# Patient Record
Sex: Male | Born: 1960 | Race: White | Hispanic: No | Marital: Single | Smoking: Former smoker
Health system: Southern US, Community
[De-identification: ages and names within clinical notes are randomized; demographics above are authoritative.]

## PROBLEM LIST (undated history)

## (undated) DIAGNOSIS — M1990 Unspecified osteoarthritis, unspecified site: Secondary | ICD-10-CM

## (undated) DIAGNOSIS — I219 Acute myocardial infarction, unspecified: Secondary | ICD-10-CM

## (undated) DIAGNOSIS — I10 Essential (primary) hypertension: Secondary | ICD-10-CM

## (undated) DIAGNOSIS — I2510 Atherosclerotic heart disease of native coronary artery without angina pectoris: Secondary | ICD-10-CM

## (undated) DIAGNOSIS — F209 Schizophrenia, unspecified: Secondary | ICD-10-CM

## (undated) DIAGNOSIS — R0600 Dyspnea, unspecified: Secondary | ICD-10-CM

## (undated) DIAGNOSIS — E119 Type 2 diabetes mellitus without complications: Secondary | ICD-10-CM

## (undated) DIAGNOSIS — K219 Gastro-esophageal reflux disease without esophagitis: Secondary | ICD-10-CM

## (undated) HISTORY — DX: Unspecified osteoarthritis, unspecified site: M19.90

## (undated) HISTORY — DX: Atherosclerotic heart disease of native coronary artery without angina pectoris: I25.10

## (undated) HISTORY — PX: CORONARY ANGIOPLASTY: SHX604

## (undated) HISTORY — PX: FINGER AMPUTATION: SHX636

## (undated) HISTORY — PX: CARDIAC CATHETERIZATION: SHX172

## (undated) HISTORY — DX: Schizophrenia, unspecified: F20.9

## (undated) HISTORY — DX: Type 2 diabetes mellitus without complications: E11.9

## (undated) HISTORY — DX: Essential (primary) hypertension: I10

---

## 2008-10-24 ENCOUNTER — Emergency Department: Payer: Self-pay | Admitting: Emergency Medicine

## 2008-11-09 ENCOUNTER — Encounter: Payer: Self-pay | Admitting: Internal Medicine

## 2008-11-11 ENCOUNTER — Encounter: Payer: Self-pay | Admitting: Internal Medicine

## 2008-11-21 ENCOUNTER — Inpatient Hospital Stay: Payer: Self-pay | Admitting: Psychiatry

## 2013-09-01 ENCOUNTER — Encounter: Payer: Self-pay | Admitting: Podiatry

## 2013-09-09 ENCOUNTER — Ambulatory Visit: Payer: Self-pay | Admitting: Podiatry

## 2013-09-23 ENCOUNTER — Encounter: Payer: Self-pay | Admitting: Podiatry

## 2013-09-23 ENCOUNTER — Ambulatory Visit (INDEPENDENT_AMBULATORY_CARE_PROVIDER_SITE_OTHER): Payer: Medicare Other | Admitting: Podiatry

## 2013-09-23 ENCOUNTER — Ambulatory Visit (INDEPENDENT_AMBULATORY_CARE_PROVIDER_SITE_OTHER): Payer: Medicare Other

## 2013-09-23 VITALS — BP 158/86 | HR 78 | Resp 16 | Ht 68.0 in | Wt 196.0 lb

## 2013-09-23 DIAGNOSIS — M79609 Pain in unspecified limb: Secondary | ICD-10-CM

## 2013-09-23 DIAGNOSIS — B351 Tinea unguium: Secondary | ICD-10-CM

## 2013-09-23 DIAGNOSIS — M79671 Pain in right foot: Secondary | ICD-10-CM

## 2013-09-23 DIAGNOSIS — M79672 Pain in left foot: Secondary | ICD-10-CM

## 2013-09-23 NOTE — Progress Notes (Signed)
   Subjective:    Patient ID: Michael Wall, male    DOB: November 11, 1960, 53 y.o.   MRN: [id number]  HPI Comments: Need to have my toenails trimmed. Thick and hard and painful. Discoloring of the nails .     Review of Systems  Endocrine:       Diabetic  All other systems reviewed and are negative.       Objective:   Physical Exam: I have reviewed his past mental history medications allergies surgeries social history. Vital signs are stable he is alert. Pulses are strongly palpable bilateral. Neurologic sensorium is intact bilateral. Deep tendon reflexes are intact bilateral. Muscle strength intact bilateral. Orthopedic evaluation demonstrates flexible pes planus bilateral. Cutaneous evaluation demonstrates supple well hydrated cutis no erythema edema cellulitis drainage or odor nails are thick yellow dystrophic clinically mycotic. They're also painful palpation as well as debridement.        Assessment & Plan:  Assessment: Pain in limb secondary to onychomycosis 1 through 5 bilateral. Pes planus bilateral non-complicated.  Plan: Debridement of nails 1 through 5 bilateral is a covered service secondary to pain.

## 2013-09-23 NOTE — Patient Instructions (Signed)
Diabetes and Foot Care Diabetes may cause you to have problems because of poor blood supply (circulation) to your feet and legs. This may cause the skin on your feet to become thinner, break easier, and heal more slowly. Your skin may become dry, and the skin may peel and crack. You may also have nerve damage in your legs and feet causing decreased feeling in them. You may not notice minor injuries to your feet that could lead to infections or more serious problems. Taking care of your feet is one of the most important things you can do for yourself.  HOME CARE INSTRUCTIONS  Wear shoes at all times, even in the house. Do not go barefoot. Bare feet are easily injured.  Check your feet daily for blisters, cuts, and redness. If you cannot see the bottom of your feet, use a mirror or ask someone for help.  Wash your feet with warm water (do not use hot water) and mild soap. Then pat your feet and the areas between your toes until they are completely dry. Do not soak your feet as this can dry your skin.  Apply a moisturizing lotion or petroleum jelly (that does not contain alcohol and is unscented) to the skin on your feet and to dry, brittle toenails. Do not apply lotion between your toes.  Trim your toenails straight across. Do not dig under them or around the cuticle. File the edges of your nails with an emery board or nail file.  Do not cut corns or calluses or try to remove them with medicine.  Wear clean socks or stockings every day. Make sure they are not too tight. Do not wear knee-high stockings since they may decrease blood flow to your legs.  Wear shoes that fit properly and have enough cushioning. To break in new shoes, wear them for just a few hours a day. This prevents you from injuring your feet. Always look in your shoes before you put them on to be sure there are no objects inside.  Do not cross your legs. This may decrease the blood flow to your feet.  If you find a minor scrape,  cut, or break in the skin on your feet, keep it and the skin around it clean and dry. These areas may be cleansed with mild soap and water. Do not cleanse the area with peroxide, alcohol, or iodine.  When you remove an adhesive bandage, be sure not to damage the skin around it.  If you have a wound, look at it several times a day to make sure it is healing.  Do not use heating pads or hot water bottles. They may burn your skin. If you have lost feeling in your feet or legs, you may not know it is happening until it is too late.  Make sure your health care provider performs a complete foot exam at least annually or more often if you have foot problems. Report any cuts, sores, or bruises to your health care provider immediately. SEEK MEDICAL CARE IF:   You have an injury that is not healing.  You have cuts or breaks in the skin.  You have an ingrown nail.  You notice redness on your legs or feet.  You feel burning or tingling in your legs or feet.  You have pain or cramps in your legs and feet.  Your legs or feet are numb.  Your feet always feel cold. SEEK IMMEDIATE MEDICAL CARE IF:   There is increasing redness,   swelling, or pain in or around a wound.  There is a red line that goes up your leg.  Pus is coming from a wound.  You develop a fever or as directed by your health care provider.  You notice a bad smell coming from an ulcer or wound. Document Released: 07/27/2000 Document Revised: 04/01/2013 Document Reviewed: 01/06/2013 [employer] Patient Information 2014 [employer].  

## 2013-12-21 ENCOUNTER — Ambulatory Visit (INDEPENDENT_AMBULATORY_CARE_PROVIDER_SITE_OTHER): Payer: Medicare Other | Admitting: Podiatry

## 2013-12-21 VITALS — BP 115/70 | HR 96 | Resp 16

## 2013-12-21 DIAGNOSIS — M79672 Pain in left foot: Secondary | ICD-10-CM

## 2013-12-21 DIAGNOSIS — B351 Tinea unguium: Secondary | ICD-10-CM

## 2013-12-21 DIAGNOSIS — M79609 Pain in unspecified limb: Secondary | ICD-10-CM

## 2013-12-21 DIAGNOSIS — M79671 Pain in right foot: Secondary | ICD-10-CM

## 2013-12-21 NOTE — Progress Notes (Signed)
He presents today with a chief complaint of painful elongated toenails.  Objective: Nails are thick yellow dystrophic with mycotic painful palpation and debridement.  Assessment: Pain in limb secondary to onychomycosis 1 through 5 bilateral.  Plan: Debridement of nails thickness and length as a covered service secondary to pain.

## 2014-03-22 ENCOUNTER — Encounter: Payer: Self-pay | Admitting: Podiatry

## 2014-03-22 ENCOUNTER — Ambulatory Visit (INDEPENDENT_AMBULATORY_CARE_PROVIDER_SITE_OTHER): Payer: Medicare Other | Admitting: Podiatry

## 2014-03-22 DIAGNOSIS — B351 Tinea unguium: Secondary | ICD-10-CM

## 2014-03-22 DIAGNOSIS — M79609 Pain in unspecified limb: Secondary | ICD-10-CM

## 2014-03-22 DIAGNOSIS — M79676 Pain in unspecified toe(s): Secondary | ICD-10-CM

## 2014-03-22 NOTE — Progress Notes (Signed)
He presents today with his aid with a chief complaint of pain to his toenails.  Objective: Vital signs are stable he is alert and oriented x3. His nails are thick yellow dystrophic with mycotic and painful palpation.  Assessment: Pain in limb second onychomycosis 1 through 5 bilateral.  Plan: Debridement of nails 1 through 5 bilateral.

## 2014-06-21 ENCOUNTER — Ambulatory Visit: Payer: Medicare Other | Admitting: Podiatry

## 2014-07-21 ENCOUNTER — Ambulatory Visit (INDEPENDENT_AMBULATORY_CARE_PROVIDER_SITE_OTHER): Payer: Medicare Other | Admitting: Podiatry

## 2014-07-21 DIAGNOSIS — B351 Tinea unguium: Secondary | ICD-10-CM

## 2014-07-21 DIAGNOSIS — M79676 Pain in unspecified toe(s): Secondary | ICD-10-CM

## 2014-07-21 NOTE — Progress Notes (Signed)
He presents today with his aid with a chief complaint of pain to his toenails.  Objective: Vital signs are stable he is alert and oriented x3. His nails are thick yellow dystrophic with mycotic and painful palpation.  Assessment: Pain in limb second onychomycosis 1 through 5 bilateral.  Plan: Debridement of nails 1 through 5 bilateral. 

## 2014-10-25 ENCOUNTER — Other Ambulatory Visit: Payer: Medicare Other

## 2014-11-03 ENCOUNTER — Ambulatory Visit: Payer: Medicare Other

## 2014-11-11 ENCOUNTER — Ambulatory Visit (INDEPENDENT_AMBULATORY_CARE_PROVIDER_SITE_OTHER): Payer: Medicare Other | Admitting: Podiatry

## 2014-11-11 ENCOUNTER — Encounter: Payer: Self-pay | Admitting: Podiatry

## 2014-11-11 DIAGNOSIS — M79676 Pain in unspecified toe(s): Secondary | ICD-10-CM | POA: Diagnosis not present

## 2014-11-11 DIAGNOSIS — B351 Tinea unguium: Secondary | ICD-10-CM | POA: Diagnosis not present

## 2014-11-11 NOTE — Patient Instructions (Signed)
Diabetes and Foot Care Diabetes may cause you to have problems because of poor blood supply (circulation) to your feet and legs. This may cause the skin on your feet to become thinner, break easier, and heal more slowly. Your skin may become dry, and the skin may peel and crack. You may also have nerve damage in your legs and feet causing decreased feeling in them. You may not notice minor injuries to your feet that could lead to infections or more serious problems. Taking care of your feet is one of the most important things you can do for yourself.  HOME CARE INSTRUCTIONS  Wear shoes at all times, even in the house. Do not go barefoot. Bare feet are easily injured.  Check your feet daily for blisters, cuts, and redness. If you cannot see the bottom of your feet, use a mirror or ask someone for help.  Wash your feet with warm water (do not use hot water) and mild soap. Then pat your feet and the areas between your toes until they are completely dry. Do not soak your feet as this can dry your skin.  Apply a moisturizing lotion or petroleum jelly (that does not contain alcohol and is unscented) to the skin on your feet and to dry, brittle toenails. Do not apply lotion between your toes.  Trim your toenails straight across. Do not dig under them or around the cuticle. File the edges of your nails with an emery board or nail file.  Do not cut corns or calluses or try to remove them with medicine.  Wear clean socks or stockings every day. Make sure they are not too tight. Do not wear knee-high stockings since they may decrease blood flow to your legs.  Wear shoes that fit properly and have enough cushioning. To break in new shoes, wear them for just a few hours a day. This prevents you from injuring your feet. Always look in your shoes before you put them on to be sure there are no objects inside.  Do not cross your legs. This may decrease the blood flow to your feet.  If you find a minor scrape,  cut, or break in the skin on your feet, keep it and the skin around it clean and dry. These areas may be cleansed with mild soap and water. Do not cleanse the area with peroxide, alcohol, or iodine.  When you remove an adhesive bandage, be sure not to damage the skin around it.  If you have a wound, look at it several times a day to make sure it is healing.  Do not use heating pads or hot water bottles. They may burn your skin. If you have lost feeling in your feet or legs, you may not know it is happening until it is too late.  Make sure your health care provider performs a complete foot exam at least annually or more often if you have foot problems. Report any cuts, sores, or bruises to your health care provider immediately. SEEK MEDICAL CARE IF:   You have an injury that is not healing.  You have cuts or breaks in the skin.  You have an ingrown nail.  You notice redness on your legs or feet.  You feel burning or tingling in your legs or feet.  You have pain or cramps in your legs and feet.  Your legs or feet are numb.  Your feet always feel cold. SEEK IMMEDIATE MEDICAL CARE IF:   There is increasing redness,   swelling, or pain in or around a wound.  There is a red line that goes up your leg.  Pus is coming from a wound.  You develop a fever or as directed by your health care provider.  You notice a bad smell coming from an ulcer or wound. Document Released: 07/27/2000 Document Revised: 04/01/2013 Document Reviewed: 01/06/2013 [employer] Patient Information 2015 [employer]. This information is not intended to replace advice given to you by your health care provider. Make sure you discuss any questions you have with your health care provider.  

## 2014-11-11 NOTE — Progress Notes (Signed)
Patient ID: Michael Wall, male   DOB: Apr 24, 1961, 54 y.o.   MRN: [id number]  Subjective: 54 y.o.-year-old male returns the office today for painful, elongated, thickened toenails which he is unable to do himself. Denies any redness or drainage around the nails. Denies any acute changes since last appointment and no new complaints today. Denies any systemic complaints such as fevers, chills, nausea, vomiting.   Objective: AAO 3, NAD DP/PT pulses palpable, CRT less than 3 seconds Protective sensation intact with Simms Weinstein monofilament, Achilles tendon reflex intact.  Nails hypertrophic, dystrophic, elongated, brittle, discolored 10. There is tenderness overlying the nails 1-5 bilaterally. There is no surrounding erythema or drainage along the nail sites. No open lesions or pre-ulcerative lesions are identified.  Hammertoe deformities present No other areas of tenderness bilateral lower extremities. No overlying edema, erythema, increased warmth. No pain with calf compression, swelling, warmth, erythema.  Assessment: Patient presents with symptomatic onychomycosis  Plan: -Treatment options including alternatives, risks, complications were discussed -Nails sharply debrided 10 without complication/bleeding. -Discussed daily foot inspection. If there are any changes, to call the office immediately.  -Follow-up in 3 months or sooner if any problems are to arise. In the meantime, encouraged to call the office with any questions, concerns, changes symptoms.

## 2015-02-03 ENCOUNTER — Ambulatory Visit (INDEPENDENT_AMBULATORY_CARE_PROVIDER_SITE_OTHER): Payer: Medicare Other | Admitting: Podiatry

## 2015-02-03 DIAGNOSIS — M79676 Pain in unspecified toe(s): Secondary | ICD-10-CM

## 2015-02-03 DIAGNOSIS — B351 Tinea unguium: Secondary | ICD-10-CM | POA: Diagnosis not present

## 2015-02-03 NOTE — Progress Notes (Signed)
Subjective: 54 y.o.-year-old male returns the office today for painful, elongated, thickened toenails which he is unable to trim himself. Denies any redness or drainage around the nails. Denies any acute changes since last appointment and no new complaints today. Denies any systemic complaints such as fevers, chills, nausea, vomiting.   Objective: AAO 3, NAD DP/PT pulses palpable, CRT less than 3 seconds.  Nails hypertrophic, dystrophic, elongated, brittle, discolored 10. There is tenderness overlying the nails 1-5 bilaterally. There is no surrounding erythema or drainage along the nail sites. On the dorsal aspect of  The left hallux there is a superficial abrasion without any drainage, surrounding erythema, or other signs of infection.  No open lesions or pre-ulcerative lesions are identified. No other areas of tenderness bilateral lower extremities. No overlying edema, erythema, increased warmth. No pain with calf compression, swelling, warmth, erythema.  Assessment: Patient presents with symptomatic onychomycosis; abrasion left hallux  Plan: -Treatment options including alternatives, risks, complications were discussed -Nails sharply debrided 10 without complication/bleeding. -Recommend antibiotic ointment and a band-aid daily to the abrasion. If there are any signs/symptoms of infection to call the office immediately. Follow-up in 2 weeks if not healed.  -Discussed daily foot inspection. If there are any changes, to call the office immediately.  -Follow-up in 3 months or sooner if any problems are to arise. In the meantime, encouraged to call the office with any questions, concerns, changes symptoms.

## 2015-05-10 ENCOUNTER — Ambulatory Visit (INDEPENDENT_AMBULATORY_CARE_PROVIDER_SITE_OTHER): Payer: Medicare Other | Admitting: Podiatry

## 2015-05-10 DIAGNOSIS — M79676 Pain in unspecified toe(s): Secondary | ICD-10-CM

## 2015-05-10 DIAGNOSIS — B351 Tinea unguium: Secondary | ICD-10-CM | POA: Diagnosis not present

## 2015-05-10 NOTE — Progress Notes (Signed)
Subjective: 54 y.o.-year-old male returns the office today for painful, elongated, thickened toenails which he is unable to trim himself. Denies any redness or drainage around the nails. Denies any acute changes since last appointment and no new complaints today. Denies any systemic complaints such as fevers, chills, nausea, vomiting.   Objective: AAO 3, NAD DP/PT pulses palpable, CRT less than 3 seconds.  Nails hypertrophic, dystrophic, elongated, brittle, discolored 10. There is tenderness overlying the nails 1-5 bilaterally. There is no surrounding erythema or drainage along the nail sites.  No open lesions or pre-ulcerative lesions are identified. No other areas of tenderness bilateral lower extremities. No overlying edema, erythema, increased warmth. No pain with calf compression, swelling, warmth, erythema.  Assessment: Patient presents with symptomatic onychomycosis; abrasion left hallux  Plan: -Treatment options including alternatives, risks, complications were discussed -Nails sharply debrided 10 without complication/bleeding. -Discussed daily foot inspection. If there are any changes, to call the office immediately.  -Follow-up in 3 months or sooner if any problems are to arise. In the meantime, encouraged to call the office with any questions, concerns, changes symptoms.   Ovid Curd, DPM

## 2015-08-11 ENCOUNTER — Ambulatory Visit: Payer: Medicare Other

## 2015-08-12 ENCOUNTER — Encounter: Payer: Self-pay | Admitting: Sports Medicine

## 2015-08-12 ENCOUNTER — Ambulatory Visit (INDEPENDENT_AMBULATORY_CARE_PROVIDER_SITE_OTHER): Payer: Medicare Other | Admitting: Sports Medicine

## 2015-08-12 DIAGNOSIS — M79676 Pain in unspecified toe(s): Secondary | ICD-10-CM

## 2015-08-12 DIAGNOSIS — B351 Tinea unguium: Secondary | ICD-10-CM

## 2015-08-12 DIAGNOSIS — E119 Type 2 diabetes mellitus without complications: Secondary | ICD-10-CM

## 2015-08-12 NOTE — Progress Notes (Signed)
Patient ID: Michael Wall, male   DOB: 1961/04/14, 54 y.o.   MRN: [id number] Subjective: Michael Wall is a 54 y.o. male patient with history of type 2 diabetes who presents to office today complaining of long, painful nails  while ambulating in shoes; unable to trim. Patient states that the glucose reading this morning was 118 mg/dl. Patient denies any new changes in medication or new problems. Patient denies any new cramping, numbness, burning or tingling in the legs.  There are no active problems to display for this patient.  Current Outpatient Prescriptions on File Prior to Visit  Medication Sig Dispense Refill  . acetaminophen (TYLENOL) 500 MG tablet Take 500 mg by mouth every 6 (six) hours as needed.    Marland Kitchen aspirin 81 MG EC tablet Take 81 mg by mouth daily. Swallow whole.    . benztropine (COGENTIN) 2 MG tablet Take 2 mg by mouth 2 (two) times daily.    . clopidogrel (PLAVIX) 75 MG tablet Take 75 mg by mouth daily with breakfast.    . diltiazem (CARDIZEM) 60 MG tablet Take 60 mg by mouth 4 (four) times daily.    Marland Kitchen glipiZIDE (GLUCOTROL) 10 MG tablet Take 10 mg by mouth daily before breakfast.    . glucose blood test strip 1 each by Other route as needed for other. Use as instructed    . haloperidol (HALDOL) 0.5 MG tablet Take 0.5 mg by mouth 2 (two) times daily.    . haloperidol (HALDOL) 5 MG tablet Take 5 mg by mouth 2 (two) times daily.    Marland Kitchen lisinopril (PRINIVIL,ZESTRIL) 20 MG tablet Take 20 mg by mouth daily.    . metFORMIN (GLUCOPHAGE-XR) 500 MG 24 hr tablet     . metoprolol (LOPRESSOR) 100 MG tablet     . metoprolol succinate (TOPROL-XL) 100 MG 24 hr tablet Take 100 mg by mouth daily. Take with or immediately following a meal.    . mirtazapine (REMERON) 30 MG tablet Take 30 mg by mouth at bedtime.    . Multiple Vitamin (MULTIVITAMIN) tablet Take 1 tablet by mouth daily.    Marland Kitchen omeprazole (PRILOSEC) 40 MG capsule     . QUEtiapine (SEROQUEL) 25 MG tablet Take 25 mg by mouth at  bedtime.    . saxagliptin HCl (ONGLYZA) 5 MG TABS tablet Take by mouth daily.    . simvastatin (ZOCOR) 20 MG tablet Take 20 mg by mouth daily.    Rolene Arbour BOWEL PREP SOLN      No current facility-administered medications on file prior to visit.   No Known Allergies  Labs: HEMOGLOBIN A1C- No recent labs on file  Objective: General: Patient is awake, alert, and oriented x 3 and in no acute distress.  Integument: Skin is warm, dry and supple bilateral. Nails are tender, long, thickened and  dystrophic with subungual debris, consistent with onychomycosis, 1-5 bilateral. No signs of infection. No open lesions or preulcerative lesions present bilateral. Remaining integument unremarkable.  Vasculature:  Dorsalis Pedis pulse 2/4 bilateral. Posterior Tibial pulse  1/4 bilateral.  Capillary fill time <3 sec 1-5 bilateral. Positive hair growth to the level of the digits. Temperature gradient within normal limits. No varicosities present bilateral. No edema present bilateral.   Neurology: The patient has intact sensation measured with a 5.07/10g Semmes Weinstein Monofilament at all pedal sites bilateral . Vibratory sensation intact bilateral with tuning fork. No Babinski sign present bilateral.   Musculoskeletal: Asymptomatic mild hammertoe gross pedal deformities noted bilateral. Muscular strength  5/5 in all lower extremity muscular groups bilateral without pain or limitation on range of motion . No tenderness with calf compression bilateral.  Assessment and Plan: Problem List Items Addressed This Visit    None    Visit Diagnoses    Dermatophytosis of nail    -  Primary    Pain of toe, unspecified laterality        Diabetes mellitus without complication (HCC)          -Examined patient. -Discussed and educated patient on diabetic foot care, especially with  regards to the vascular, neurological and musculoskeletal systems.  -Stressed the importance of good glycemic control and the  detriment of not  controlling glucose levels in relation to the foot. -Mechanically debrided all nails 1-5 bilateral using sterile nail nipper and filed with dremel without incident  -Answered all patient questions -Patient to return as needed or in 3 months for at risk foot care -Patient advised to call the office if any problems or questions arise in the meantime.  Asencion Islam, DPM

## 2015-11-11 ENCOUNTER — Ambulatory Visit (INDEPENDENT_AMBULATORY_CARE_PROVIDER_SITE_OTHER): Payer: Medicare Other | Admitting: Sports Medicine

## 2015-11-11 ENCOUNTER — Encounter: Payer: Self-pay | Admitting: Sports Medicine

## 2015-11-11 DIAGNOSIS — B351 Tinea unguium: Secondary | ICD-10-CM | POA: Diagnosis not present

## 2015-11-11 DIAGNOSIS — M79676 Pain in unspecified toe(s): Secondary | ICD-10-CM | POA: Diagnosis not present

## 2015-11-11 DIAGNOSIS — E119 Type 2 diabetes mellitus without complications: Secondary | ICD-10-CM | POA: Diagnosis not present

## 2015-11-11 NOTE — Progress Notes (Signed)
Patient ID: Michael Wall, male   DOB: October 26, 1960, 55 y.o.   MRN: [id number]  Subjective: Michael Wall is a 55 y.o. male patient with history of type 2 diabetes who presents to office today complaining of long, painful nails  while ambulating in shoes; unable to trim. Patient states that the glucose reading this morning was recorded at his living facility but does not recall be number. Patient denies any new changes in medication or new problems. Patient denies any new cramping, numbness, burning or tingling in the legs.  Patient is assisted by caregiver from his living facility.   There are no active problems to display for this patient.  Current Outpatient Prescriptions on File Prior to Visit  Medication Sig Dispense Refill  . acetaminophen (TYLENOL) 500 MG tablet Take 500 mg by mouth every 6 (six) hours as needed.    Marland Kitchen aspirin 81 MG EC tablet Take 81 mg by mouth daily. Swallow whole.    . benztropine (COGENTIN) 2 MG tablet Take 2 mg by mouth 2 (two) times daily.    . clopidogrel (PLAVIX) 75 MG tablet Take 75 mg by mouth daily with breakfast.    . diltiazem (CARDIZEM) 60 MG tablet Take 60 mg by mouth 4 (four) times daily.    Marland Kitchen glipiZIDE (GLUCOTROL) 10 MG tablet Take 10 mg by mouth daily before breakfast.    . glucose blood test strip 1 each by Other route as needed for other. Use as instructed    . haloperidol (HALDOL) 0.5 MG tablet Take 0.5 mg by mouth 2 (two) times daily.    . haloperidol (HALDOL) 5 MG tablet Take 5 mg by mouth 2 (two) times daily.    Marland Kitchen lisinopril (PRINIVIL,ZESTRIL) 20 MG tablet Take 20 mg by mouth daily.    . metFORMIN (GLUCOPHAGE-XR) 500 MG 24 hr tablet     . metoprolol (LOPRESSOR) 100 MG tablet     . metoprolol succinate (TOPROL-XL) 100 MG 24 hr tablet Take 100 mg by mouth daily. Take with or immediately following a meal.    . mirtazapine (REMERON) 30 MG tablet Take 30 mg by mouth at bedtime.    . Multiple Vitamin (MULTIVITAMIN) tablet Take 1 tablet by mouth  daily.    Marland Kitchen omeprazole (PRILOSEC) 40 MG capsule     . QUEtiapine (SEROQUEL) 25 MG tablet Take 25 mg by mouth at bedtime.    . saxagliptin HCl (ONGLYZA) 5 MG TABS tablet Take by mouth daily.    . simvastatin (ZOCOR) 20 MG tablet Take 20 mg by mouth daily.    Rolene Arbour BOWEL PREP SOLN      No current facility-administered medications on file prior to visit.   No Known Allergies  Labs: HEMOGLOBIN A1C- No recent labs on file  Objective: General: Patient is awake, alert, and oriented x 3 and in no acute distress.  Integument: Skin is warm, dry and supple bilateral. Nails are tender, long, thickened and  dystrophic with subungual debris, consistent with onychomycosis, 1-5 bilateral. No signs of infection. No open lesions or preulcerative lesions present bilateral. Remaining integument unremarkable.  Vasculature:  Dorsalis Pedis pulse 2/4 bilateral. Posterior Tibial pulse  1/4 bilateral.  Capillary fill time <3 sec 1-5 bilateral. Positive hair growth to the level of the digits. Temperature gradient within normal limits. No varicosities present bilateral. No edema present bilateral.   Neurology: The patient has intact sensation measured with a 5.07/10g Semmes Weinstein Monofilament at all pedal sites bilateral . Vibratory sensation intact bilateral  with tuning fork. No Babinski sign present bilateral.   Musculoskeletal: Asymptomatic mild hammertoe gross pedal deformities noted bilateral. Muscular strength 5/5 in all lower extremity muscular groups bilateral without pain or limitation on range of motion . No tenderness with calf compression bilateral.  Assessment and Plan: Problem List Items Addressed This Visit    None    Visit Diagnoses    Dermatophytosis of nail    -  Primary    Pain of toe, unspecified laterality        Diabetes mellitus without complication (HCC)          -Examined patient. -Discussed and educated patient on diabetic foot care, especially with  regards to the  vascular, neurological and musculoskeletal systems.  -Stressed the importance of good glycemic control and the detriment of not  controlling glucose levels in relation to the foot. -Mechanically debrided all nails 1-5 bilateral using sterile nail nipper and filed with dremel without incident -Recommend good supportive shoes daily for foot type  -Answered all patient questions -Patient to return in 3 months for at risk foot care -Patient advised to call the office if any problems or questions arise in the meantime.  Asencion Islam, DPM

## 2016-02-10 ENCOUNTER — Ambulatory Visit (INDEPENDENT_AMBULATORY_CARE_PROVIDER_SITE_OTHER): Payer: Medicare Other | Admitting: Sports Medicine

## 2016-02-10 ENCOUNTER — Encounter: Payer: Self-pay | Admitting: Sports Medicine

## 2016-02-10 DIAGNOSIS — M79676 Pain in unspecified toe(s): Secondary | ICD-10-CM | POA: Diagnosis not present

## 2016-02-10 DIAGNOSIS — B351 Tinea unguium: Secondary | ICD-10-CM

## 2016-02-10 DIAGNOSIS — E119 Type 2 diabetes mellitus without complications: Secondary | ICD-10-CM | POA: Diagnosis not present

## 2016-02-10 NOTE — Progress Notes (Signed)
Patient ID: Michael Wall, male   DOB: 07-24-1961, 55 y.o.   MRN: [id number]   Subjective: Michael Wall is a 55 y.o. male patient with history of type 2 diabetes who presents to office today complaining of long, painful nails  while ambulating in shoes; unable to trim. Patient states that the glucose reading this morning was recorded at his living facility but does not recall be number. Patient denies any new changes in medication or new problems. Patient denies any new cramping, numbness, burning or tingling in the legs.  Patient is assisted by caregiver from his living facility who is waiting for him in the lobby.   There are no active problems to display for this patient.  Current Outpatient Prescriptions on File Prior to Visit  Medication Sig Dispense Refill  . acetaminophen (TYLENOL) 500 MG tablet Take 500 mg by mouth every 6 (six) hours as needed.    Marland Kitchen aspirin 81 MG EC tablet Take 81 mg by mouth daily. Swallow whole.    . benztropine (COGENTIN) 2 MG tablet Take 2 mg by mouth 2 (two) times daily.    . clopidogrel (PLAVIX) 75 MG tablet Take 75 mg by mouth daily with breakfast.    . diltiazem (CARDIZEM) 60 MG tablet Take 60 mg by mouth 4 (four) times daily.    Marland Kitchen glipiZIDE (GLUCOTROL) 10 MG tablet Take 10 mg by mouth daily before breakfast.    . glucose blood test strip 1 each by Other route as needed for other. Use as instructed    . haloperidol (HALDOL) 0.5 MG tablet Take 0.5 mg by mouth 2 (two) times daily.    . haloperidol (HALDOL) 5 MG tablet Take 5 mg by mouth 2 (two) times daily.    Marland Kitchen lisinopril (PRINIVIL,ZESTRIL) 20 MG tablet Take 20 mg by mouth daily.    . metFORMIN (GLUCOPHAGE-XR) 500 MG 24 hr tablet     . metoprolol (LOPRESSOR) 100 MG tablet     . metoprolol succinate (TOPROL-XL) 100 MG 24 hr tablet Take 100 mg by mouth daily. Take with or immediately following a meal.    . mirtazapine (REMERON) 30 MG tablet Take 30 mg by mouth at bedtime.    . Multiple Vitamin  (MULTIVITAMIN) tablet Take 1 tablet by mouth daily.    Marland Kitchen omeprazole (PRILOSEC) 40 MG capsule     . QUEtiapine (SEROQUEL) 25 MG tablet Take 25 mg by mouth at bedtime.    . saxagliptin HCl (ONGLYZA) 5 MG TABS tablet Take by mouth daily.    . simvastatin (ZOCOR) 20 MG tablet Take 20 mg by mouth daily.    Rolene Arbour BOWEL PREP SOLN      No current facility-administered medications on file prior to visit.   No Known Allergies  Labs: HEMOGLOBIN A1C- No recent labs on file  Objective: General: Patient is awake, alert, and oriented x 3 and in no acute distress.  Integument: Skin is warm, dry and supple bilateral. Nails are tender, long, thickened and  dystrophic with subungual debris, consistent with onychomycosis, 1-5 bilateral. No signs of infection. No open lesions or preulcerative lesions present bilateral. Remaining integument unremarkable.  Vasculature:  Dorsalis Pedis pulse 2/4 bilateral. Posterior Tibial pulse  1/4 bilateral.  Capillary fill time <3 sec 1-5 bilateral. Positive hair growth to the level of the digits. Temperature gradient within normal limits. No varicosities present bilateral. No edema present bilateral.   Neurology: The patient has intact sensation measured with a 5.07/10g Semmes Weinstein Monofilament at  all pedal sites bilateral . Vibratory sensation intact bilateral with tuning fork. No Babinski sign present bilateral.   Musculoskeletal: Asymptomatic mild hammertoe gross pedal deformities noted bilateral. Muscular strength 5/5 in all lower extremity muscular groups bilateral without pain or limitation on range of motion . No tenderness with calf compression bilateral.  Assessment and Plan: Problem List Items Addressed This Visit    None    Visit Diagnoses    Dermatophytosis of nail    -  Primary    Pain of toe, unspecified laterality        Diabetes mellitus without complication (HCC)          -Examined patient. -Discussed and educated patient on diabetic foot  care, especially with  regards to the vascular, neurological and musculoskeletal systems.  -Stressed the importance of good glycemic control and the detriment of not  controlling glucose levels in relation to the foot. -Mechanically debrided all nails 1-5 bilateral using sterile nail nipper and filed with dremel without incident -Recommend good supportive shoes daily for foot type  -Answered all patient questions -Patient to return in 3 months for at risk foot care -Patient advised to call the office if any problems or questions arise in the meantime.  Asencion Islam, DPM

## 2016-06-29 ENCOUNTER — Ambulatory Visit (INDEPENDENT_AMBULATORY_CARE_PROVIDER_SITE_OTHER): Payer: Medicare Other | Admitting: Podiatry

## 2016-06-29 ENCOUNTER — Encounter: Payer: Self-pay | Admitting: Podiatry

## 2016-06-29 DIAGNOSIS — L608 Other nail disorders: Secondary | ICD-10-CM

## 2016-06-29 DIAGNOSIS — M79609 Pain in unspecified limb: Secondary | ICD-10-CM | POA: Diagnosis not present

## 2016-06-29 DIAGNOSIS — L603 Nail dystrophy: Secondary | ICD-10-CM

## 2016-06-29 DIAGNOSIS — B351 Tinea unguium: Secondary | ICD-10-CM | POA: Diagnosis not present

## 2016-06-29 DIAGNOSIS — E0843 Diabetes mellitus due to underlying condition with diabetic autonomic (poly)neuropathy: Secondary | ICD-10-CM

## 2016-07-15 NOTE — Progress Notes (Signed)
SUBJECTIVE Patient with a history of diabetes mellitus presents to office today complaining of elongated, thickened nails. Pain while ambulating in shoes. Patient is unable to trim their own nails.   No Known Allergies  OBJECTIVE General Patient is awake, alert, and oriented x 3 and in no acute distress. Derm Skin is dry and supple bilateral. Negative open lesions or macerations. Remaining integument unremarkable. Nails are tender, long, thickened and dystrophic with subungual debris, consistent with onychomycosis, 1-5 bilateral. No signs of infection noted. Vasc  DP and PT pedal pulses palpable bilaterally. Temperature gradient within normal limits.  Neuro Epicritic and protective threshold sensation diminished bilaterally.  Musculoskeletal Exam No symptomatic pedal deformities noted bilateral. Muscular strength within normal limits.  ASSESSMENT 1. Diabetes Mellitus w/ peripheral neuropathy 2. Onychomycosis of nail due to dermatophyte bilateral 3. Pain in foot bilateral  PLAN OF CARE 1. Patient evaluated today. 2. Instructed to maintain good pedal hygiene and foot care. Stressed importance of controlling blood sugar.  3. Mechanical debridement of nails 1-5 bilaterally performed using a nail nipper. Filed with dremel without incident.  4. Return to clinic in 3 mos.    Taisa Deloria M Aldine Grainger, DPM   

## 2016-10-02 ENCOUNTER — Encounter: Payer: Self-pay | Admitting: Podiatry

## 2016-10-02 ENCOUNTER — Ambulatory Visit (INDEPENDENT_AMBULATORY_CARE_PROVIDER_SITE_OTHER): Payer: Medicare Other | Admitting: Podiatry

## 2016-10-02 DIAGNOSIS — L608 Other nail disorders: Secondary | ICD-10-CM | POA: Diagnosis not present

## 2016-10-02 DIAGNOSIS — L603 Nail dystrophy: Secondary | ICD-10-CM | POA: Diagnosis not present

## 2016-10-02 DIAGNOSIS — E0843 Diabetes mellitus due to underlying condition with diabetic autonomic (poly)neuropathy: Secondary | ICD-10-CM

## 2016-10-02 DIAGNOSIS — M79609 Pain in unspecified limb: Secondary | ICD-10-CM | POA: Diagnosis not present

## 2016-10-02 DIAGNOSIS — B351 Tinea unguium: Secondary | ICD-10-CM | POA: Diagnosis not present

## 2016-10-02 NOTE — Progress Notes (Signed)
   SUBJECTIVE Patient with a history of diabetes mellitus presents to office today complaining of elongated, thickened nails. Pain while ambulating in shoes. Patient is unable to trim their own nails.   OBJECTIVE General Patient is awake, alert, and oriented x 3 and in no acute distress. Derm Skin is dry and supple bilateral. Negative open lesions or macerations. Remaining integument unremarkable. Nails are tender, long, thickened and dystrophic with subungual debris, consistent with onychomycosis, 1-5 bilateral. No signs of infection noted. Vasc  DP and PT pedal pulses palpable bilaterally. Temperature gradient within normal limits.  Neuro Epicritic and protective threshold sensation diminished bilaterally.  Musculoskeletal Exam No symptomatic pedal deformities noted bilateral. Muscular strength within normal limits.  ASSESSMENT 1. Diabetes Mellitus w/ peripheral neuropathy 2. Onychomycosis of nail due to dermatophyte bilateral 3. Pain in foot bilateral  PLAN OF CARE 1. Patient evaluated today. 2. Instructed to maintain good pedal hygiene and foot care. Stressed importance of controlling blood sugar.  3. Mechanical debridement of nails 1-5 bilaterally performed using a nail nipper. Filed with dremel without incident.  4. Return to clinic in 3 mos.     Keshonna Valvo M. Cameran Pettey, DPM [facility]  Dr. Helder Crisafulli M. Emelyn Roen, DPM    [address]                                        [place], [place] [address]                Office [phone]  Fax [phone]       

## 2017-01-10 ENCOUNTER — Ambulatory Visit (INDEPENDENT_AMBULATORY_CARE_PROVIDER_SITE_OTHER): Payer: Medicare Other | Admitting: Podiatry

## 2017-01-10 ENCOUNTER — Encounter: Payer: Self-pay | Admitting: Podiatry

## 2017-01-10 DIAGNOSIS — E119 Type 2 diabetes mellitus without complications: Secondary | ICD-10-CM

## 2017-01-10 DIAGNOSIS — B351 Tinea unguium: Secondary | ICD-10-CM | POA: Diagnosis not present

## 2017-01-10 DIAGNOSIS — M79609 Pain in unspecified limb: Secondary | ICD-10-CM

## 2017-01-10 NOTE — Progress Notes (Signed)
Complaint:  Visit Type: Patient returns to my office for continued preventative foot care services. Complaint: Patient states" my nails have grown long and thick and become painful to walk and wear shoes" Patient has been diagnosed with DM with no foot complications. The patient presents for preventative foot care services. No changes to ROS  Podiatric Exam: Vascular: dorsalis pedis and posterior tibial pulses are palpable bilateral. Capillary return is immediate. Temperature gradient is WNL. Skin turgor WNL  Sensorium: Diminished  Semmes Weinstein monofilament test. Normal tactile sensation bilaterally. Nail Exam: Pt has thick disfigured discolored nails with subungual debris noted bilateral entire nail hallux through fifth toenails Ulcer Exam: There is no evidence of ulcer or pre-ulcerative changes or infection. Orthopedic Exam: Muscle tone and strength are WNL. No limitations in general ROM. No crepitus or effusions noted. Foot type and digits show no abnormalities. Bony prominences are unremarkable. Skin: No Porokeratosis. No infection or ulcers  Diagnosis:  Onychomycosis, , Pain in right toe, pain in left toes  Treatment & Plan Procedures and Treatment: Consent by patient was obtained for treatment procedures. The patient understood the discussion of treatment and procedures well. All questions were answered thoroughly reviewed. Debridement of mycotic and hypertrophic toenails, 1 through 5 bilateral and clearing of subungual debris. No ulceration, no infection noted.  Return Visit-Office Procedure: Patient instructed to return to the office for a follow up visit 3 months for continued evaluation and treatment.    Hailley Byers DPM 

## 2017-02-25 DIAGNOSIS — R0600 Dyspnea, unspecified: Secondary | ICD-10-CM | POA: Insufficient documentation

## 2017-02-25 DIAGNOSIS — K219 Gastro-esophageal reflux disease without esophagitis: Secondary | ICD-10-CM | POA: Insufficient documentation

## 2017-02-25 DIAGNOSIS — Z8679 Personal history of other diseases of the circulatory system: Secondary | ICD-10-CM | POA: Insufficient documentation

## 2017-02-25 DIAGNOSIS — Z8639 Personal history of other endocrine, nutritional and metabolic disease: Secondary | ICD-10-CM | POA: Insufficient documentation

## 2017-02-25 DIAGNOSIS — I219 Acute myocardial infarction, unspecified: Secondary | ICD-10-CM | POA: Insufficient documentation

## 2017-04-18 ENCOUNTER — Encounter: Payer: Self-pay | Admitting: Podiatry

## 2017-04-18 ENCOUNTER — Ambulatory Visit (INDEPENDENT_AMBULATORY_CARE_PROVIDER_SITE_OTHER): Payer: Medicare Other | Admitting: Podiatry

## 2017-04-18 DIAGNOSIS — B351 Tinea unguium: Secondary | ICD-10-CM

## 2017-04-18 DIAGNOSIS — M79609 Pain in unspecified limb: Secondary | ICD-10-CM

## 2017-04-18 DIAGNOSIS — E119 Type 2 diabetes mellitus without complications: Secondary | ICD-10-CM

## 2017-04-18 NOTE — Progress Notes (Signed)
Complaint:  Visit Type: Patient returns to my office for continued preventative foot care services. Complaint: Patient states" my nails have grown long and thick and become painful to walk and wear shoes" Patient has been diagnosed with DM with no foot complications. The patient presents for preventative foot care services. No changes to ROS  Podiatric Exam: Vascular: dorsalis pedis and posterior tibial pulses are palpable bilateral. Capillary return is immediate. Temperature gradient is WNL. Skin turgor WNL  Sensorium: Diminished  Semmes Weinstein monofilament test. Normal tactile sensation bilaterally. Nail Exam: Pt has thick disfigured discolored nails with subungual debris noted bilateral entire nail hallux through fifth toenails Ulcer Exam: There is no evidence of ulcer or pre-ulcerative changes or infection. Orthopedic Exam: Muscle tone and strength are WNL. No limitations in general ROM. No crepitus or effusions noted. Foot type and digits show no abnormalities. Bony prominences are unremarkable. Skin: No Porokeratosis. No infection or ulcers  Diagnosis:  Onychomycosis, , Pain in right toe, pain in left toes  Treatment & Plan Procedures and Treatment: Consent by patient was obtained for treatment procedures. The patient understood the discussion of treatment and procedures well. All questions were answered thoroughly reviewed. Debridement of mycotic and hypertrophic toenails, 1 through 5 bilateral and clearing of subungual debris. No ulceration, no infection noted.  Return Visit-Office Procedure: Patient instructed to return to the office for a follow up visit 3 months for continued evaluation and treatment.    Mattison Golay DPM 

## 2017-05-24 ENCOUNTER — Encounter: Payer: Self-pay | Admitting: *Deleted

## 2017-05-27 ENCOUNTER — Encounter: Payer: Self-pay | Admitting: *Deleted

## 2017-05-27 ENCOUNTER — Ambulatory Visit: Payer: Medicare Other | Admitting: Anesthesiology

## 2017-05-27 ENCOUNTER — Encounter
Admission: RE | Disposition: A | Discharge status: Home / Self Care | Payer: Self-pay | Source: Ambulatory Visit | Attending: Unknown Physician Specialty

## 2017-05-27 ENCOUNTER — Ambulatory Visit
Admission: RE | Admit: 2017-05-27 | Discharge: 2017-05-27 | Disposition: A | Discharge status: Home / Self Care | Payer: Medicare Other | Source: Ambulatory Visit | Attending: Unknown Physician Specialty | Admitting: Unknown Physician Specialty

## 2017-05-27 DIAGNOSIS — Z7984 Long term (current) use of oral hypoglycemic drugs: Secondary | ICD-10-CM | POA: Insufficient documentation

## 2017-05-27 DIAGNOSIS — I252 Old myocardial infarction: Secondary | ICD-10-CM | POA: Insufficient documentation

## 2017-05-27 DIAGNOSIS — I10 Essential (primary) hypertension: Secondary | ICD-10-CM | POA: Insufficient documentation

## 2017-05-27 DIAGNOSIS — M1990 Unspecified osteoarthritis, unspecified site: Secondary | ICD-10-CM | POA: Insufficient documentation

## 2017-05-27 DIAGNOSIS — K219 Gastro-esophageal reflux disease without esophagitis: Secondary | ICD-10-CM | POA: Insufficient documentation

## 2017-05-27 DIAGNOSIS — E119 Type 2 diabetes mellitus without complications: Secondary | ICD-10-CM | POA: Insufficient documentation

## 2017-05-27 DIAGNOSIS — Z87891 Personal history of nicotine dependence: Secondary | ICD-10-CM | POA: Insufficient documentation

## 2017-05-27 DIAGNOSIS — I2510 Atherosclerotic heart disease of native coronary artery without angina pectoris: Secondary | ICD-10-CM | POA: Insufficient documentation

## 2017-05-27 DIAGNOSIS — Z79899 Other long term (current) drug therapy: Secondary | ICD-10-CM | POA: Insufficient documentation

## 2017-05-27 DIAGNOSIS — Z7982 Long term (current) use of aspirin: Secondary | ICD-10-CM | POA: Diagnosis not present

## 2017-05-27 DIAGNOSIS — R195 Other fecal abnormalities: Secondary | ICD-10-CM | POA: Diagnosis present

## 2017-05-27 DIAGNOSIS — F209 Schizophrenia, unspecified: Secondary | ICD-10-CM | POA: Diagnosis not present

## 2017-05-27 HISTORY — DX: Acute myocardial infarction, unspecified: I21.9

## 2017-05-27 HISTORY — DX: Gastro-esophageal reflux disease without esophagitis: K21.9

## 2017-05-27 HISTORY — PX: COLONOSCOPY WITH PROPOFOL: SHX5780

## 2017-05-27 HISTORY — DX: Dyspnea, unspecified: R06.00

## 2017-05-27 LAB — GLUCOSE, CAPILLARY: GLUCOSE-CAPILLARY: 173 mg/dL — AB (ref 65–99)

## 2017-05-27 SURGERY — COLONOSCOPY WITH PROPOFOL
Anesthesia: General

## 2017-05-27 MED ORDER — SODIUM CHLORIDE 0.9 % IV SOLN
INTRAVENOUS | Status: DC
  Administered 2017-05-27: 09:00:00 via INTRAVENOUS

## 2017-05-27 MED ORDER — PROPOFOL 10 MG/ML IV BOLUS
INTRAVENOUS | Status: DC | PRN
  Administered 2017-05-27: 70 mg via INTRAVENOUS

## 2017-05-27 MED ORDER — PROPOFOL 500 MG/50ML IV EMUL
INTRAVENOUS | Status: AC
  Filled 2017-05-27: qty 50

## 2017-05-27 MED ORDER — LIDOCAINE HCL (CARDIAC) 20 MG/ML IV SOLN
INTRAVENOUS | Status: DC | PRN
  Administered 2017-05-27: 40 mg via INTRAVENOUS

## 2017-05-27 MED ORDER — PROPOFOL 500 MG/50ML IV EMUL
INTRAVENOUS | Status: DC | PRN
  Administered 2017-05-27: 140 ug/kg/min via INTRAVENOUS

## 2017-05-27 MED ORDER — SODIUM CHLORIDE 0.9 % IV SOLN: INTRAVENOUS | Status: DC

## 2017-05-27 NOTE — Anesthesia Postprocedure Evaluation (Signed)
Anesthesia Post Note  Patient: Michael Wall  Procedure(s) Performed: COLONOSCOPY WITH PROPOFOL (N/A )  Patient location during evaluation: Endoscopy Anesthesia Type: General Level of consciousness: awake and alert and oriented Pain management: pain level controlled Vital Signs Assessment: post-procedure vital signs reviewed and stable Respiratory status: spontaneous breathing, nonlabored ventilation and respiratory function stable Cardiovascular status: blood pressure returned to baseline and stable Postop Assessment: no signs of nausea or vomiting Anesthetic complications: no     Last Vitals:  Vitals:   05/27/17 0934 05/27/17 0944  BP: 120/66 126/70  Pulse: 71 69  Resp: 18 17  Temp:    SpO2: 100% 98%    Last Pain:  Vitals:   05/27/17 0916  TempSrc: Tympanic                 [name]

## 2017-05-27 NOTE — Anesthesia Post-op Follow-up Note (Signed)
Anesthesia QCDR form completed.        

## 2017-05-27 NOTE — Anesthesia Preprocedure Evaluation (Signed)
Anesthesia Evaluation  Patient identified by MRN, date of birth, ID band Patient awake    Reviewed: Allergy & Precautions, NPO status , Patient's Chart, lab work & pertinent test results  History of Anesthesia Complications Negative for: history of anesthetic complications  Airway Mallampati: I  TM Distance: >3 FB Neck ROM: Full    Dental  (+) Edentulous Upper, Edentulous Lower   Pulmonary neg sleep apnea, neg COPD, former smoker,    breath sounds clear to auscultation- rhonchi (-) wheezing      Cardiovascular hypertension, Pt. on medications (-) angina+ CAD, + Past MI and + Cardiac Stents (had stent when he was in his 40s)   Rhythm:Regular Rate:Normal - Systolic murmurs and - Diastolic murmurs    Neuro/Psych PSYCHIATRIC DISORDERS Schizophrenia negative neurological ROS     GI/Hepatic Neg liver ROS, GERD  ,  Endo/Other  diabetes, Oral Hypoglycemic Agents  Renal/GU negative Renal ROS     Musculoskeletal  (+) Arthritis ,   Abdominal (+) - obese,   Peds  Hematology negative hematology ROS (+)   Anesthesia Other Findings Past Medical History: No date: Arthritis No date: Coronary artery disease No date: Diabetes (HCC) No date: Dyspnea No date: GERD (gastroesophageal reflux disease) No date: HBP (high blood pressure) No date: Myocardial infarction (HCC) No date: Schizophrenia (HCC)   Reproductive/Obstetrics                             Anesthesia Physical  Anesthesia Plan  ASA: III  Anesthesia Plan: General   Post-op Pain Management:    Induction: Intravenous  PONV Risk Score and Plan: 1 and Propofol infusion  Airway Management Planned: Natural Airway  Additional Equipment:   Intra-op Plan:   Post-operative Plan:   Informed Consent: I have reviewed the patients History and Physical, chart, labs and discussed the procedure including the risks, benefits and alternatives for  the proposed anesthesia with the patient or authorized representative who has indicated his/her understanding and acceptance.   Dental advisory given  Plan Discussed with: CRNA and Anesthesiologist  Anesthesia Plan Comments:         Anesthesia Quick Evaluation  

## 2017-05-27 NOTE — H&P (Signed)
Primary Care Physician:  Margaretann Loveless, MD Primary Gastroenterologist:  Dr. Mechele Collin  Pre-Procedure History & Physical: HPI:  Michael Wall is a 56 y.o. male is here for an colonoscopy.   Past Medical History:  Diagnosis Date  . Arthritis   . Coronary artery disease   . Diabetes (HCC)   . Dyspnea   . GERD (gastroesophageal reflux disease)   . HBP (high blood pressure)   . Myocardial infarction (HCC)   . Schizophrenia [facility])     Past Surgical History:  Procedure Laterality Date  . CARDIAC CATHETERIZATION    . CORONARY ANGIOPLASTY    . FINGER AMPUTATION Left     Prior to Admission medications   Medication Sig Start Date End Date Taking? Authorizing Provider  aspirin 81 MG EC tablet Take 81 mg by mouth daily. Swallow whole.   Yes [provider]  haloperidol (HALDOL) 0.5 MG tablet Take 0.5 mg by mouth 2 (two) times daily.   Yes [provider]  INVOKANA 300 MG TABS tablet  06/20/16  Yes [provider]  lisinopril (PRINIVIL,ZESTRIL) 20 MG tablet Take 20 mg by mouth daily.   Yes [provider]  metFORMIN (GLUCOPHAGE-XR) 500 MG 24 hr tablet  09/22/13  Yes [provider]  metoprolol (LOPRESSOR) 100 MG tablet  09/09/13  Yes [provider]  mirtazapine (REMERON) 30 MG tablet Take 30 mg by mouth at bedtime.   Yes [provider]  Multiple Vitamin (MULTIVITAMIN) tablet Take 1 tablet by mouth daily.   Yes [provider]  pantoprazole (PROTONIX) 40 MG tablet  06/22/16  Yes [provider]  QUEtiapine (SEROQUEL) 100 MG tablet  09/19/16  Yes [provider]  simvastatin (ZOCOR) 40 MG tablet  10/01/16  Yes [provider]  terbinafine (LAMISIL) 250 MG tablet  06/11/16  Yes [provider]  TRADJENTA 5 MG TABS tablet  06/04/16  Yes [provider]  acetaminophen (TYLENOL) 500 MG tablet Take 500 mg by mouth every 6 (six) hours as needed.    [provider]   benztropine (COGENTIN) 2 MG tablet Take 2 mg by mouth 2 (two) times daily.    [provider]  clopidogrel (PLAVIX) 75 MG tablet Take 75 mg by mouth daily with breakfast.    [provider]  diltiazem (CARDIZEM) 60 MG tablet Take 60 mg by mouth 4 (four) times daily.    [provider]  glipiZIDE (GLUCOTROL) 10 MG tablet Take 10 mg by mouth daily before breakfast.    [provider]  glucose blood test strip 1 each by Other route as needed for other. Use as instructed    [provider]  haloperidol (HALDOL) 5 MG tablet Take 5 mg by mouth 2 (two) times daily.    [provider]  JARDIANCE 25 MG TABS tablet  09/14/16   [provider]  metoprolol succinate (TOPROL-XL) 100 MG 24 hr tablet Take 100 mg by mouth daily. Take with or immediately following a meal.    [provider]  mirtazapine (REMERON) 7.5 MG tablet  06/28/16   [provider]  SUPREP BOWEL PREP SOLN  09/10/13   [provider]    Allergies as of 03/11/2017  . (No Known Allergies)    History reviewed. No pertinent family history.  Social History   Social History  . Marital status: Single    Spouse name: N/A  . Number of children: N/A  . Years of education: N/A  Occupational History  . Not on file.   Social History Main Topics  . Smoking status: Former Games developer  . Smokeless tobacco: Never Used     Comment: quit 1946  . Alcohol use No  . Drug use: No  . Sexual activity: Not on file   Other Topics Concern  . Not on file   Social History Narrative  . No narrative on file    Review of Systems: See HPI, otherwise negative ROS  Physical Exam: There were no vitals taken for this visit. General:   Alert,  pleasant and cooperative in NAD Head:  Normocephalic and atraumatic. Neck:  Supple; no masses or thyromegaly. Lungs:  Clear throughout to auscultation.    Heart:  Regular rate and rhythm. Abdomen:  Soft, nontender and  nondistended. Normal bowel sounds, without guarding, and without rebound.   Neurologic:  Alert and  oriented x4;  grossly normal neurologically.  Impression/Plan: Michael Wall is here for an colonoscopy to be performed for heme positive stool.  Risks, benefits, limitations, and alternatives regarding  colonoscopy have been reviewed with the patient.  Questions have been answered.  All parties agreeable.   Lynnae Prude, MD  05/27/2017, 8:55 AM

## 2017-05-27 NOTE — Transfer of Care (Signed)
Immediate Anesthesia Transfer of Care Note  Patient: Michael Wall  Procedure(s) Performed: COLONOSCOPY WITH PROPOFOL (N/A )  Patient Location: PACU  Anesthesia Type:General  Level of Consciousness: awake, alert  and oriented  Airway & Oxygen Therapy: Patient Spontanous Breathing and Patient connected to nasal cannula oxygen  Post-op Assessment: Report given to RN and Post -op Vital signs reviewed and stable  Post vital signs: Reviewed and stable  Last Vitals:  Vitals:   05/27/17 0914 05/27/17 0916  BP: (!) 95/43 (!) 95/43  Pulse: 70 70  Resp: 16 15  Temp: (!) 35.8 C (!) 35.8 C  SpO2: 100% 98%    Last Pain:  Vitals:   05/27/17 0916  TempSrc: Tympanic         Complications: No apparent anesthesia complications

## 2017-05-27 NOTE — Op Note (Signed)
[facility]astroenterology Patient Name: Michael Wall Procedure Date: 05/27/2017 8:57 AM MRN: [id number] Account #: [id number] Date of Birth: 18-Jul-1961 Admit Type: Outpatient Age: 56 Room: [facility] ENDO ROOM 3 Gender: Male Note Status: Finalized Procedure:            Colonoscopy Indications:          Heme positive stool Providers:            Scot Jun, MD Medicines:            Propofol per Anesthesia Complications:        No immediate complications. Procedure:            Pre-Anesthesia Assessment:                       - After reviewing the risks and benefits, the patient                        was deemed in satisfactory condition to undergo the                        procedure.                       After obtaining informed consent, the colonoscope was                        passed under direct vision. Throughout the procedure,                        the patient's blood pressure, pulse, and oxygen                        saturations were monitored continuously. The                        Colonoscope was introduced through the anus with the                        intention of advancing to the cecum. The scope was                        advanced to the rectum before the procedure was                        aborted. Medications were given. The quality of the                        bowel preparation was poor. Findings:      A moderate amount of stool was found in the rectum, precluding       visualization. Impression:           - Preparation of the colon was poor.                       - Stool in the rectum.                       - No specimens collected. Recommendation:       - The findings and recommendations were discussed with  the patient. Likely try again with prep while here or                        reschedule. Scot Jun, MD 05/27/2017 9:13:26 AM This report has been signed electronically. Number of Addenda:  0 Note Initiated On: 05/27/2017 8:57 AM Total Procedure Duration: 0 hours 1 minute 31 seconds       [facility]

## 2017-05-28 ENCOUNTER — Encounter: Payer: Self-pay | Admitting: Unknown Physician Specialty

## 2017-07-18 ENCOUNTER — Ambulatory Visit (INDEPENDENT_AMBULATORY_CARE_PROVIDER_SITE_OTHER): Payer: Medicare Other | Admitting: Podiatry

## 2017-07-18 ENCOUNTER — Encounter: Payer: Self-pay | Admitting: Podiatry

## 2017-07-18 DIAGNOSIS — M79609 Pain in unspecified limb: Secondary | ICD-10-CM

## 2017-07-18 DIAGNOSIS — E119 Type 2 diabetes mellitus without complications: Secondary | ICD-10-CM

## 2017-07-18 DIAGNOSIS — B351 Tinea unguium: Secondary | ICD-10-CM | POA: Diagnosis not present

## 2017-07-18 NOTE — Progress Notes (Signed)
Complaint:  Visit Type: Patient returns to my office for continued preventative foot care services. Complaint: Patient states" my nails have grown long and thick and become painful to walk and wear shoes" Patient has been diagnosed with DM with no foot complications. The patient presents for preventative foot care services. No changes to ROS  Podiatric Exam: Vascular: dorsalis pedis and posterior tibial pulses are palpable bilateral. Capillary return is immediate. Temperature gradient is WNL. Skin turgor WNL  Sensorium: Diminished  Semmes Weinstein monofilament test. Normal tactile sensation bilaterally. Nail Exam: Pt has thick disfigured discolored nails with subungual debris noted bilateral entire nail hallux through fifth toenails Ulcer Exam: There is no evidence of ulcer or pre-ulcerative changes or infection. Orthopedic Exam: Muscle tone and strength are WNL. No limitations in general ROM. No crepitus or effusions noted. Foot type and digits show no abnormalities. Bony prominences are unremarkable. Skin: No Porokeratosis. No infection or ulcers  Diagnosis:  Onychomycosis, , Pain in right toe, pain in left toes  Treatment & Plan Procedures and Treatment: Consent by patient was obtained for treatment procedures. The patient understood the discussion of treatment and procedures well. All questions were answered thoroughly reviewed. Debridement of mycotic and hypertrophic toenails, 1 through 5 bilateral and clearing of subungual debris. No ulceration, no infection noted.  Return Visit-Office Procedure: Patient instructed to return to the office for a follow up visit 3 months for continued evaluation and treatment.    Kainoa Swoboda DPM 

## 2017-10-18 ENCOUNTER — Encounter: Payer: Self-pay | Admitting: *Deleted

## 2017-10-21 ENCOUNTER — Encounter: Payer: Self-pay | Admitting: Certified Registered Nurse Anesthetist

## 2017-10-21 ENCOUNTER — Ambulatory Visit: Payer: Medicare Other | Admitting: Certified Registered Nurse Anesthetist

## 2017-10-21 ENCOUNTER — Ambulatory Visit
Admission: RE | Admit: 2017-10-21 | Discharge: 2017-10-21 | Disposition: A | Discharge status: Home / Self Care | Payer: Medicare Other | Source: Ambulatory Visit | Attending: Unknown Physician Specialty | Admitting: Unknown Physician Specialty

## 2017-10-21 ENCOUNTER — Encounter
Admission: RE | Disposition: A | Discharge status: Home / Self Care | Payer: Self-pay | Source: Ambulatory Visit | Attending: Unknown Physician Specialty

## 2017-10-21 ENCOUNTER — Other Ambulatory Visit: Payer: Self-pay

## 2017-10-21 DIAGNOSIS — F209 Schizophrenia, unspecified: Secondary | ICD-10-CM | POA: Diagnosis not present

## 2017-10-21 DIAGNOSIS — E119 Type 2 diabetes mellitus without complications: Secondary | ICD-10-CM | POA: Insufficient documentation

## 2017-10-21 DIAGNOSIS — K219 Gastro-esophageal reflux disease without esophagitis: Secondary | ICD-10-CM | POA: Insufficient documentation

## 2017-10-21 DIAGNOSIS — I2510 Atherosclerotic heart disease of native coronary artery without angina pectoris: Secondary | ICD-10-CM | POA: Insufficient documentation

## 2017-10-21 DIAGNOSIS — I252 Old myocardial infarction: Secondary | ICD-10-CM | POA: Diagnosis not present

## 2017-10-21 DIAGNOSIS — R195 Other fecal abnormalities: Secondary | ICD-10-CM | POA: Insufficient documentation

## 2017-10-21 DIAGNOSIS — K559 Vascular disorder of intestine, unspecified: Secondary | ICD-10-CM | POA: Insufficient documentation

## 2017-10-21 DIAGNOSIS — Z7982 Long term (current) use of aspirin: Secondary | ICD-10-CM | POA: Diagnosis not present

## 2017-10-21 DIAGNOSIS — K635 Polyp of colon: Secondary | ICD-10-CM | POA: Diagnosis not present

## 2017-10-21 DIAGNOSIS — M1990 Unspecified osteoarthritis, unspecified site: Secondary | ICD-10-CM | POA: Diagnosis not present

## 2017-10-21 DIAGNOSIS — Z7984 Long term (current) use of oral hypoglycemic drugs: Secondary | ICD-10-CM | POA: Insufficient documentation

## 2017-10-21 HISTORY — PX: COLONOSCOPY WITH PROPOFOL: SHX5780

## 2017-10-21 LAB — GLUCOSE, CAPILLARY: GLUCOSE-CAPILLARY: 186 mg/dL — AB (ref 65–99)

## 2017-10-21 SURGERY — COLONOSCOPY WITH PROPOFOL
Anesthesia: General

## 2017-10-21 MED ORDER — MIDAZOLAM HCL 2 MG/2ML IJ SOLN
INTRAMUSCULAR | Status: AC
  Filled 2017-10-21: qty 2

## 2017-10-21 MED ORDER — MIDAZOLAM HCL 2 MG/2ML IJ SOLN
INTRAMUSCULAR | Status: DC | PRN
  Administered 2017-10-21: 2 mg via INTRAVENOUS

## 2017-10-21 MED ORDER — LIDOCAINE HCL (CARDIAC) 20 MG/ML IV SOLN
INTRAVENOUS | Status: DC | PRN
  Administered 2017-10-21: 50 mg via INTRAVENOUS

## 2017-10-21 MED ORDER — FENTANYL CITRATE (PF) 100 MCG/2ML IJ SOLN
INTRAMUSCULAR | Status: AC
  Filled 2017-10-21: qty 2

## 2017-10-21 MED ORDER — PROPOFOL 10 MG/ML IV BOLUS
INTRAVENOUS | Status: DC | PRN
  Administered 2017-10-21: 50 mg via INTRAVENOUS

## 2017-10-21 MED ORDER — PHENYLEPHRINE HCL 10 MG/ML IJ SOLN
INTRAMUSCULAR | Status: DC | PRN
  Administered 2017-10-21 (×2): 100 ug via INTRAVENOUS
  Administered 2017-10-21 (×4): 200 ug via INTRAVENOUS

## 2017-10-21 MED ORDER — FENTANYL CITRATE (PF) 100 MCG/2ML IJ SOLN
INTRAMUSCULAR | Status: DC | PRN
  Administered 2017-10-21: 50 ug via INTRAVENOUS

## 2017-10-21 MED ORDER — SODIUM CHLORIDE 0.9 % IV SOLN: INTRAVENOUS | Status: DC

## 2017-10-21 MED ORDER — PROPOFOL 500 MG/50ML IV EMUL
INTRAVENOUS | Status: AC
  Filled 2017-10-21: qty 50

## 2017-10-21 MED ORDER — PROPOFOL 500 MG/50ML IV EMUL
INTRAVENOUS | Status: DC | PRN
  Administered 2017-10-21: 120 ug/kg/min via INTRAVENOUS

## 2017-10-21 MED ORDER — LIDOCAINE HCL (PF) 2 % IJ SOLN
INTRAMUSCULAR | Status: AC
  Filled 2017-10-21: qty 10

## 2017-10-21 MED ORDER — SODIUM CHLORIDE 0.9 % IV SOLN
INTRAVENOUS | Status: DC
  Administered 2017-10-21: 09:00:00 via INTRAVENOUS

## 2017-10-21 NOTE — H&P (Signed)
Primary Care Physician:  Margaretann Loveless, MD Primary Gastroenterologist:  Dr. Mechele Collin  Pre-Procedure History & Physical: HPI:  Michael Wall is a 57 y.o. male is here for an colonoscopy. This is for heme positive stool.   Past Medical History:  Diagnosis Date  . Arthritis   . Coronary artery disease   . Diabetes (HCC)   . Dyspnea   . GERD (gastroesophageal reflux disease)   . HBP (high blood pressure)   . Myocardial infarction (HCC)   . Schizophrenia [facility])     Past Surgical History:  Procedure Laterality Date  . CARDIAC CATHETERIZATION    . COLONOSCOPY WITH PROPOFOL N/A 05/27/2017   Procedure: COLONOSCOPY WITH PROPOFOL;  Surgeon: Scot Jun, MD;  Location: [facility] ENDOSCOPY;  Service: Endoscopy;  Laterality: N/A;  . CORONARY ANGIOPLASTY    . FINGER AMPUTATION Left     Prior to Admission medications   Medication Sig Start Date End Date Taking? Authorizing Provider  acetaminophen (TYLENOL) 500 MG tablet Take 500 mg by mouth every 6 (six) hours as needed.    [provider]  aspirin 81 MG EC tablet Take 81 mg by mouth daily. Swallow whole.    [provider]  benztropine (COGENTIN) 2 MG tablet Take 2 mg by mouth 2 (two) times daily.    [provider]  clopidogrel (PLAVIX) 75 MG tablet Take 75 mg by mouth daily with breakfast.    [provider]  diltiazem (CARDIZEM) 60 MG tablet Take 60 mg by mouth 4 (four) times daily.    [provider]  glipiZIDE (GLUCOTROL) 10 MG tablet Take 10 mg by mouth daily before breakfast.    [provider]  glucose blood test strip 1 each by Other route as needed for other. Use as instructed    [provider]  haloperidol (HALDOL) 0.5 MG tablet Take 0.5 mg by mouth 2 (two) times daily.    [provider]  haloperidol (HALDOL) 5 MG tablet Take 5 mg by mouth 2 (two) times daily.    [provider]  INVOKANA 300 MG TABS tablet  06/20/16   [provider]   JARDIANCE 25 MG TABS tablet  09/14/16   [provider]  lisinopril (PRINIVIL,ZESTRIL) 20 MG tablet Take 20 mg by mouth daily.    [provider]  metFORMIN (GLUCOPHAGE-XR) 500 MG 24 hr tablet  09/22/13   [provider]  metoprolol (LOPRESSOR) 100 MG tablet  09/09/13   [provider]  metoprolol succinate (TOPROL-XL) 100 MG 24 hr tablet Take 100 mg by mouth daily. Take with or immediately following a meal.    [provider]  mirtazapine (REMERON) 30 MG tablet Take 30 mg by mouth at bedtime.    [provider]  mirtazapine (REMERON) 7.5 MG tablet  06/28/16   [provider]  Multiple Vitamin (MULTIVITAMIN) tablet Take 1 tablet by mouth daily.    [provider]  pantoprazole (PROTONIX) 40 MG tablet  06/22/16   [provider]  QUEtiapine (SEROQUEL) 100 MG tablet  09/19/16   [provider]  simvastatin (ZOCOR) 40 MG tablet  10/01/16   [provider]  SUPREP BOWEL PREP SOLN  09/10/13   [provider]  terbinafine (LAMISIL) 250 MG tablet  06/11/16   [provider]  TRADJENTA 5 MG TABS tablet  06/04/16   [provider]    Allergies as of 09/16/2017  . (No Known Allergies)    Family  History  Problem Relation Age of Onset  . Heart disease Mother   . Stroke Father   . Heart disease Father     Social History   Socioeconomic History  . Marital status: Single    Spouse name: Not on file  . Number of children: Not on file  . Years of education: Not on file  . Highest education level: Not on file  Social Needs  . Financial resource strain: Not on file  . Food insecurity - worry: Not on file  . Food insecurity - inability: Not on file  . Transportation needs - medical: Not on file  . Transportation needs - non-medical: Not on file  Occupational History  . Not on file  Tobacco Use  . Smoking status: Former Games developer  . Smokeless tobacco: Never Used  . Tobacco  comment: quit 1946  Substance and Sexual Activity  . Alcohol use: No  . Drug use: No  . Sexual activity: Not on file  Other Topics Concern  . Not on file  Social History Narrative  . Not on file    Review of Systems: See HPI, otherwise negative ROS  Physical Exam: BP 126/83   Pulse 77   Temp 97.9 F (36.6 C) (Tympanic)   Resp 16   Ht 5\' 6"  (1.676 m)   Wt 88.5 kg (195 lb)   SpO2 99%   BMI 31.47 kg/m  General:   Alert,  pleasant and cooperative in NAD Head:  Normocephalic and atraumatic. Neck:  Supple; no masses or thyromegaly. Lungs:  Clear throughout to auscultation.    Heart:  Regular rate and rhythm. Abdomen:  Soft, nontender and nondistended. Normal bowel sounds, without guarding, and without rebound.   Neurologic:  Alert and  oriented x4;  grossly normal neurologically.  Impression/Plan: Michael Wall is here for an colonoscopy to be performed for heme positive stool.  Risks, benefits, limitations, and alternatives regarding  colonoscopy have been reviewed with the patient.  Questions have been answered.  All parties agreeable.   Lynnae Prude, MD  10/21/2017, 8:42 AM

## 2017-10-21 NOTE — Transfer of Care (Signed)
Immediate Anesthesia Transfer of Care Note  Patient: Michael Wall  Procedure(s) Performed: COLONOSCOPY WITH PROPOFOL (N/A )  Patient Location: PACU and Endoscopy Unit  Anesthesia Type:General  Level of Consciousness: drowsy  Airway & Oxygen Therapy: Patient Spontanous Breathing and Patient connected to nasal cannula oxygen  Post-op Assessment: Report given to RN and Post -op Vital signs reviewed and stable  Post vital signs: Reviewed and stable  Last Vitals:  Vitals:   10/21/17 0827  BP: 126/83  Pulse: 77  Resp: 16  Temp: 36.6 C  SpO2: 99%    Last Pain:  Vitals:   10/21/17 0827  TempSrc: Tympanic         Complications: No apparent anesthesia complications

## 2017-10-21 NOTE — Anesthesia Preprocedure Evaluation (Signed)
Anesthesia Evaluation  Patient identified by MRN, date of birth, ID band Patient awake    Reviewed: Allergy & Precautions, NPO status , Patient's Chart, lab work & pertinent test results  History of Anesthesia Complications Negative for: history of anesthetic complications  Airway Mallampati: I  TM Distance: >3 FB Neck ROM: Full    Dental  (+) Edentulous Upper, Edentulous Lower   Pulmonary neg sleep apnea, neg COPD, former smoker,    breath sounds clear to auscultation- rhonchi (-) wheezing      Cardiovascular hypertension, Pt. on medications (-) angina+ CAD, + Past MI and + Cardiac Stents (had stent when he was in his 49s)   Rhythm:Regular Rate:Normal - Systolic murmurs and - Diastolic murmurs    Neuro/Psych PSYCHIATRIC DISORDERS Schizophrenia negative neurological ROS     GI/Hepatic Neg liver ROS, GERD  ,  Endo/Other  diabetes, Oral Hypoglycemic Agents  Renal/GU negative Renal ROS     Musculoskeletal  (+) Arthritis ,   Abdominal (+) - obese,   Peds  Hematology negative hematology ROS (+)   Anesthesia Other Findings Past Medical History: No date: Arthritis No date: Coronary artery disease No date: Diabetes (HCC) No date: Dyspnea No date: GERD (gastroesophageal reflux disease) No date: HBP (high blood pressure) No date: Myocardial infarction (HCC) No date: Schizophrenia (HCC)   Reproductive/Obstetrics                             Anesthesia Physical  Anesthesia Plan  ASA: III  Anesthesia Plan: General   Post-op Pain Management:    Induction: Intravenous  PONV Risk Score and Plan: 1 and Propofol infusion  Airway Management Planned: Natural Airway  Additional Equipment:   Intra-op Plan:   Post-operative Plan:   Informed Consent: I have reviewed the patients History and Physical, chart, labs and discussed the procedure including the risks, benefits and alternatives for  the proposed anesthesia with the patient or authorized representative who has indicated his/her understanding and acceptance.   Dental advisory given  Plan Discussed with: CRNA and Anesthesiologist  Anesthesia Plan Comments:         Anesthesia Quick Evaluation

## 2017-10-21 NOTE — Anesthesia Postprocedure Evaluation (Signed)
Anesthesia Post Note  Patient: Michael Wall  Procedure(s) Performed: COLONOSCOPY WITH PROPOFOL (N/A )  Patient location during evaluation: PACU Anesthesia Type: General Level of consciousness: awake and alert and oriented Pain management: pain level controlled Vital Signs Assessment: post-procedure vital signs reviewed and stable Respiratory status: spontaneous breathing Cardiovascular status: blood pressure returned to baseline Anesthetic complications: no     Last Vitals:  Vitals:   10/21/17 0927 10/21/17 0937  BP: (!) 102/58 110/75  Pulse: 72 72  Resp: 20 18  Temp:    SpO2: 100% 100%    Last Pain:  Vitals:   10/21/17 0917  TempSrc: Tympanic                 Kriss Perleberg

## 2017-10-21 NOTE — Op Note (Addendum)
[facility] Gastroenterology Patient Name: Michael Wall Procedure Date: 10/21/2017 8:22 AM MRN: [id number] Account #: [id number] Date of Birth: 1960/08/19 Admit Type: Outpatient Age: 57 Room: [facility] ENDO ROOM 3 Gender: Male Note Status: Finalized Procedure:            Colonoscopy Indications:          Heme positive stool Providers:            Scot Jun, MD Referring MD:         Margaretann Loveless, MD (Referring MD) Medicines:            Propofol per Anesthesia Complications:        No immediate complications. Procedure:            Pre-Anesthesia Assessment:                       - After reviewing the risks and benefits, the patient                        was deemed in satisfactory condition to undergo the                        procedure.                       After obtaining informed consent, the colonoscope was                        passed under direct vision. Throughout the procedure,                        the patient's blood pressure, pulse, and oxygen                        saturations were monitored continuously. The                        Colonoscope was introduced through the anus with the                        intention of advancing to the cecum. The scope was                        advanced to the transverse colon before the procedure                        was aborted. Medications were given. The colonoscopy                        was somewhat difficult due to restricted mobility of                        the colon and a tortuous colon. The patient tolerated                        the procedure well. The quality of the bowel                        preparation was good. Findings:      A frond-like/villous, fungating and ulcerated partially obstructing  large mass was found in the mid transverse colon. The mass was partially       circumferential (involving two-thirds of the lumen circumference). No       active bleeding was present.  Biopsies were taken with a cold forceps for       histology. Sloughed cells seen in colon which were suctioned as scope       was advanced. Area was tattooed with an injection of 4 mL of [place] ink.       4 spots injected in a circular area away from the suspected tumor.      A diminutive polyp was found in the sigmoid colon. The polyp was       sessile. The polyp was removed with a jumbo cold forceps. Resection and       retrieval were complete. 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 Biopsied. Tattooed.                       - One diminutive polyp in the sigmoid colon, removed                        with a jumbo cold forceps. Resected and retrieved. Recommendation:       - Await pathology results. Scot Jun, MD 10/21/2017 9:20:53 AM This report has been signed electronically. Number of Addenda: 0 Note Initiated On: 10/21/2017 8:22 AM Total Procedure Duration: 0 hours 21 minutes 29 seconds       [facility]

## 2017-10-21 NOTE — Progress Notes (Signed)
Pt states that he is unsure of which medication he takes and when the last time he took his medications. His care givers provide these to him.

## 2017-10-21 NOTE — OR Nursing (Signed)
Pt resting quietly on left side, HOB lowered due to BP of 89/49.

## 2017-10-21 NOTE — Anesthesia Post-op Follow-up Note (Signed)
Anesthesia QCDR form completed.        

## 2017-10-22 ENCOUNTER — Encounter: Payer: Self-pay | Admitting: Unknown Physician Specialty

## 2017-10-23 LAB — SURGICAL PATHOLOGY

## 2017-11-11 ENCOUNTER — Ambulatory Visit (INDEPENDENT_AMBULATORY_CARE_PROVIDER_SITE_OTHER): Payer: Medicare Other | Admitting: Podiatry

## 2017-11-11 ENCOUNTER — Encounter: Payer: Self-pay | Admitting: Podiatry

## 2017-11-11 DIAGNOSIS — E119 Type 2 diabetes mellitus without complications: Secondary | ICD-10-CM

## 2017-11-11 DIAGNOSIS — M79609 Pain in unspecified limb: Secondary | ICD-10-CM

## 2017-11-11 DIAGNOSIS — B351 Tinea unguium: Secondary | ICD-10-CM

## 2017-11-11 NOTE — Progress Notes (Signed)
Complaint:  Visit Type: Patient returns to my office for continued preventative foot care services. Complaint: Patient states" my nails have grown long and thick and become painful to walk and wear shoes" Patient has been diagnosed with DM with no foot complications. The patient presents for preventative foot care services. No changes to ROS  Podiatric Exam: Vascular: dorsalis pedis and posterior tibial pulses are palpable bilateral. Capillary return is immediate. Temperature gradient is WNL. Skin turgor WNL  Sensorium: Diminished  Semmes Weinstein monofilament test. Normal tactile sensation bilaterally. Nail Exam: Pt has thick disfigured discolored nails with subungual debris noted bilateral entire nail hallux through fifth toenails Ulcer Exam: There is no evidence of ulcer or pre-ulcerative changes or infection. Orthopedic Exam: Muscle tone and strength are WNL. No limitations in general ROM. No crepitus or effusions noted. Foot type and digits show no abnormalities. Bony prominences are unremarkable. Skin: No Porokeratosis. No infection or ulcers  Diagnosis:  Onychomycosis, , Pain in right toe, pain in left toes  Treatment & Plan Procedures and Treatment: Consent by patient was obtained for treatment procedures. The patient understood the discussion of treatment and procedures well. All questions were answered thoroughly reviewed. Debridement of mycotic and hypertrophic toenails, 1 through 5 bilateral and clearing of subungual debris. No ulceration, no infection noted.  Return Visit-Office Procedure: Patient instructed to return to the office for a follow up visit 3 months for continued evaluation and treatment.    Shawnique Mariotti DPM 

## 2017-11-15 ENCOUNTER — Encounter (INDEPENDENT_AMBULATORY_CARE_PROVIDER_SITE_OTHER): Payer: Medicare Other | Admitting: Vascular Surgery

## 2017-12-03 ENCOUNTER — Ambulatory Visit (INDEPENDENT_AMBULATORY_CARE_PROVIDER_SITE_OTHER): Payer: Medicare Other | Admitting: Vascular Surgery

## 2017-12-03 ENCOUNTER — Encounter (INDEPENDENT_AMBULATORY_CARE_PROVIDER_SITE_OTHER): Payer: Self-pay | Admitting: Vascular Surgery

## 2017-12-03 VITALS — BP 137/81 | HR 83 | Resp 16 | Ht 73.0 in | Wt 174.0 lb

## 2017-12-03 DIAGNOSIS — E119 Type 2 diabetes mellitus without complications: Secondary | ICD-10-CM | POA: Diagnosis not present

## 2017-12-03 DIAGNOSIS — K559 Vascular disorder of intestine, unspecified: Secondary | ICD-10-CM | POA: Diagnosis not present

## 2017-12-03 DIAGNOSIS — I2510 Atherosclerotic heart disease of native coronary artery without angina pectoris: Secondary | ICD-10-CM

## 2017-12-03 DIAGNOSIS — I10 Essential (primary) hypertension: Secondary | ICD-10-CM | POA: Insufficient documentation

## 2017-12-03 NOTE — Patient Instructions (Signed)
Chronic Mesenteric Ischemia Mesenteric ischemia is poor blood flow (circulation) in the vessels that supply blood to the stomach, intestines, and liver (mesenteric organs). Chronic mesenteric ischemia, also called mesenteric angina or intestinal angina, is a long-term (chronic) condition. It happens when an artery or vein that provides blood to the mesenteric organs gradually becomes blocked or narrow, restricting the blood supply to the organs. When the blood supply is severely restricted, the mesenteric organs cannot work properly. What are the causes? This condition is commonly caused by fatty deposits that build up in an artery (plaque), which can narrow the artery and restrict blood flow. Other causes include:  Weakened areas in blood vessel walls (aneurysms).  Conditions that cause twisting or inflammation of blood vessels, such as fibromuscular dysplasia or arteritis.  A disorder in which blood clots form in the veins (venous thrombosis).  Scarring and thickening (fibrosis) of blood vessels caused by radiation therapy.  A tear in the aorta, the body's main artery (aortic dissection).  Blood vessel problems after illegal drug use, such as use of cocaine.  Tumors in the nervous system (neurofibromatosis).  Certain autoimmune diseases, such as lupus.  What increases the risk? The following factors may make you more likely to develop this condition:  Being male.  Being over age 50, especially if you have a history of heart problems.  Smoking.  Congestive heart failure.  Irregular heartbeat (arrhythmia).  Having a history of heart attack or stroke.  Diabetes.  High cholesterol.  High blood pressure (hypertension).  Being overweight or obese.  Kidney disease (renal disease) requiring dialysis.  What are the signs or symptoms? Symptoms of this condition include:  Abdomen (abdominal) pain or cramps that develop 15-60 minutes after a meal. This pain may last for 1-3  hours. Some people may develop a fear of eating because of this symptom.  Weight loss.  Diarrhea.  Bloody stool.  Nausea.  Vomiting.  Bloating.  Abdominal pain after stress or with exercise.  How is this diagnosed? This condition is diagnosed based on:  Your medical history.  A physical exam.  Tests, such as: ? Ultrasound. ? CT scan. ? Blood tests. ? Urine tests. ? An imaging test that involves injecting a dye into your arteries to show blood flow through blood vessels (angiogram). This can help to show if there are any blockages in the vessels that lead to the intestines. ? Passing a small probe through the mouth and into the stomach to measure the output of carbon dioxide (gastric tonometry). This can help to indicate whether there is decreased blood flow to the stomach and intestines.  How is this treated? This condition may be treated with:  Dietary changes such as eating smaller, low-fat, meals more frequently.  Lifestyle changes to treat underlying conditions that contribute to the disease, such as high cholesterol and high blood pressure.  Medicines to reduce blood clotting and increase blood flow.  Surgery to remove the blockage, repair arteries or veins, and restore blood flow. This may involve: ? Angioplasty. This is surgery to widen the affected artery, reduce the blockage, and sometimes insert a small, mesh tube (stent). ? Bypass surgery. This may be done to go around (bypass) the blockage and reconnect healthy arteries or veins. ? Placing a stent in the affected area. This may be done to help keep blocked arteries open.  Follow these instructions at home: Eating and drinking  Eat a heart-healthy diet. This includes fresh fruits and vegetables, whole grains, and lean proteins   like chicken, fish, eggs, and beans.  Avoid foods that contain a lot of: ? Salt (sodium). ? Sugar. ? Saturated fat (such as red meat). ? Trans fat (such as fried foods).  Stay  hydrated. Drink enough fluid to keep your urine clear or pale yellow. Lifestyle  Stay active and get regular exercise as told by your health care provider. Aim for 150 minutes of moderate activity or 75 minutes of vigorous activity a week. Ask your health care provider what activities and forms of exercise are safe for you.  Maintain a healthy weight.  Work with your health care provider to manage your cholesterol.  Manage any other health problems you have, such as high blood pressure, diabetes, or heart rhythm problems.  Do not use any products that contain nicotine or tobacco, such as cigarettes and e-cigarettes. If you need help quitting, ask your health care provider. General instructions  Take over-the-counter and prescription medicines only as told by your health care provider.  Keep all follow-up visits as told by your health care provider. This is important. Contact a health care provider if:  Your symptoms do not improve or they return after treatment.  You have a fever. Get help right away if:  You have severe abdominal pain.  You have severe chest pain.  You have shortness of breath.  You feel weak or dizzy.  You have palpitations.  You have numbness or weakness in your face, arm, or leg.  You are confused.  You have trouble speaking or people have trouble understanding what you are saying.  You are constipated.  You have trouble urinating.  You have blood in your stool.  You have severe nausea, vomiting, or persistent diarrhea. Summary  Mesenteric ischemia is poor circulation in the vessels that supply blood to the the stomach, intestines, and liver (mesenteric organs).  This condition happens when an artery or vein that provides blood to the mesenteric organs gradually becomes blocked or narrow, restricting the blood supply to the organs.  This condition is commonly caused by fatty deposits that build up in an artery (plaque), which can narrow the  artery and restrict blood flow.  You are more likely to develop this condition if you are over age 50 and have a history of heart problems, high blood pressure, diabetes, or high cholesterol.  This condition is usually treated with medicines, dietary and lifestyle changes, and surgery to remove the blockage, repair arteries or veins, and restore blood flow. This information is not intended to replace advice given to you by your health care provider. Make sure you discuss any questions you have with your health care provider. Document Released: 03/19/2011 Document Revised: 07/14/2016 Document Reviewed: 07/14/2016 Elsevier Interactive Patient Education  2017 [employer]  

## 2017-12-03 NOTE — Assessment & Plan Note (Signed)
blood glucose control important in reducing the progression of atherosclerotic disease. Also, involved in wound healing. On appropriate medications.  

## 2017-12-03 NOTE — Progress Notes (Signed)
Patient ID: Michael Wall, male   DOB: 01-29-1961, 57 y.o.   MRN: [id number]  Chief Complaint  Patient presents with  . New Patient (Initial Visit)    ref Mechele Collin for colonic ischemia    HPI Michael Wall is a 57 y.o. male.  I am asked to see the patient by Dr. Mechele Collin from gastroenterology for evaluation of ischemic colitis.  The patient reports a couple of months ago, having abdominal pain and diarrhea.  This has completely resolved and he denies any current diarrhea, abdominal pain, or constipation.  He denies any blood per rectum.  He had a colonoscopy several weeks ago now with biopsy-proven ischemic colitis in the transverse colon.  From the description of the colonoscopy, this had the appearance of malignancy but the biopsies were negative for malignancy.  He has a previous history of coronary stent placement given his history of atherosclerotic disease with ischemic colitis, he is referred for further evaluation and treatment   Past Medical History:  Diagnosis Date  . Arthritis   . Coronary artery disease   . Diabetes (HCC)   . Dyspnea   . GERD (gastroesophageal reflux disease)   . HBP (high blood pressure)   . Myocardial infarction (HCC)   . Schizophrenia [facility])     Past Surgical History:  Procedure Laterality Date  . CARDIAC CATHETERIZATION    . COLONOSCOPY WITH PROPOFOL N/A 05/27/2017   Procedure: COLONOSCOPY WITH PROPOFOL;  Surgeon: Scot Jun, MD;  Location: [facility] ENDOSCOPY;  Service: Endoscopy;  Laterality: N/A;  . COLONOSCOPY WITH PROPOFOL N/A 10/21/2017   Procedure: COLONOSCOPY WITH PROPOFOL;  Surgeon: Scot Jun, MD;  Location: [facility] ENDOSCOPY;  Service: Endoscopy;  Laterality: N/A;  . CORONARY ANGIOPLASTY    . FINGER AMPUTATION Left     Family History  Problem Relation Age of Onset  . Heart disease Mother   . Stroke Father   . Heart disease Father   No bleeding or clotting disorders  Social History Social History   Tobacco Use  .  Smoking status: Former Games developer  . Smokeless tobacco: Never Used  . Tobacco comment: quit 1946  Substance Use Topics  . Alcohol use: No  . Drug use: No    No Known Allergies  Current Outpatient Medications  Medication Sig Dispense Refill  . acetaminophen (TYLENOL) 500 MG tablet Take 500 mg by mouth every 6 (six) hours as needed.    Marland Kitchen aspirin 81 MG EC tablet Take 81 mg by mouth daily. Swallow whole.    . benztropine (COGENTIN) 2 MG tablet Take 2 mg by mouth 2 (two) times daily.    . clopidogrel (PLAVIX) 75 MG tablet Take 75 mg by mouth daily with breakfast.    . diltiazem (CARDIZEM) 60 MG tablet Take 60 mg by mouth 4 (four) times daily.    Marland Kitchen glipiZIDE (GLUCOTROL) 10 MG tablet Take 10 mg by mouth daily before breakfast.    . glucose blood test strip 1 each by Other route as needed for other. Use as instructed    . haloperidol (HALDOL) 0.5 MG tablet Take 0.5 mg by mouth 2 (two) times daily.    . haloperidol (HALDOL) 5 MG tablet Take 5 mg by mouth 2 (two) times daily.    . INVOKANA 300 MG TABS tablet     . JARDIANCE 25 MG TABS tablet     . lisinopril (PRINIVIL,ZESTRIL) 20 MG tablet Take 20 mg by mouth daily.    . metFORMIN (GLUCOPHAGE-XR)  500 MG 24 hr tablet     . metoprolol (LOPRESSOR) 100 MG tablet     . metoprolol succinate (TOPROL-XL) 100 MG 24 hr tablet Take 100 mg by mouth daily. Take with or immediately following a meal.    . mirtazapine (REMERON) 30 MG tablet Take 30 mg by mouth at bedtime.    . mirtazapine (REMERON) 7.5 MG tablet     . Multiple Vitamin (MULTIVITAMIN) tablet Take 1 tablet by mouth daily.    . pantoprazole (PROTONIX) 40 MG tablet     . QUEtiapine (SEROQUEL) 100 MG tablet     . simvastatin (ZOCOR) 40 MG tablet     . SUPREP BOWEL PREP SOLN     . terbinafine (LAMISIL) 250 MG tablet     . TRADJENTA 5 MG TABS tablet      No current facility-administered medications for this visit.       REVIEW OF SYSTEMS (Negative unless checked)  Constitutional: [] Weight loss   [] Fever  [] Chills Cardiac: [] Chest pain   [] Chest pressure   [x] Palpitations   [] Shortness of breath when laying flat   [] Shortness of breath at rest   [] Shortness of breath with exertion. Vascular:  [] Pain in legs with walking   [] Pain in legs at rest   [] Pain in legs when laying flat   [] Claudication   [] Pain in feet when walking  [] Pain in feet at rest  [] Pain in feet when laying flat   [] History of DVT   [] Phlebitis   [x] Swelling in legs   [] Varicose veins   [] Non-healing ulcers Pulmonary:   [] Uses home oxygen   [] Productive cough   [] Hemoptysis   [] Wheeze  [] COPD   [] Asthma Neurologic:  [] Dizziness  [] Blackouts   [] Seizures   [] History of stroke   [] History of TIA  [] Aphasia   [] Temporary blindness   [] Dysphagia   [] Weakness or numbness in arms   [] Weakness or numbness in legs Musculoskeletal:  [x] Arthritis   [] Joint swelling   [] Joint pain   [] Low back pain Hematologic:  [] Easy bruising  [] Easy bleeding   [] Hypercoagulable state   [] Anemic  [] Hepatitis Gastrointestinal:  [x] Blood in stool   [] Vomiting blood  [] Gastroesophageal reflux/heartburn   [] Abdominal pain Genitourinary:  [] Chronic kidney disease   [] Difficult urination  [] Frequent urination  [] Burning with urination   [] Hematuria Skin:  [] Rashes   [] Ulcers   [] Wounds Psychological:  [] History of anxiety   []  History of major depression.    Physical Exam BP 137/81 (BP Location: Right Arm)   Pulse 83   Resp 16   Ht 6\' 1"  (1.854 m)   Wt 78.9 kg (174 lb)   BMI 22.96 kg/m  Gen:  WD/WN, NAD.  Appears somewhat disheveled and older than stated age Head: [place]/AT, No temporalis wasting.  Ear/Nose/Throat: Hearing grossly intact, nares w/o erythema or drainage, oropharynx w/o Erythema/Exudate Eyes: Conjunctiva clear, sclera non-icteric  Neck: trachea midline.  No JVD.  Pulmonary:  Good air movement, clear to auscultation bilaterally.  Cardiac: RRR, normal S1, S2 Vascular:  Vessel Right Left  Radial Palpable Palpable                           PT Palpable  1+ palpable  DP Palpable Palpable   Gastrointestinal: soft, non-tender/non-distended. No guarding/reflex.  Musculoskeletal: M/S 5/5 throughout.  Extremities without ischemic changes.  No deformity or atrophy.  Trace bilateral lower extremity edema. Neurologic: Sensation grossly intact in extremities.  Symmetrical.  Speech  is fluent. Motor exam as listed above. Psychiatric: Judgment and insight appear very poor Dermatologic: No rashes or ulcers noted.  No cellulitis or open wounds.    Radiology No results found.  Labs Recent Results (from the past 2160 hour(s))  Glucose, capillary     Status: Abnormal   Collection Time: 10/21/17  8:37 AM  Result Value Ref Range   Glucose-Capillary 186 (H) 65 - 99 mg/dL  Surgical pathology     Status: None   Collection Time: 10/21/17  9:09 AM  Result Value Ref Range   SURGICAL PATHOLOGY      Surgical Pathology CASE: [id number] PATIENT: Delrae Rend Surgical Pathology Report     SPECIMEN SUBMITTED: A. Colon mass, transverse; cbx B. Colon polyp, sigmoid; cbx  CLINICAL HISTORY: None provided  PRE-OPERATIVE DIAGNOSIS: Screening  POST-OPERATIVE DIAGNOSIS: Probable transverse colon mass (tattooed), colon polyp     DIAGNOSIS: A. COLON MASS, TRANSVERSE; COLD BIOPSY: - COLONIC MUCOSA WITH ISCHEMIC COLITIS AND MUCOSAL CONGESTION. - MULTIPLE DEEPER SECTIONS EXAMINED. - NEGATIVE FOR DYSPLASIA AND MALIGNANCY.  B. COLON POLYP, SIGMOID; COLD BIOPSY: - HYPERPLASTIC POLYP. - MULTIPLE DEEPER SECTIONS EXAMINED. - NEGATIVE FOR DYSPLASIA AND MALIGNANCY.  GROSS DESCRIPTION:  A. Labeled: C BX transverse colon mass  Tissue fragment(s): multiple  Size: aggregate, 0.8 x 0.2 x 0.1 cm  Description: in formalin, friable pink fragments  Entirely submitted in 1 cassette(s).  B. Labeled: C BX sigmoid colon polyp  Tissue fragment(s): 1  Size: 0.5 c m  Description: in formalin, pink fragment  Entirely  submitted in 1 cassette(s).        Final Diagnosis performed by Elijah Birk, MD.  Electronically signed 10/23/2017 10:03:16AM    The electronic signature indicates that the named Attending Pathologist has evaluated the specimen  Technical component performed at [facility], [address] [place], [place] [address] Lab: [id number] Dir: Jolene Schimke, MD, MMM  Professional component performed at [facility], [facility], [address] [place], [place], [place] [address] Lab: [phone] Dir: Georgiann Cocker. Rubinas, MD      Assessment/Plan:  Coronary artery disease Status post percutaneous coronary intervention currently doing well.  HBP (high blood pressure) blood pressure control important in reducing the progression of atherosclerotic disease. On appropriate oral medications.   Diabetes (HCC) blood glucose control important in reducing the progression of atherosclerotic disease. Also, involved in wound healing. On appropriate medications.   Ischemic colitis (HCC) Currently asymptomatic but biopsy-proven.  We discussed the pathophysiology and natural history of ischemic colitis.  We discussed that the majority are small vessel events but that a large vessel because is also possible.  I would recommend a mesenteric duplex for further evaluation.  I have discussed the pathophysiology and natural history of ischemic colitis.  I will see him back following his mesenteric duplex to discuss the results and determine further treatment options.      Festus Barren 12/03/2017, 2:40 PM   This note was created with Dragon medical transcription system.  Any errors from dictation are unintentional.

## 2017-12-03 NOTE — Assessment & Plan Note (Signed)
Status post percutaneous coronary intervention currently doing well.

## 2017-12-03 NOTE — Assessment & Plan Note (Signed)
Currently asymptomatic but biopsy-proven.  We discussed the pathophysiology and natural history of ischemic colitis.  We discussed that the majority are small vessel events but that a large vessel because is also possible.  I would recommend a mesenteric duplex for further evaluation.  I have discussed the pathophysiology and natural history of ischemic colitis.  I will see him back following his mesenteric duplex to discuss the results and determine further treatment options.

## 2017-12-03 NOTE — Assessment & Plan Note (Signed)
blood pressure control important in reducing the progression of atherosclerotic disease. On appropriate oral medications.  

## 2017-12-25 ENCOUNTER — Encounter (INDEPENDENT_AMBULATORY_CARE_PROVIDER_SITE_OTHER): Payer: Self-pay | Admitting: Vascular Surgery

## 2017-12-25 ENCOUNTER — Ambulatory Visit (INDEPENDENT_AMBULATORY_CARE_PROVIDER_SITE_OTHER): Payer: Medicare Other | Admitting: Vascular Surgery

## 2017-12-25 ENCOUNTER — Ambulatory Visit (INDEPENDENT_AMBULATORY_CARE_PROVIDER_SITE_OTHER): Payer: Medicare Other

## 2017-12-25 VITALS — BP 116/75 | HR 95 | Resp 16 | Ht 73.0 in | Wt 166.8 lb

## 2017-12-25 DIAGNOSIS — E119 Type 2 diabetes mellitus without complications: Secondary | ICD-10-CM | POA: Diagnosis not present

## 2017-12-25 DIAGNOSIS — I10 Essential (primary) hypertension: Secondary | ICD-10-CM | POA: Diagnosis not present

## 2017-12-25 DIAGNOSIS — K559 Vascular disorder of intestine, unspecified: Secondary | ICD-10-CM | POA: Diagnosis not present

## 2017-12-25 DIAGNOSIS — I2510 Atherosclerotic heart disease of native coronary artery without angina pectoris: Secondary | ICD-10-CM

## 2017-12-25 NOTE — Progress Notes (Signed)
Subjective:    Patient ID: Michael Wall, male    DOB: 13-Dec-1960, 57 y.o.   MRN: [id number] Chief Complaint  Patient presents with  . Follow-up    pt conv mesentric   Patient presents her vascular studies.  The patient was last seen on December 03, 2017 and evaluation of a bout of abdominal pain and diarrhea with a history of coronary artery disease to rule out any mesenteric ischemia.  The patient has not had any recurrent symptoms since his last visit.  Denies any abdominal pain postprandial pain nausea, vomiting fever.  The patient underwent a abdominal mesenteric duplex which was notable for normal celiac artery, superior mesenteric artery, splenic artery and hepatic artery findings.  No hemodynamically significant stenosis of the mesenteric arteries are visualized.  Review of Systems  Constitutional: Negative.   HENT: Negative.   Eyes: Negative.   Respiratory: Negative.   Cardiovascular: Negative.   Gastrointestinal: Negative.   Endocrine: Negative.   Genitourinary: Negative.   Musculoskeletal: Negative.   Skin: Negative.   Allergic/Immunologic: Negative.   Neurological: Negative.   Hematological: Negative.   Psychiatric/Behavioral: Negative.       Objective:   Physical Exam  Constitutional: He is oriented to person, place, and time. He appears well-developed and well-nourished. No distress.  HENT:  Head: Normocephalic and atraumatic.  Right Ear: External ear normal.  Left Ear: External ear normal.  Eyes: Pupils are equal, Wall, and reactive to light. Conjunctivae and EOM are normal.  Neck: Normal range of motion.  Cardiovascular: Normal rate, regular rhythm, normal heart sounds and intact distal pulses.  Pulses:      Radial pulses are 2+ on the right side, and 2+ on the left side.       Dorsalis pedis pulses are 2+ on the right side, and 2+ on the left side.       Posterior tibial pulses are 2+ on the right side, and 2+ on the left side.  Pulmonary/Chest: Effort  normal and breath sounds normal.  Abdominal: Soft. Bowel sounds are normal. He exhibits no distension. There is no tenderness. There is no guarding.  Musculoskeletal: Normal range of motion. He exhibits no edema.  Neurological: He is alert and oriented to person, place, and time.  Skin: Skin is warm and dry. He is not diaphoretic.  Psychiatric: He has a normal mood and affect. His behavior is normal. Judgment and thought content normal.   BP 116/75 (BP Location: Right Arm)   Pulse 95   Resp 16   Ht 6\' 1"  (1.854 m)   Wt 166 lb 12.8 oz (75.7 kg)   BMI 22.01 kg/m   Past Medical History:  Diagnosis Date  . Arthritis   . Coronary artery disease   . Diabetes (HCC)   . Dyspnea   . GERD (gastroesophageal reflux disease)   . HBP (high blood pressure)   . Myocardial infarction (HCC)   . Schizophrenia [facility])    Social History   Socioeconomic History  . Marital status: Single    Spouse name: Not on file  . Number of children: Not on file  . Years of education: Not on file  . Highest education level: Not on file  Occupational History  . Not on file  Social Needs  . Financial resource strain: Not on file  . Food insecurity:    Worry: Not on file    Inability: Not on file  . Transportation needs:    Medical: Not on file  Non-medical: Not on file  Tobacco Use  . Smoking status: Former Games developer  . Smokeless tobacco: Never Used  . Tobacco comment: quit 1946  Substance and Sexual Activity  . Alcohol use: No  . Drug use: No  . Sexual activity: Not on file  Lifestyle  . Physical activity:    Days per week: Not on file    Minutes per session: Not on file  . Stress: Not on file  Relationships  . Social connections:    Talks on phone: Not on file    Gets together: Not on file    Attends religious service: Not on file    Active member of club or organization: Not on file    Attends meetings of clubs or organizations: Not on file    Relationship status: Not on file  . Intimate  partner violence:    Fear of current or ex partner: Not on file    Emotionally abused: Not on file    Physically abused: Not on file    Forced sexual activity: Not on file  Other Topics Concern  . Not on file  Social History Narrative  . Not on file   Past Surgical History:  Procedure Laterality Date  . CARDIAC CATHETERIZATION    . COLONOSCOPY WITH PROPOFOL N/A 05/27/2017   Procedure: COLONOSCOPY WITH PROPOFOL;  Surgeon: Scot Jun, MD;  Location: [facility] ENDOSCOPY;  Service: Endoscopy;  Laterality: N/A;  . COLONOSCOPY WITH PROPOFOL N/A 10/21/2017   Procedure: COLONOSCOPY WITH PROPOFOL;  Surgeon: Scot Jun, MD;  Location: [facility] ENDOSCOPY;  Service: Endoscopy;  Laterality: N/A;  . CORONARY ANGIOPLASTY    . FINGER AMPUTATION Left    Family History  Problem Relation Age of Onset  . Heart disease Mother   . Stroke Father   . Heart disease Father    No Known Allergies     Assessment & Plan:  Patient presents her vascular studies.  The patient was last seen on December 03, 2017 and evaluation of a bout of abdominal pain and diarrhea with a history of coronary artery disease to rule out any mesenteric ischemia.  The patient has not had any recurrent symptoms since his last visit.  Denies any abdominal pain postprandial pain nausea, vomiting fever.  The patient underwent a abdominal mesenteric duplex which was notable for normal celiac artery, superior mesenteric artery, splenic artery and hepatic artery findings.  No hemodynamically significant stenosis of the mesenteric arteries are visualized.  1. Ischemic colitis (HCC) - None No mesenteric artery stenosis found on duplex today Patient has not had a recurrence of his abdominal pain nausea vomiting diarrhea since it first occurred. The patient presents today asymptomatically Physical exam is unremarkable There is no indication for intervention at this time Patient to follow-up as needed  2. Type 2 diabetes mellitus without  complication, unspecified whether long term insulin use (HCC) - Stable Encouraged good control as its slows the progression of atherosclerotic disease  3. Essential hypertension - Stable Encouraged good control as its slows the progression of atherosclerotic disease  Current Outpatient Medications on File Prior to Visit  Medication Sig Dispense Refill  . acetaminophen (TYLENOL) 500 MG tablet Take 500 mg by mouth every 6 (six) hours as needed.    Marland Kitchen aspirin 81 MG EC tablet Take 81 mg by mouth daily. Swallow whole.    . benztropine (COGENTIN) 2 MG tablet Take 2 mg by mouth 2 (two) times daily.    . clopidogrel (PLAVIX) 75 MG tablet  Take 75 mg by mouth daily with breakfast.    . diltiazem (CARDIZEM) 60 MG tablet Take 60 mg by mouth 4 (four) times daily.    Marland Kitchen glipiZIDE (GLUCOTROL) 10 MG tablet Take 10 mg by mouth daily before breakfast.    . glucose blood test strip 1 each by Other route as needed for other. Use as instructed    . haloperidol (HALDOL) 0.5 MG tablet Take 0.5 mg by mouth 2 (two) times daily.    . haloperidol (HALDOL) 5 MG tablet Take 5 mg by mouth 2 (two) times daily.    . INVOKANA 300 MG TABS tablet     . JARDIANCE 25 MG TABS tablet     . lisinopril (PRINIVIL,ZESTRIL) 20 MG tablet Take 20 mg by mouth daily.    . metFORMIN (GLUCOPHAGE-XR) 500 MG 24 hr tablet     . metoprolol (LOPRESSOR) 100 MG tablet     . metoprolol succinate (TOPROL-XL) 100 MG 24 hr tablet Take 100 mg by mouth daily. Take with or immediately following a meal.    . mirtazapine (REMERON) 30 MG tablet Take 30 mg by mouth at bedtime.    . mirtazapine (REMERON) 7.5 MG tablet     . Multiple Vitamin (MULTIVITAMIN) tablet Take 1 tablet by mouth daily.    . pantoprazole (PROTONIX) 40 MG tablet     . QUEtiapine (SEROQUEL) 100 MG tablet     . simvastatin (ZOCOR) 40 MG tablet     . SUPREP BOWEL PREP SOLN     . terbinafine (LAMISIL) 250 MG tablet     . TRADJENTA 5 MG TABS tablet      No current facility-administered  medications on file prior to visit.    There are no Patient Instructions on file for this visit. No follow-ups on file.  Pearlena Ow A Olegario Emberson, PA-C

## 2018-02-10 ENCOUNTER — Ambulatory Visit (INDEPENDENT_AMBULATORY_CARE_PROVIDER_SITE_OTHER): Payer: Medicare Other | Admitting: Podiatry

## 2018-02-10 ENCOUNTER — Encounter: Payer: Self-pay | Admitting: Podiatry

## 2018-02-10 DIAGNOSIS — D689 Coagulation defect, unspecified: Secondary | ICD-10-CM

## 2018-02-10 DIAGNOSIS — B351 Tinea unguium: Secondary | ICD-10-CM

## 2018-02-10 DIAGNOSIS — E119 Type 2 diabetes mellitus without complications: Secondary | ICD-10-CM

## 2018-02-10 DIAGNOSIS — M79609 Pain in unspecified limb: Secondary | ICD-10-CM

## 2018-02-10 NOTE — Progress Notes (Signed)
Complaint:  Visit Type: Patient returns to my office for continued preventative foot care services. Complaint: Patient states" my nails have grown long and thick and become painful to walk and wear shoes" Patient has been diagnosed with DM with no foot complications. The patient presents for preventative foot care services. No changes to ROS.  Patient has been taking plavix.  Podiatric Exam: Vascular: dorsalis pedis and posterior tibial pulses are palpable bilateral. Capillary return is immediate. Temperature gradient is WNL. Skin turgor WNL  Sensorium: Diminished  Semmes Weinstein monofilament test. Normal tactile sensation bilaterally. Nail Exam: Pt has thick disfigured discolored nails with subungual debris noted bilateral entire nail hallux through fifth toenails Ulcer Exam: There is no evidence of ulcer or pre-ulcerative changes or infection. Orthopedic Exam: Muscle tone and strength are WNL. No limitations in general ROM. No crepitus or effusions noted. Foot type and digits show no abnormalities. Bony prominences are unremarkable. Skin: No Porokeratosis. No infection or ulcers  Diagnosis:  Onychomycosis, , Pain in right toe, pain in left toes  Treatment & Plan Procedures and Treatment: Consent by patient was obtained for treatment procedures. The patient understood the discussion of treatment and procedures well. All questions were answered thoroughly reviewed. Debridement of mycotic and hypertrophic toenails, 1 through 5 bilateral and clearing of subungual debris. No ulceration, no infection noted.  Return Visit-Office Procedure: Patient instructed to return to the office for a follow up visit 3 months for continued evaluation and treatment.    Meer Reindl DPM 

## 2018-05-15 ENCOUNTER — Encounter: Payer: Self-pay | Admitting: Podiatry

## 2018-05-15 ENCOUNTER — Ambulatory Visit (INDEPENDENT_AMBULATORY_CARE_PROVIDER_SITE_OTHER): Payer: Medicare Other | Admitting: Podiatry

## 2018-05-15 DIAGNOSIS — M79609 Pain in unspecified limb: Principal | ICD-10-CM

## 2018-05-15 DIAGNOSIS — E119 Type 2 diabetes mellitus without complications: Secondary | ICD-10-CM | POA: Diagnosis not present

## 2018-05-15 DIAGNOSIS — D689 Coagulation defect, unspecified: Secondary | ICD-10-CM

## 2018-05-15 DIAGNOSIS — M79676 Pain in unspecified toe(s): Secondary | ICD-10-CM | POA: Diagnosis not present

## 2018-05-15 DIAGNOSIS — B351 Tinea unguium: Secondary | ICD-10-CM | POA: Diagnosis not present

## 2018-05-15 NOTE — Progress Notes (Signed)
Complaint:  Visit Type: Patient returns to my office for continued preventative foot care services. Complaint: Patient states" my nails have grown long and thick and become painful to walk and wear shoes" Patient has been diagnosed with DM with no foot complications. The patient presents for preventative foot care services. No changes to ROS.  Patient has been taking plavix.  Podiatric Exam: Vascular: dorsalis pedis and posterior tibial pulses are palpable bilateral. Capillary return is immediate. Temperature gradient is WNL. Skin turgor WNL  Sensorium: Diminished  Semmes Weinstein monofilament test. Normal tactile sensation bilaterally. Nail Exam: Pt has thick disfigured discolored nails with subungual debris noted bilateral entire nail hallux through fifth toenails Ulcer Exam: There is no evidence of ulcer or pre-ulcerative changes or infection. Orthopedic Exam: Muscle tone and strength are WNL. No limitations in general ROM. No crepitus or effusions noted. Foot type and digits show no abnormalities. Bony prominences are unremarkable. Skin: No Porokeratosis. No infection or ulcers  Diagnosis:  Onychomycosis, , Pain in right toe, pain in left toes  Treatment & Plan Procedures and Treatment: Consent by patient was obtained for treatment procedures. The patient understood the discussion of treatment and procedures well. All questions were answered thoroughly reviewed. Debridement of mycotic and hypertrophic toenails, 1 through 5 bilateral and clearing of subungual debris. No ulceration, no infection noted.  Return Visit-Office Procedure: Patient instructed to return to the office for a follow up visit 3 months for continued evaluation and treatment.    Aldeen Riga DPM 

## 2018-07-13 ENCOUNTER — Emergency Department
Admission: EM | Admit: 2018-07-13 | Discharge: 2018-07-13 | Disposition: A | Discharge status: Home / Self Care | Payer: Medicare Other | Attending: Emergency Medicine | Admitting: Emergency Medicine

## 2018-07-13 ENCOUNTER — Emergency Department: Payer: Medicare Other

## 2018-07-13 ENCOUNTER — Other Ambulatory Visit: Payer: Self-pay

## 2018-07-13 ENCOUNTER — Encounter: Payer: Self-pay | Admitting: Emergency Medicine

## 2018-07-13 DIAGNOSIS — Z87891 Personal history of nicotine dependence: Secondary | ICD-10-CM | POA: Insufficient documentation

## 2018-07-13 DIAGNOSIS — Z7982 Long term (current) use of aspirin: Secondary | ICD-10-CM | POA: Insufficient documentation

## 2018-07-13 DIAGNOSIS — Z7902 Long term (current) use of antithrombotics/antiplatelets: Secondary | ICD-10-CM | POA: Diagnosis not present

## 2018-07-13 DIAGNOSIS — Z79899 Other long term (current) drug therapy: Secondary | ICD-10-CM | POA: Insufficient documentation

## 2018-07-13 DIAGNOSIS — I259 Chronic ischemic heart disease, unspecified: Secondary | ICD-10-CM | POA: Insufficient documentation

## 2018-07-13 DIAGNOSIS — R5383 Other fatigue: Secondary | ICD-10-CM

## 2018-07-13 DIAGNOSIS — R531 Weakness: Secondary | ICD-10-CM | POA: Diagnosis not present

## 2018-07-13 DIAGNOSIS — R627 Adult failure to thrive: Secondary | ICD-10-CM | POA: Diagnosis present

## 2018-07-13 DIAGNOSIS — I10 Essential (primary) hypertension: Secondary | ICD-10-CM | POA: Insufficient documentation

## 2018-07-13 DIAGNOSIS — Z7984 Long term (current) use of oral hypoglycemic drugs: Secondary | ICD-10-CM | POA: Insufficient documentation

## 2018-07-13 DIAGNOSIS — E119 Type 2 diabetes mellitus without complications: Secondary | ICD-10-CM | POA: Insufficient documentation

## 2018-07-13 LAB — URINALYSIS, COMPLETE (UACMP) WITH MICROSCOPIC
Bacteria, UA: NONE SEEN
Bilirubin Urine: NEGATIVE
HGB URINE DIPSTICK: NEGATIVE
Ketones, ur: NEGATIVE mg/dL
Leukocytes, UA: NEGATIVE
NITRITE: NEGATIVE
PROTEIN: NEGATIVE mg/dL
Specific Gravity, Urine: 1.018 (ref 1.005–1.030)
pH: 5 (ref 5.0–8.0)

## 2018-07-13 LAB — CBC WITH DIFFERENTIAL/PLATELET
ABS IMMATURE GRANULOCYTES: 0.02 10*3/uL (ref 0.00–0.07)
BASOS PCT: 0 %
Basophils Absolute: 0 10*3/uL (ref 0.0–0.1)
Eosinophils Absolute: 0.2 10*3/uL (ref 0.0–0.5)
Eosinophils Relative: 4 %
HCT: 39.1 % (ref 39.0–52.0)
Hemoglobin: 13.2 g/dL (ref 13.0–17.0)
IMMATURE GRANULOCYTES: 0 %
LYMPHS ABS: 1.6 10*3/uL (ref 0.7–4.0)
Lymphocytes Relative: 27 %
MCH: 29.7 pg (ref 26.0–34.0)
MCHC: 33.8 g/dL (ref 30.0–36.0)
MCV: 87.9 fL (ref 80.0–100.0)
MONOS PCT: 8 %
Monocytes Absolute: 0.5 10*3/uL (ref 0.1–1.0)
NEUTROS PCT: 61 %
Neutro Abs: 3.6 10*3/uL (ref 1.7–7.7)
PLATELETS: 194 10*3/uL (ref 150–400)
RBC: 4.45 MIL/uL (ref 4.22–5.81)
RDW: 12.7 % (ref 11.5–15.5)
WBC: 5.9 10*3/uL (ref 4.0–10.5)
nRBC: 0 % (ref 0.0–0.2)

## 2018-07-13 LAB — COMPREHENSIVE METABOLIC PANEL
ALK PHOS: 66 U/L (ref 38–126)
ALT: 25 U/L (ref 0–44)
AST: 19 U/L (ref 15–41)
Albumin: 3.9 g/dL (ref 3.5–5.0)
Anion gap: 10 (ref 5–15)
BILIRUBIN TOTAL: 0.6 mg/dL (ref 0.3–1.2)
BUN: 18 mg/dL (ref 6–20)
CO2: 22 mmol/L (ref 22–32)
CREATININE: 0.78 mg/dL (ref 0.61–1.24)
Calcium: 8.8 mg/dL — ABNORMAL LOW (ref 8.9–10.3)
Chloride: 109 mmol/L (ref 98–111)
Glucose, Bld: 139 mg/dL — ABNORMAL HIGH (ref 70–99)
Potassium: 4.2 mmol/L (ref 3.5–5.1)
Sodium: 141 mmol/L (ref 135–145)
TOTAL PROTEIN: 6.4 g/dL — AB (ref 6.5–8.1)

## 2018-07-13 MED ORDER — SODIUM CHLORIDE 0.9 % IV BOLUS
1000.0000 mL | Freq: Once | INTRAVENOUS | Status: AC
Start: 2018-07-13 — End: 2018-07-13
  Administered 2018-07-13: 1000 mL via INTRAVENOUS

## 2018-07-13 NOTE — ED Notes (Signed)
Pt's cargiver verbalized understanding of discharge instructions. NAD at this time.

## 2018-07-13 NOTE — Discharge Instructions (Addendum)
Mr. Michael Wall had normal labs, urine, and cxr in the ED. He seems to be doing well

## 2018-07-13 NOTE — ED Provider Notes (Signed)
[facility] Emergency Department Provider Note   ____________________________________________    I have reviewed the triage vital signs and the nursing notes.   HISTORY  Chief Complaint Failure To Thrive     HPI Michael Wall is a 57 y.o. male who was sent for evaluation from care home because of decreased activity, sleeping more than usual reportedly.  Patient here is quite lively and chatty describing how he has been an [employer] fan his whole life.  He states that he feels well and has no complaints.  No weakness or numbness or headache.  No fevers or chills reported.  No nausea or vomiting.  No chest pain or shortness of breath.  Past Medical History:  Diagnosis Date  . Arthritis   . Coronary artery disease   . Diabetes (HCC)   . Dyspnea   . GERD (gastroesophageal reflux disease)   . HBP (high blood pressure)   . Myocardial infarction (HCC)   . Schizophrenia [facility])     Patient Active Problem List   Diagnosis Date Noted  . Coronary artery disease 12/03/2017  . HBP (high blood pressure) 12/03/2017  . Diabetes (HCC) 12/03/2017  . Ischemic colitis (HCC) 12/03/2017    Past Surgical History:  Procedure Laterality Date  . CARDIAC CATHETERIZATION    . COLONOSCOPY WITH PROPOFOL N/A 05/27/2017   Procedure: COLONOSCOPY WITH PROPOFOL;  Surgeon: Scot Jun, MD;  Location: [facility] ENDOSCOPY;  Service: Endoscopy;  Laterality: N/A;  . COLONOSCOPY WITH PROPOFOL N/A 10/21/2017   Procedure: COLONOSCOPY WITH PROPOFOL;  Surgeon: Scot Jun, MD;  Location: [facility] ENDOSCOPY;  Service: Endoscopy;  Laterality: N/A;  . CORONARY ANGIOPLASTY    . FINGER AMPUTATION Left     Prior to Admission medications   Medication Sig Start Date End Date Taking? Authorizing Provider  aspirin 81 MG EC tablet Take 81 mg by mouth daily.    Yes [provider]  benztropine (COGENTIN) 2 MG tablet Take 2 mg by mouth 2 (two) times daily.   Yes [provider]  clopidogrel (PLAVIX) 75 MG tablet Take 75 mg by mouth daily with breakfast.   Yes [provider]  haloperidol (HALDOL) 10 MG tablet 10 mg 3 (three) times daily.  01/31/18  Yes [provider]  JARDIANCE 25 MG TABS tablet Take 25 mg by mouth daily.    Yes [provider]  lisinopril (PRINIVIL,ZESTRIL) 20 MG tablet Take 40 mg by mouth daily.    Yes [provider]  metoprolol (LOPRESSOR) 100 MG tablet Take 100 mg by mouth 2 (two) times daily.    Yes [provider]  mirtazapine (REMERON) 30 MG tablet Take 30 mg by mouth at bedtime.   Yes [provider]  Multiple Vitamin (MULTIVITAMIN) tablet Take 1 tablet by mouth daily.   Yes [provider]  pantoprazole (PROTONIX) 40 MG tablet Take 40 mg by mouth daily.  06/22/16  Yes [provider]  terbinafine (LAMISIL) 250 MG tablet Take 250 mg by mouth.  06/11/16  Yes [provider]  acetaminophen (TYLENOL) 500 MG tablet Take 500 mg by mouth every 6 (six) hours as needed.    [provider]  diltiazem (CARDIZEM CD) 120 MG 24 hr capsule  05/09/18   [provider]  haloperidol (HALDOL) 0.5 MG tablet Take 0.5 mg by mouth 2 (two) times daily.    [provider]  metFORMIN (GLUCOPHAGE-XR) 750 MG 24 hr tablet  01/28/18   [provider]  QUEtiapine (SEROQUEL) 100 MG tablet  09/19/16   [provider]  SUPREP BOWEL PREP SOLN  09/10/13   [provider]  TRULICITY 1.5 MG/0.5ML SOPN  02/03/18   [provider]     Allergies Patient has no known allergies.  Family History  Problem Relation Age of Onset  . Heart disease Mother   . Stroke Father   . Heart disease Father     Social History Social History   Tobacco Use  . Smoking status: Former Games developer  . Smokeless tobacco: Never Used  . Tobacco comment: quit 1946  Substance Use Topics  . Alcohol use: No  . Drug use: No    Review of  Systems  Constitutional: No fever/chills Eyes: No visual changes.  ENT: No sore throat. Cardiovascular: Denies chest pain. Respiratory: Denies shortness of breath. Gastrointestinal: No abdominal pain.   Genitourinary: Negative for dysuria. Musculoskeletal: Negative for back pain. Skin: Negative for rash. Neurological: Negative for headaches    ____________________________________________   PHYSICAL EXAM:  VITAL SIGNS: ED Triage Vitals  Enc Vitals Group     BP 07/13/18 0808 (!) 144/81     Pulse Rate 07/13/18 0808 92     Resp 07/13/18 0808 16     Temp 07/13/18 0808 (!) 97.5 F (36.4 C)     Temp Source 07/13/18 0808 Oral     SpO2 07/13/18 0808 99 %     Weight 07/13/18 0809 76.2 kg (168 lb)     Height 07/13/18 0809 1.829 m (6')     Head Circumference --      Peak Flow --      Pain Score 07/13/18 0809 0     Pain Loc --      Pain Edu? --      Excl. in GC? --     Constitutional: Alert and oriented. No acute distress. Pleasant and interactive  Head: Atraumatic. Nose: No congestion/rhinnorhea. Mouth/Throat: Mucous membranes are dry Neck:  Painless ROM Cardiovascular: Normal rate, regular rhythm. Grossly normal heart sounds.  Good peripheral circulation. Respiratory: Normal respiratory effort.  No retractions. Lungs CTAB. Gastrointestinal: Soft and nontender. No distention.    Musculoskeletal:   Warm and well perfused Neurologic:  Normal speech and language. No gross focal neurologic deficits are appreciated.  Normal strength in all extremities Skin:  Skin is warm, dry and intact. No rash noted. Psychiatric: Mood and affect are normal. Speech and behavior are normal.  ____________________________________________   LABS (all labs ordered are listed, but only abnormal results are displayed)  Labs Reviewed  URINALYSIS, COMPLETE (UACMP) WITH MICROSCOPIC - Abnormal; Notable for the following components:      Result Value   Color, Urine YELLOW (*)    APPearance CLEAR  (*)    Glucose, UA >=500 (*)    All other components within normal limits  COMPREHENSIVE METABOLIC PANEL - Abnormal; Notable for the following components:   Glucose, Bld 139 (*)    Calcium 8.8 (*)    Total Protein 6.4 (*)    All other components within normal limits  CBC WITH DIFFERENTIAL/PLATELET   ____________________________________________  EKG  None ____________________________________________  RADIOLOGY  Chest x-ray ____________________________________________   PROCEDURES  Procedure(s) performed: No  Procedures   Critical Care performed: No ____________________________________________   INITIAL IMPRESSION / ASSESSMENT AND PLAN / ED COURSE  Pertinent labs & imaging results that were available during my care of the patient were reviewed by me and considered in my medical decision making (  see chart for details).  Patient well-appearing in no acute distress.  Exam is quite reassuring although he does appear mildly dehydrated.  Lab work has been sent, vital signs reassuring.  Will obtain chest x-ray and reevaluate after test results have returned   Lab tests are reassuring, chest x-ray is normal.  Patient remains quite well-appearing and appears to be at his baseline, appropriate for discharge at this time.      ____________________________________________   FINAL CLINICAL IMPRESSION(S) / ED DIAGNOSES  Final diagnoses:  Fatigue, unspecified type        Note:  This document was prepared using Dragon voice recognition software and may include unintentional dictation errors.    Jene Every, MD 07/13/18 [phone]

## 2018-07-13 NOTE — ED Triage Notes (Signed)
Pt arrives via ems from [facility] assisted [facility]. Per ems report facility states they were concerned over pt's daily activity level decreasing. Facility reports pt "sleeps all day." PT denies this claim. Pt arrives alert and oriented to self, president, place, and situation. Unclear of baseline mentation. Pt asked why he was sent to the ED pt states, "they are scared something is wrong with me but I told them I was fine and nothing was wrong." Pt denies and pain or complaints at this time. EMS reports at blood sugar of 125 and compliance of medication regimen from facility.

## 2018-07-13 NOTE — ED Notes (Signed)
Pt states he "feels fine". States he is eating/drinking ok. Sleeps at night but occasionally wakes to "look at what time it is". States he "keeps telling them he's fine.

## 2018-07-13 NOTE — ED Notes (Signed)
Called group home and spoke with Jasmine December. Informed her that pt is up for discharge. She stated that someone would come pick pt up.

## 2018-08-14 ENCOUNTER — Encounter: Payer: Self-pay | Admitting: Podiatry

## 2018-08-14 ENCOUNTER — Ambulatory Visit (INDEPENDENT_AMBULATORY_CARE_PROVIDER_SITE_OTHER): Payer: Medicare Other | Admitting: Podiatry

## 2018-08-14 DIAGNOSIS — M79609 Pain in unspecified limb: Secondary | ICD-10-CM

## 2018-08-14 DIAGNOSIS — B351 Tinea unguium: Secondary | ICD-10-CM | POA: Diagnosis not present

## 2018-08-14 DIAGNOSIS — E119 Type 2 diabetes mellitus without complications: Secondary | ICD-10-CM

## 2018-08-14 DIAGNOSIS — D689 Coagulation defect, unspecified: Secondary | ICD-10-CM

## 2018-08-14 NOTE — Progress Notes (Signed)
Complaint:  Visit Type: Patient returns to my office for continued preventative foot care services. Complaint: Patient states" my nails have grown long and thick and become painful to walk and wear shoes" Patient has been diagnosed with DM with no foot complications. The patient presents for preventative foot care services. No changes to ROS.  Patient has been taking plavix.  Podiatric Exam: Vascular: dorsalis pedis and posterior tibial pulses are palpable bilateral. Capillary return is immediate. Temperature gradient is WNL. Skin turgor WNL  Sensorium: Diminished  Semmes Weinstein monofilament test. Normal tactile sensation bilaterally. Nail Exam: Pt has thick disfigured discolored nails with subungual debris noted bilateral entire nail hallux through fifth toenails Ulcer Exam: There is no evidence of ulcer or pre-ulcerative changes or infection. Orthopedic Exam: Muscle tone and strength are WNL. No limitations in general ROM. No crepitus or effusions noted. Foot type and digits show no abnormalities. Bony prominences are unremarkable. Skin: No Porokeratosis. No infection or ulcers  Diagnosis:  Onychomycosis, , Pain in right toe, pain in left toes  Treatment & Plan Procedures and Treatment: Consent by patient was obtained for treatment procedures. The patient understood the discussion of treatment and procedures well. All questions were answered thoroughly reviewed. Debridement of mycotic and hypertrophic toenails, 1 through 5 bilateral and clearing of subungual debris. No ulceration, no infection noted.  Return Visit-Office Procedure: Patient instructed to return to the office for a follow up visit 3 months for continued evaluation and treatment.    Tanylah Schnoebelen DPM 

## 2018-11-13 ENCOUNTER — Ambulatory Visit: Payer: Medicare Other | Admitting: Podiatry

## 2018-12-18 ENCOUNTER — Other Ambulatory Visit: Payer: Self-pay

## 2018-12-18 ENCOUNTER — Ambulatory Visit (INDEPENDENT_AMBULATORY_CARE_PROVIDER_SITE_OTHER): Payer: Medicare Other | Admitting: Podiatry

## 2018-12-18 ENCOUNTER — Encounter: Payer: Self-pay | Admitting: Podiatry

## 2018-12-18 VITALS — Temp 97.4°F

## 2018-12-18 DIAGNOSIS — M79676 Pain in unspecified toe(s): Secondary | ICD-10-CM | POA: Diagnosis not present

## 2018-12-18 DIAGNOSIS — D689 Coagulation defect, unspecified: Secondary | ICD-10-CM

## 2018-12-18 DIAGNOSIS — B351 Tinea unguium: Secondary | ICD-10-CM

## 2018-12-18 DIAGNOSIS — E119 Type 2 diabetes mellitus without complications: Secondary | ICD-10-CM

## 2018-12-18 DIAGNOSIS — M79609 Pain in unspecified limb: Principal | ICD-10-CM

## 2018-12-18 NOTE — Progress Notes (Signed)
Complaint:  Visit Type: Patient returns to my office for continued preventative foot care services. Complaint: Patient states" my nails have grown long and thick and become painful to walk and wear shoes" Patient has been diagnosed with DM with no foot complications. The patient presents for preventative foot care services. No changes to ROS.  Patient has been taking plavix.  Podiatric Exam: Vascular: dorsalis pedis and posterior tibial pulses are palpable bilateral. Capillary return is immediate. Temperature gradient is WNL. Skin turgor WNL  Sensorium: Diminished  Semmes Weinstein monofilament test. Normal tactile sensation bilaterally. Nail Exam: Pt has thick disfigured discolored nails with subungual debris noted bilateral entire nail hallux through fifth toenails Ulcer Exam: There is no evidence of ulcer or pre-ulcerative changes or infection. Orthopedic Exam: Muscle tone and strength are WNL. No limitations in general ROM. No crepitus or effusions noted. Foot type and digits show no abnormalities. Bony prominences are unremarkable. Skin: No Porokeratosis. No infection or ulcers  Diagnosis:  Onychomycosis, , Pain in right toe, pain in left toes  Treatment & Plan Procedures and Treatment: Consent by patient was obtained for treatment procedures. The patient understood the discussion of treatment and procedures well. All questions were answered thoroughly reviewed. Debridement of mycotic and hypertrophic toenails, 1 through 5 bilateral and clearing of subungual debris. No ulceration, no infection noted.  Return Visit-Office Procedure: Patient instructed to return to the office for a follow up visit 3 months for continued evaluation and treatment.    Trishelle Devora DPM 

## 2018-12-25 ENCOUNTER — Other Ambulatory Visit: Payer: Self-pay | Admitting: Cardiovascular Disease

## 2018-12-25 DIAGNOSIS — I2510 Atherosclerotic heart disease of native coronary artery without angina pectoris: Secondary | ICD-10-CM | POA: Insufficient documentation

## 2018-12-25 DIAGNOSIS — I200 Unstable angina: Secondary | ICD-10-CM | POA: Insufficient documentation

## 2018-12-25 MED ORDER — SODIUM CHLORIDE 0.9% FLUSH
3.0000 mL | Freq: Two times a day (BID) | INTRAVENOUS | Status: AC
  Filled 2018-12-25: qty 3

## 2018-12-30 ENCOUNTER — Other Ambulatory Visit
Admission: RE | Admit: 2018-12-30 | Discharge: 2018-12-30 | Disposition: A | Discharge status: Home / Self Care | Payer: Medicare Other | Source: Ambulatory Visit | Attending: Cardiovascular Disease | Admitting: Cardiovascular Disease

## 2018-12-30 ENCOUNTER — Other Ambulatory Visit: Payer: Self-pay

## 2018-12-30 DIAGNOSIS — Z1159 Encounter for screening for other viral diseases: Secondary | ICD-10-CM | POA: Insufficient documentation

## 2018-12-31 LAB — NOVEL CORONAVIRUS, NAA (HOSP ORDER, SEND-OUT TO REF LAB; TAT 18-24 HRS): SARS-CoV-2, NAA: NOT DETECTED

## 2019-01-02 ENCOUNTER — Encounter
Admission: RE | Disposition: A | Discharge status: Home / Self Care | Payer: Self-pay | Source: Home / Self Care | Attending: Cardiovascular Disease

## 2019-01-02 ENCOUNTER — Other Ambulatory Visit: Payer: Self-pay

## 2019-01-02 ENCOUNTER — Ambulatory Visit
Admission: RE | Admit: 2019-01-02 | Discharge: 2019-01-02 | Disposition: A | Discharge status: Home / Self Care | Payer: Medicare Other | Attending: Cardiovascular Disease | Admitting: Cardiovascular Disease

## 2019-01-02 DIAGNOSIS — I200 Unstable angina: Secondary | ICD-10-CM

## 2019-01-02 DIAGNOSIS — Z7902 Long term (current) use of antithrombotics/antiplatelets: Secondary | ICD-10-CM | POA: Diagnosis not present

## 2019-01-02 DIAGNOSIS — I10 Essential (primary) hypertension: Secondary | ICD-10-CM | POA: Insufficient documentation

## 2019-01-02 DIAGNOSIS — Z79899 Other long term (current) drug therapy: Secondary | ICD-10-CM | POA: Diagnosis not present

## 2019-01-02 DIAGNOSIS — E119 Type 2 diabetes mellitus without complications: Secondary | ICD-10-CM | POA: Diagnosis not present

## 2019-01-02 DIAGNOSIS — Z955 Presence of coronary angioplasty implant and graft: Secondary | ICD-10-CM | POA: Insufficient documentation

## 2019-01-02 DIAGNOSIS — K219 Gastro-esophageal reflux disease without esophagitis: Secondary | ICD-10-CM | POA: Insufficient documentation

## 2019-01-02 DIAGNOSIS — Z89022 Acquired absence of left finger(s): Secondary | ICD-10-CM | POA: Diagnosis not present

## 2019-01-02 DIAGNOSIS — I252 Old myocardial infarction: Secondary | ICD-10-CM | POA: Insufficient documentation

## 2019-01-02 DIAGNOSIS — I25110 Atherosclerotic heart disease of native coronary artery with unstable angina pectoris: Secondary | ICD-10-CM | POA: Diagnosis not present

## 2019-01-02 DIAGNOSIS — Z87891 Personal history of nicotine dependence: Secondary | ICD-10-CM | POA: Diagnosis not present

## 2019-01-02 DIAGNOSIS — E785 Hyperlipidemia, unspecified: Secondary | ICD-10-CM | POA: Diagnosis not present

## 2019-01-02 DIAGNOSIS — I2510 Atherosclerotic heart disease of native coronary artery without angina pectoris: Secondary | ICD-10-CM | POA: Insufficient documentation

## 2019-01-02 DIAGNOSIS — I340 Nonrheumatic mitral (valve) insufficiency: Secondary | ICD-10-CM | POA: Insufficient documentation

## 2019-01-02 DIAGNOSIS — R0602 Shortness of breath: Secondary | ICD-10-CM | POA: Insufficient documentation

## 2019-01-02 HISTORY — PX: LEFT HEART CATH AND CORONARY ANGIOGRAPHY: CATH118249

## 2019-01-02 LAB — GLUCOSE, CAPILLARY: Glucose-Capillary: 131 mg/dL — ABNORMAL HIGH (ref 70–99)

## 2019-01-02 SURGERY — LEFT HEART CATH AND CORONARY ANGIOGRAPHY
Anesthesia: Moderate Sedation | Laterality: Left

## 2019-01-02 MED ORDER — SODIUM CHLORIDE 0.9 % WEIGHT BASED INFUSION
3.0000 mL/kg/h | INTRAVENOUS | Status: AC
  Administered 2019-01-02: 07:00:00 3 mL/kg/h via INTRAVENOUS

## 2019-01-02 MED ORDER — SODIUM CHLORIDE 0.9 % WEIGHT BASED INFUSION: 1.0000 mL/kg/h | INTRAVENOUS | Status: DC

## 2019-01-02 MED ORDER — IOHEXOL 300 MG/ML  SOLN
INTRAMUSCULAR | Status: DC | PRN
  Administered 2019-01-02: 09:00:00 75 mL via INTRA_ARTERIAL

## 2019-01-02 MED ORDER — HEPARIN SODIUM (PORCINE) 1000 UNIT/ML IJ SOLN
INTRAMUSCULAR | Status: DC | PRN
  Administered 2019-01-02: 3400 [IU] via INTRAVENOUS

## 2019-01-02 MED ORDER — HEPARIN (PORCINE) IN NACL 1000-0.9 UT/500ML-% IV SOLN
INTRAVENOUS | Status: DC | PRN
  Administered 2019-01-02: 500 mL

## 2019-01-02 MED ORDER — HEPARIN SODIUM (PORCINE) 1000 UNIT/ML IJ SOLN
INTRAMUSCULAR | Status: AC
  Filled 2019-01-02: qty 1

## 2019-01-02 MED ORDER — FENTANYL CITRATE (PF) 100 MCG/2ML IJ SOLN
INTRAMUSCULAR | Status: AC
  Filled 2019-01-02: qty 2

## 2019-01-02 MED ORDER — SODIUM CHLORIDE 0.9 % IV SOLN: 250.0000 mL | INTRAVENOUS | Status: DC | PRN

## 2019-01-02 MED ORDER — VERAPAMIL HCL 2.5 MG/ML IV SOLN
INTRAVENOUS | Status: AC
  Filled 2019-01-02: qty 2

## 2019-01-02 MED ORDER — HYDRALAZINE HCL 20 MG/ML IJ SOLN: 10.0000 mg | INTRAMUSCULAR | Status: DC | PRN

## 2019-01-02 MED ORDER — VERAPAMIL HCL 2.5 MG/ML IV SOLN
INTRAVENOUS | Status: DC | PRN
  Administered 2019-01-02: 2.5 mg via INTRAVENOUS

## 2019-01-02 MED ORDER — SODIUM CHLORIDE 0.9% FLUSH: 3.0000 mL | INTRAVENOUS | Status: DC | PRN

## 2019-01-02 MED ORDER — LABETALOL HCL 5 MG/ML IV SOLN: 10.0000 mg | INTRAVENOUS | Status: DC | PRN

## 2019-01-02 MED ORDER — HEPARIN (PORCINE) IN NACL 1000-0.9 UT/500ML-% IV SOLN
INTRAVENOUS | Status: AC
  Filled 2019-01-02: qty 1000

## 2019-01-02 MED ORDER — MIDAZOLAM HCL 2 MG/2ML IJ SOLN
INTRAMUSCULAR | Status: DC | PRN
  Administered 2019-01-02: 1 mg via INTRAVENOUS

## 2019-01-02 MED ORDER — FENTANYL CITRATE (PF) 100 MCG/2ML IJ SOLN
INTRAMUSCULAR | Status: DC | PRN
  Administered 2019-01-02: 25 ug via INTRAVENOUS

## 2019-01-02 MED ORDER — SODIUM CHLORIDE 0.9% FLUSH: 3.0000 mL | Freq: Two times a day (BID) | INTRAVENOUS | Status: DC

## 2019-01-02 MED ORDER — ONDANSETRON HCL 4 MG/2ML IJ SOLN: 4.0000 mg | Freq: Four times a day (QID) | INTRAMUSCULAR | Status: DC | PRN

## 2019-01-02 MED ORDER — MIDAZOLAM HCL 2 MG/2ML IJ SOLN
INTRAMUSCULAR | Status: AC
  Filled 2019-01-02: qty 2

## 2019-01-02 MED ORDER — ASPIRIN 81 MG PO CHEW: 81.0000 mg | CHEWABLE_TABLET | ORAL | Status: DC

## 2019-01-02 MED ORDER — ACETAMINOPHEN 325 MG PO TABS: 650.0000 mg | ORAL_TABLET | ORAL | Status: DC | PRN

## 2019-01-02 SURGICAL SUPPLY — 8 items
CATH INFINITI 5 FR JL3.5 (CATHETERS) ×2 IMPLANT
CATH INFINITI 5FR JL4 (CATHETERS) ×2 IMPLANT
CATH INFINITI JR4 5F (CATHETERS) ×2 IMPLANT
DEVICE RAD TR BAND REGULAR (VASCULAR PRODUCTS) ×2 IMPLANT
GLIDESHEATH SLEND SS 6F .021 (SHEATH) ×2 IMPLANT
KIT MANI 3VAL PERCEP (MISCELLANEOUS) ×2 IMPLANT
PACK CARDIAC CATH (CUSTOM PROCEDURE TRAY) ×2 IMPLANT
WIRE ROSEN-J .035X260CM (WIRE) ×2 IMPLANT

## 2019-03-19 ENCOUNTER — Other Ambulatory Visit: Payer: Self-pay

## 2019-03-19 ENCOUNTER — Ambulatory Visit (INDEPENDENT_AMBULATORY_CARE_PROVIDER_SITE_OTHER): Payer: Medicare Other | Admitting: Podiatry

## 2019-03-19 ENCOUNTER — Encounter: Payer: Self-pay | Admitting: Podiatry

## 2019-03-19 VITALS — Temp 98.0°F

## 2019-03-19 DIAGNOSIS — B351 Tinea unguium: Secondary | ICD-10-CM | POA: Diagnosis not present

## 2019-03-19 DIAGNOSIS — E119 Type 2 diabetes mellitus without complications: Secondary | ICD-10-CM | POA: Diagnosis not present

## 2019-03-19 DIAGNOSIS — D689 Coagulation defect, unspecified: Secondary | ICD-10-CM

## 2019-03-19 DIAGNOSIS — M79676 Pain in unspecified toe(s): Secondary | ICD-10-CM

## 2019-03-19 NOTE — Progress Notes (Signed)
Complaint:  Visit Type: Patient returns to my office for continued preventative foot care services. Complaint: Patient states" my nails have grown long and thick and become painful to walk and wear shoes" Patient has been diagnosed with DM with no foot complications. The patient presents for preventative foot care services. No changes to ROS.  Patient has been taking plavix.  Podiatric Exam: Vascular: dorsalis pedis and posterior tibial pulses are palpable bilateral. Capillary return is immediate. Temperature gradient is WNL. Skin turgor WNL  Sensorium: Diminished  Semmes Weinstein monofilament test. Normal tactile sensation bilaterally. Nail Exam: Pt has thick disfigured discolored nails with subungual debris noted bilateral entire nail hallux through fifth toenails Ulcer Exam: There is no evidence of ulcer or pre-ulcerative changes or infection. Orthopedic Exam: Muscle tone and strength are WNL. No limitations in general ROM. No crepitus or effusions noted. Foot type and digits show no abnormalities. Bony prominences are unremarkable. Skin: No Porokeratosis. No infection or ulcers  Diagnosis:  Onychomycosis, , Pain in right toe, pain in left toes  Treatment & Plan Procedures and Treatment: Consent by patient was obtained for treatment procedures. The patient understood the discussion of treatment and procedures well. All questions were answered thoroughly reviewed. Debridement of mycotic and hypertrophic toenails, 1 through 5 bilateral and clearing of subungual debris. No ulceration, no infection noted.  Return Visit-Office Procedure: Patient instructed to return to the office for a follow up visit 3 months for continued evaluation and treatment.    Gardiner Barefoot DPM

## 2019-06-18 ENCOUNTER — Ambulatory Visit (INDEPENDENT_AMBULATORY_CARE_PROVIDER_SITE_OTHER): Payer: Medicare Other | Admitting: Podiatry

## 2019-06-18 ENCOUNTER — Other Ambulatory Visit: Payer: Self-pay

## 2019-06-18 ENCOUNTER — Encounter: Payer: Self-pay | Admitting: Podiatry

## 2019-06-18 DIAGNOSIS — E119 Type 2 diabetes mellitus without complications: Secondary | ICD-10-CM

## 2019-06-18 DIAGNOSIS — D689 Coagulation defect, unspecified: Secondary | ICD-10-CM

## 2019-06-18 DIAGNOSIS — M79676 Pain in unspecified toe(s): Secondary | ICD-10-CM | POA: Diagnosis not present

## 2019-06-18 DIAGNOSIS — B351 Tinea unguium: Secondary | ICD-10-CM | POA: Diagnosis not present

## 2019-06-18 NOTE — Progress Notes (Signed)
Complaint:  Visit Type: Patient returns to my office for continued preventative foot care services. Complaint: Patient states" my nails have grown long and thick and become painful to walk and wear shoes" Patient has been diagnosed with DM with no foot complications. The patient presents for preventative foot care services. No changes to ROS.  Patient has been taking plavix.  Podiatric Exam: Vascular: dorsalis pedis and posterior tibial pulses are palpable bilateral. Capillary return is immediate. Temperature gradient is WNL. Skin turgor WNL  Sensorium: Diminished  Semmes Weinstein monofilament test. Normal tactile sensation bilaterally. Nail Exam: Pt has thick disfigured discolored nails with subungual debris noted bilateral entire nail hallux through fifth toenails Ulcer Exam: There is no evidence of ulcer or pre-ulcerative changes or infection. Orthopedic Exam: Muscle tone and strength are WNL. No limitations in general ROM. No crepitus or effusions noted. Foot type and digits show no abnormalities. Bony prominences are unremarkable. Skin: No Porokeratosis. No infection or ulcers  Diagnosis:  Onychomycosis, , Pain in right toe, pain in left toes  Treatment & Plan Procedures and Treatment: Consent by patient was obtained for treatment procedures. The patient understood the discussion of treatment and procedures well. All questions were answered thoroughly reviewed. Debridement of mycotic and hypertrophic toenails, 1 through 5 bilateral and clearing of subungual debris. No ulceration, no infection noted.  Return Visit-Office Procedure: Patient instructed to return to the office for a follow up visit 3 months for continued evaluation and treatment.    Gardiner Barefoot DPM

## 2019-09-14 ENCOUNTER — Ambulatory Visit: Payer: Medicare Other | Admitting: Podiatry

## 2019-09-17 ENCOUNTER — Other Ambulatory Visit: Payer: Self-pay

## 2019-09-17 ENCOUNTER — Encounter: Payer: Self-pay | Admitting: Podiatry

## 2019-09-17 ENCOUNTER — Ambulatory Visit (INDEPENDENT_AMBULATORY_CARE_PROVIDER_SITE_OTHER): Payer: Medicare Other | Admitting: Podiatry

## 2019-09-17 DIAGNOSIS — B351 Tinea unguium: Secondary | ICD-10-CM | POA: Diagnosis not present

## 2019-09-17 DIAGNOSIS — M79609 Pain in unspecified limb: Secondary | ICD-10-CM

## 2019-09-17 DIAGNOSIS — E119 Type 2 diabetes mellitus without complications: Secondary | ICD-10-CM

## 2019-09-17 DIAGNOSIS — D689 Coagulation defect, unspecified: Secondary | ICD-10-CM

## 2019-09-17 NOTE — Progress Notes (Signed)
Complaint:  Visit Type: Patient returns to my office for continued preventative foot care services. Complaint: Patient states" my nails have grown long and thick and become painful to walk and wear shoes" Patient has been diagnosed with DM with no foot complications. The patient presents for preventative foot care services. No changes to ROS.  Patient has been taking plavix.  Podiatric Exam: Vascular: dorsalis pedis and posterior tibial pulses are palpable bilateral. Capillary return is immediate. Temperature gradient is WNL. Skin turgor WNL  Sensorium: Diminished  Semmes Weinstein monofilament test. Normal tactile sensation bilaterally. Nail Exam: Pt has thick disfigured discolored nails with subungual debris noted bilateral entire nail hallux through fifth toenails Ulcer Exam: There is no evidence of ulcer or pre-ulcerative changes or infection. Orthopedic Exam: Muscle tone and strength are WNL. No limitations in general ROM. No crepitus or effusions noted. Foot type and digits show no abnormalities. Bony prominences are unremarkable. Skin: No Porokeratosis. No infection or ulcers  Diagnosis:  Onychomycosis, , Pain in right toe, pain in left toes  Treatment & Plan Procedures and Treatment: Consent by patient was obtained for treatment procedures. The patient understood the discussion of treatment and procedures well. All questions were answered thoroughly reviewed. Debridement of mycotic and hypertrophic toenails, 1 through 5 bilateral and clearing of subungual debris. No ulceration, no infection noted.  Return Visit-Office Procedure: Patient instructed to return to the office for a follow up visit 4  months for continued evaluation and treatment.    Helane Gunther DPM

## 2020-01-18 ENCOUNTER — Encounter: Payer: Self-pay | Admitting: Podiatry

## 2020-01-18 ENCOUNTER — Ambulatory Visit (INDEPENDENT_AMBULATORY_CARE_PROVIDER_SITE_OTHER): Payer: Medicare Other | Admitting: Podiatry

## 2020-01-18 ENCOUNTER — Other Ambulatory Visit: Payer: Self-pay

## 2020-01-18 VITALS — Temp 97.2°F

## 2020-01-18 DIAGNOSIS — D689 Coagulation defect, unspecified: Secondary | ICD-10-CM | POA: Diagnosis not present

## 2020-01-18 DIAGNOSIS — E119 Type 2 diabetes mellitus without complications: Secondary | ICD-10-CM

## 2020-01-18 DIAGNOSIS — B351 Tinea unguium: Secondary | ICD-10-CM | POA: Diagnosis not present

## 2020-01-18 DIAGNOSIS — M79609 Pain in unspecified limb: Secondary | ICD-10-CM | POA: Diagnosis not present

## 2020-01-18 NOTE — Progress Notes (Signed)
This patient returns to my office for at risk foot care.  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  Patient is taking plavix.  This patient is unable to cut nails himself since the patient cannot reach his nails.These nails are painful walking and wearing shoes.  This patient presents for at risk foot care today.  General Appearance  Alert, conversant and in no acute stress.  Vascular  Dorsalis pedis and posterior tibial  pulses are palpable  bilaterally.  Capillary return is within normal limits  bilaterally. Temperature is within normal limits  bilaterally.  Neurologic  Senn-Weinstein monofilament wire test diminished   bilaterally. Muscle power within normal limits bilaterally.  Nails Thick disfigured discolored nails with subungual debris  from hallux to fifth toes bilaterally. No evidence of bacterial infection or drainage bilaterally.  Orthopedic  No limitations of motion  feet .  No crepitus or effusions noted.  No bony pathology or digital deformities noted.  Skin  normotropic skin with no porokeratosis noted bilaterally.  No signs of infections or ulcers noted.     Onychomycosis  Pain in right toes  Pain in left toes  Consent was obtained for treatment procedures.   Mechanical debridement of nails 1-5  bilaterally performed with a nail nipper.  Filed with dremel without incident.    Return office visit  4 months                    Told patient to return for periodic foot care and evaluation due to potential at risk complications.   Ulyses Panico DPM  

## 2020-05-19 ENCOUNTER — Ambulatory Visit (INDEPENDENT_AMBULATORY_CARE_PROVIDER_SITE_OTHER): Payer: Medicare Other | Admitting: Podiatry

## 2020-05-19 ENCOUNTER — Other Ambulatory Visit: Payer: Self-pay

## 2020-05-19 ENCOUNTER — Encounter: Payer: Self-pay | Admitting: Podiatry

## 2020-05-19 DIAGNOSIS — B351 Tinea unguium: Secondary | ICD-10-CM | POA: Diagnosis not present

## 2020-05-19 DIAGNOSIS — D689 Coagulation defect, unspecified: Secondary | ICD-10-CM | POA: Diagnosis not present

## 2020-05-19 DIAGNOSIS — E119 Type 2 diabetes mellitus without complications: Secondary | ICD-10-CM | POA: Diagnosis not present

## 2020-05-19 DIAGNOSIS — M79609 Pain in unspecified limb: Secondary | ICD-10-CM

## 2020-05-19 NOTE — Progress Notes (Signed)
This patient returns to my office for at risk foot care.  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  Patient is taking plavix.  This patient is unable to cut nails himself since the patient cannot reach his nails.These nails are painful walking and wearing shoes.  This patient presents for at risk foot care today.  General Appearance  Alert, conversant and in no acute stress.  Vascular  Dorsalis pedis and posterior tibial  pulses are palpable  bilaterally.  Capillary return is within normal limits  bilaterally. Temperature is within normal limits  bilaterally.  Neurologic  Senn-Weinstein monofilament wire test diminished   bilaterally. Muscle power within normal limits bilaterally.  Nails Thick disfigured discolored nails with subungual debris  from hallux to fifth toes bilaterally. No evidence of bacterial infection or drainage bilaterally.  Orthopedic  No limitations of motion  feet .  No crepitus or effusions noted.  No bony pathology or digital deformities noted.  Skin  normotropic skin with no porokeratosis noted bilaterally.  No signs of infections or ulcers noted.     Onychomycosis  Pain in right toes  Pain in left toes  Consent was obtained for treatment procedures.   Mechanical debridement of nails 1-5  bilaterally performed with a nail nipper.  Filed with dremel without incident.    Return office visit  3 months                    Told patient to return for periodic foot care and evaluation due to potential at risk complications.   Helane Gunther DPM

## 2020-08-18 ENCOUNTER — Emergency Department: Payer: Medicare Other

## 2020-08-18 ENCOUNTER — Other Ambulatory Visit: Payer: Self-pay

## 2020-08-18 ENCOUNTER — Emergency Department
Admission: EM | Admit: 2020-08-18 | Discharge: 2020-08-18 | Disposition: A | Discharge status: Home / Self Care | Payer: Self-pay

## 2020-08-18 ENCOUNTER — Observation Stay: Payer: Medicare Other

## 2020-08-18 ENCOUNTER — Inpatient Hospital Stay
Admission: EM | Admit: 2020-08-18 | Discharge: 2020-08-22 | DRG: 682 | Disposition: A | Discharge status: Home Health | Payer: Medicare Other | Attending: Internal Medicine | Admitting: Internal Medicine

## 2020-08-18 ENCOUNTER — Encounter: Payer: Self-pay | Admitting: Emergency Medicine

## 2020-08-18 DIAGNOSIS — N390 Urinary tract infection, site not specified: Secondary | ICD-10-CM | POA: Diagnosis present

## 2020-08-18 DIAGNOSIS — W19XXXA Unspecified fall, initial encounter: Secondary | ICD-10-CM

## 2020-08-18 DIAGNOSIS — I9589 Other hypotension: Secondary | ICD-10-CM | POA: Diagnosis not present

## 2020-08-18 DIAGNOSIS — E43 Unspecified severe protein-calorie malnutrition: Secondary | ICD-10-CM | POA: Insufficient documentation

## 2020-08-18 DIAGNOSIS — F79 Unspecified intellectual disabilities: Secondary | ICD-10-CM | POA: Diagnosis present

## 2020-08-18 DIAGNOSIS — E119 Type 2 diabetes mellitus without complications: Secondary | ICD-10-CM | POA: Diagnosis present

## 2020-08-18 DIAGNOSIS — Z8249 Family history of ischemic heart disease and other diseases of the circulatory system: Secondary | ICD-10-CM

## 2020-08-18 DIAGNOSIS — W208XXA Other cause of strike by thrown, projected or falling object, initial encounter: Secondary | ICD-10-CM | POA: Diagnosis present

## 2020-08-18 DIAGNOSIS — I252 Old myocardial infarction: Secondary | ICD-10-CM

## 2020-08-18 DIAGNOSIS — Z20822 Contact with and (suspected) exposure to covid-19: Secondary | ICD-10-CM | POA: Diagnosis present

## 2020-08-18 DIAGNOSIS — Z7902 Long term (current) use of antithrombotics/antiplatelets: Secondary | ICD-10-CM

## 2020-08-18 DIAGNOSIS — Z8639 Personal history of other endocrine, nutritional and metabolic disease: Secondary | ICD-10-CM

## 2020-08-18 DIAGNOSIS — E861 Hypovolemia: Secondary | ICD-10-CM | POA: Diagnosis present

## 2020-08-18 DIAGNOSIS — I959 Hypotension, unspecified: Secondary | ICD-10-CM

## 2020-08-18 DIAGNOSIS — B957 Other staphylococcus as the cause of diseases classified elsewhere: Secondary | ICD-10-CM | POA: Diagnosis present

## 2020-08-18 DIAGNOSIS — R748 Abnormal levels of other serum enzymes: Secondary | ICD-10-CM | POA: Diagnosis present

## 2020-08-18 DIAGNOSIS — F209 Schizophrenia, unspecified: Secondary | ICD-10-CM | POA: Diagnosis present

## 2020-08-18 DIAGNOSIS — Y92193 Bedroom in other specified residential institution as the place of occurrence of the external cause: Secondary | ICD-10-CM

## 2020-08-18 DIAGNOSIS — R52 Pain, unspecified: Secondary | ICD-10-CM

## 2020-08-18 DIAGNOSIS — I951 Orthostatic hypotension: Secondary | ICD-10-CM | POA: Diagnosis present

## 2020-08-18 DIAGNOSIS — M1990 Unspecified osteoarthritis, unspecified site: Secondary | ICD-10-CM | POA: Diagnosis present

## 2020-08-18 DIAGNOSIS — N179 Acute kidney failure, unspecified: Principal | ICD-10-CM | POA: Diagnosis present

## 2020-08-18 DIAGNOSIS — I2510 Atherosclerotic heart disease of native coronary artery without angina pectoris: Secondary | ICD-10-CM | POA: Diagnosis present

## 2020-08-18 DIAGNOSIS — Z8679 Personal history of other diseases of the circulatory system: Secondary | ICD-10-CM

## 2020-08-18 DIAGNOSIS — I10 Essential (primary) hypertension: Secondary | ICD-10-CM | POA: Diagnosis present

## 2020-08-18 DIAGNOSIS — S80211A Abrasion, right knee, initial encounter: Secondary | ICD-10-CM | POA: Diagnosis present

## 2020-08-18 DIAGNOSIS — Z681 Body mass index (BMI) 19 or less, adult: Secondary | ICD-10-CM

## 2020-08-18 DIAGNOSIS — Z7982 Long term (current) use of aspirin: Secondary | ICD-10-CM

## 2020-08-18 DIAGNOSIS — E876 Hypokalemia: Secondary | ICD-10-CM | POA: Diagnosis not present

## 2020-08-18 DIAGNOSIS — Z79899 Other long term (current) drug therapy: Secondary | ICD-10-CM

## 2020-08-18 DIAGNOSIS — Z823 Family history of stroke: Secondary | ICD-10-CM

## 2020-08-18 DIAGNOSIS — S8002XA Contusion of left knee, initial encounter: Secondary | ICD-10-CM | POA: Diagnosis present

## 2020-08-18 DIAGNOSIS — K219 Gastro-esophageal reflux disease without esophagitis: Secondary | ICD-10-CM | POA: Diagnosis present

## 2020-08-18 DIAGNOSIS — D509 Iron deficiency anemia, unspecified: Secondary | ICD-10-CM | POA: Diagnosis present

## 2020-08-18 LAB — CBC WITH DIFFERENTIAL/PLATELET
Abs Immature Granulocytes: 0.07 10*3/uL (ref 0.00–0.07)
Abs Immature Granulocytes: 0.07 10*3/uL (ref 0.00–0.07)
Basophils Absolute: 0.1 10*3/uL (ref 0.0–0.1)
Basophils Absolute: 0 10*3/uL (ref 0.0–0.1)
Basophils Relative: 1 %
Basophils Relative: 0 %
Eosinophils Absolute: 0 10*3/uL (ref 0.0–0.5)
Eosinophils Absolute: 0 10*3/uL (ref 0.0–0.5)
Eosinophils Relative: 0 %
Eosinophils Relative: 0 %
HCT: 32.1 % — ABNORMAL LOW (ref 39.0–52.0)
HCT: 30.6 % — ABNORMAL LOW (ref 39.0–52.0)
Hemoglobin: 10.7 g/dL — ABNORMAL LOW (ref 13.0–17.0)
Hemoglobin: 10.3 g/dL — ABNORMAL LOW (ref 13.0–17.0)
Immature Granulocytes: 1 %
Immature Granulocytes: 1 %
Lymphocytes Relative: 12 %
Lymphocytes Relative: 9 %
Lymphs Abs: 1.3 10*3/uL (ref 0.7–4.0)
Lymphs Abs: 1 10*3/uL (ref 0.7–4.0)
MCH: 28.4 pg (ref 26.0–34.0)
MCH: 28.2 pg (ref 26.0–34.0)
MCHC: 33.3 g/dL (ref 30.0–36.0)
MCHC: 33.7 g/dL (ref 30.0–36.0)
MCV: 85.1 fL (ref 80.0–100.0)
MCV: 83.8 fL (ref 80.0–100.0)
Monocytes Absolute: 1.2 10*3/uL — ABNORMAL HIGH (ref 0.1–1.0)
Monocytes Absolute: 1.2 10*3/uL — ABNORMAL HIGH (ref 0.1–1.0)
Monocytes Relative: 12 %
Monocytes Relative: 10 %
Neutro Abs: 8 10*3/uL — ABNORMAL HIGH (ref 1.7–7.7)
Neutro Abs: 9.4 10*3/uL — ABNORMAL HIGH (ref 1.7–7.7)
Neutrophils Relative %: 74 %
Neutrophils Relative %: 80 %
Platelets: 237 10*3/uL (ref 150–400)
Platelets: 209 10*3/uL (ref 150–400)
RBC: 3.77 MIL/uL — ABNORMAL LOW (ref 4.22–5.81)
RBC: 3.65 MIL/uL — ABNORMAL LOW (ref 4.22–5.81)
RDW: 13.1 % (ref 11.5–15.5)
RDW: 13 % (ref 11.5–15.5)
WBC: 10.6 10*3/uL — ABNORMAL HIGH (ref 4.0–10.5)
WBC: 11.7 10*3/uL — ABNORMAL HIGH (ref 4.0–10.5)
nRBC: 0.2 % (ref 0.0–0.2)
nRBC: 0 % (ref 0.0–0.2)

## 2020-08-18 LAB — URINALYSIS, ROUTINE W REFLEX MICROSCOPIC
Bilirubin Urine: NEGATIVE
Glucose, UA: >=500 mg/dL — AB
Ketones, ur: NEGATIVE mg/dL
Nitrite: NEGATIVE
Protein, ur: 100 mg/dL — AB
Specific Gravity, Urine: 1.017 (ref 1.005–1.030)
WBC, UA: >50 WBC/hpf — ABNORMAL HIGH (ref 0–5)
pH: 7 (ref 5.0–8.0)

## 2020-08-18 LAB — IRON AND TIBC
Iron: 37 ug/dL — ABNORMAL LOW (ref 45–182)
Saturation Ratios: 12 % — ABNORMAL LOW (ref 17.9–39.5)
TIBC: 305 ug/dL (ref 250–450)
UIBC: 268 ug/dL

## 2020-08-18 LAB — CBG MONITORING, ED
Glucose-Capillary: 116 mg/dL — ABNORMAL HIGH (ref 70–99)
Glucose-Capillary: 227 mg/dL — ABNORMAL HIGH (ref 70–99)

## 2020-08-18 LAB — BASIC METABOLIC PANEL
Anion gap: 14 (ref 5–15)
BUN: 57 mg/dL — ABNORMAL HIGH (ref 6–20)
CO2: 19 mmol/L — ABNORMAL LOW (ref 22–32)
Calcium: 8.6 mg/dL — ABNORMAL LOW (ref 8.9–10.3)
Chloride: 104 mmol/L (ref 98–111)
Creatinine, Ser: 2.25 mg/dL — ABNORMAL HIGH (ref 0.61–1.24)
GFR, Estimated: 34 mL/min — ABNORMAL LOW (ref >60)
Glucose, Bld: 157 mg/dL — ABNORMAL HIGH (ref 70–99)
Potassium: 4.1 mmol/L (ref 3.5–5.1)
Sodium: 137 mmol/L (ref 135–145)

## 2020-08-18 LAB — COMPREHENSIVE METABOLIC PANEL
ALT: 48 U/L — ABNORMAL HIGH (ref 0–44)
AST: 41 U/L (ref 15–41)
Albumin: 2.8 g/dL — ABNORMAL LOW (ref 3.5–5.0)
Alkaline Phosphatase: 61 U/L (ref 38–126)
Anion gap: 10 (ref 5–15)
BUN: 55 mg/dL — ABNORMAL HIGH (ref 6–20)
CO2: 20 mmol/L — ABNORMAL LOW (ref 22–32)
Calcium: 8.4 mg/dL — ABNORMAL LOW (ref 8.9–10.3)
Chloride: 107 mmol/L (ref 98–111)
Creatinine, Ser: 1.97 mg/dL — ABNORMAL HIGH (ref 0.61–1.24)
GFR, Estimated: 38 mL/min — ABNORMAL LOW (ref >60)
Glucose, Bld: 140 mg/dL — ABNORMAL HIGH (ref 70–99)
Potassium: 4.1 mmol/L (ref 3.5–5.1)
Sodium: 137 mmol/L (ref 135–145)
Total Bilirubin: 0.5 mg/dL (ref 0.3–1.2)
Total Protein: 5.6 g/dL — ABNORMAL LOW (ref 6.5–8.1)

## 2020-08-18 LAB — HEMOGLOBIN A1C
Hgb A1c MFr Bld: 6.4 % — ABNORMAL HIGH (ref 4.8–5.6)
Mean Plasma Glucose: 136.98 mg/dL

## 2020-08-18 LAB — CREATININE, SERUM
Creatinine, Ser: 1.54 mg/dL — ABNORMAL HIGH (ref 0.61–1.24)
GFR, Estimated: 51 mL/min — ABNORMAL LOW (ref >60)

## 2020-08-18 LAB — RESP PANEL BY RT-PCR (FLU A&B, COVID) ARPGX2
Influenza A by PCR: NEGATIVE
Influenza B by PCR: NEGATIVE
SARS Coronavirus 2 by RT PCR: NEGATIVE

## 2020-08-18 LAB — LACTIC ACID, PLASMA
Lactic Acid, Venous: 1.3 mmol/L (ref 0.5–1.9)
Lactic Acid, Venous: 1.2 mmol/L (ref 0.5–1.9)

## 2020-08-18 LAB — GLUCOSE, CAPILLARY: Glucose-Capillary: 149 mg/dL — ABNORMAL HIGH (ref 70–99)

## 2020-08-18 LAB — HIV ANTIBODY (ROUTINE TESTING W REFLEX): HIV Screen 4th Generation wRfx: NONREACTIVE

## 2020-08-18 LAB — FERRITIN: Ferritin: 90 ng/mL (ref 24–336)

## 2020-08-18 LAB — TSH: TSH: 0.989 u[IU]/mL (ref 0.350–4.500)

## 2020-08-18 LAB — LIPASE, BLOOD: Lipase: 20 U/L (ref 11–51)

## 2020-08-18 MED ORDER — BENZTROPINE MESYLATE 2 MG PO TABS
2.0000 mg | ORAL_TABLET | Freq: Two times a day (BID) | ORAL | Status: DC
  Administered 2020-08-18 – 2020-08-22 (×9): 2 mg via ORAL
  Filled 2020-08-18 (×6): qty 1
  Filled 2020-08-18: qty 2
  Filled 2020-08-18 (×4): qty 1

## 2020-08-18 MED ORDER — SODIUM CHLORIDE 0.9 % IV SOLN
1.0000 g | Freq: Once | INTRAVENOUS | Status: AC
  Administered 2020-08-18: 1 g via INTRAVENOUS
  Filled 2020-08-18: qty 10

## 2020-08-18 MED ORDER — LACTATED RINGERS IV SOLN: INTRAVENOUS | Status: DC

## 2020-08-18 MED ORDER — SODIUM CHLORIDE 0.9 % IV SOLN: INTRAVENOUS | Status: AC

## 2020-08-18 MED ORDER — SODIUM CHLORIDE 0.9 % IV BOLUS (SEPSIS)
1000.0000 mL | Freq: Once | INTRAVENOUS | Status: AC
  Administered 2020-08-18: 1000 mL via INTRAVENOUS

## 2020-08-18 MED ORDER — QUETIAPINE FUMARATE 25 MG PO TABS
150.0000 mg | ORAL_TABLET | Freq: Every day | ORAL | Status: DC
  Administered 2020-08-18 – 2020-08-21 (×4): 150 mg via ORAL
  Filled 2020-08-18 (×4): qty 1

## 2020-08-18 MED ORDER — INSULIN ASPART 100 UNIT/ML ~~LOC~~ SOLN
0.0000 [IU] | Freq: Three times a day (TID) | SUBCUTANEOUS | Status: DC
  Administered 2020-08-18: 3 [IU] via SUBCUTANEOUS
  Administered 2020-08-19: 2 [IU] via SUBCUTANEOUS
  Administered 2020-08-19: 1 [IU] via SUBCUTANEOUS
  Administered 2020-08-19 – 2020-08-20 (×2): 2 [IU] via SUBCUTANEOUS
  Administered 2020-08-21: 1 [IU] via SUBCUTANEOUS
  Administered 2020-08-21: 5 [IU] via SUBCUTANEOUS
  Administered 2020-08-22: 1 [IU] via SUBCUTANEOUS
  Administered 2020-08-22: 7 [IU] via SUBCUTANEOUS
  Filled 2020-08-18 (×8): qty 1

## 2020-08-18 MED ORDER — SODIUM CHLORIDE 0.9 % IV BOLUS (SEPSIS)
1000.0000 mL | Freq: Once | INTRAVENOUS | Status: DC
  Administered 2020-08-18: 1000 mL via INTRAVENOUS

## 2020-08-18 MED ORDER — ONDANSETRON HCL 4 MG PO TABS: 4.0000 mg | ORAL_TABLET | Freq: Four times a day (QID) | ORAL | Status: DC | PRN

## 2020-08-18 MED ORDER — HEPARIN SODIUM (PORCINE) 5000 UNIT/ML IJ SOLN
5000.0000 [IU] | Freq: Three times a day (TID) | INTRAMUSCULAR | Status: DC
  Administered 2020-08-18 – 2020-08-22 (×13): 5000 [IU] via SUBCUTANEOUS
  Filled 2020-08-18 (×13): qty 1

## 2020-08-18 MED ORDER — METOPROLOL TARTRATE 50 MG PO TABS
100.0000 mg | ORAL_TABLET | Freq: Two times a day (BID) | ORAL | Status: DC
  Administered 2020-08-18 – 2020-08-20 (×4): 100 mg via ORAL
  Filled 2020-08-18 (×7): qty 2

## 2020-08-18 MED ORDER — SODIUM CHLORIDE 0.9 % IV SOLN
1.0000 g | INTRAVENOUS | Status: DC
  Administered 2020-08-19 – 2020-08-20 (×2): 1 g via INTRAVENOUS
  Filled 2020-08-18 (×2): qty 1
  Filled 2020-08-18: qty 10

## 2020-08-18 MED ORDER — HALOPERIDOL 5 MG PO TABS
10.0000 mg | ORAL_TABLET | Freq: Three times a day (TID) | ORAL | Status: DC
  Administered 2020-08-18 – 2020-08-22 (×12): 10 mg via ORAL
  Filled 2020-08-18 (×16): qty 2

## 2020-08-18 MED ORDER — ROSUVASTATIN CALCIUM 10 MG PO TABS
40.0000 mg | ORAL_TABLET | Freq: Every evening | ORAL | Status: DC
Start: 2020-08-18 — End: 2020-08-20
  Administered 2020-08-18 – 2020-08-19 (×2): 40 mg via ORAL
  Filled 2020-08-18: qty 2
  Filled 2020-08-18: qty 4

## 2020-08-18 MED ORDER — ACETAMINOPHEN 325 MG PO TABS: 650.0000 mg | ORAL_TABLET | Freq: Four times a day (QID) | ORAL | Status: DC | PRN

## 2020-08-18 MED ORDER — CLOPIDOGREL BISULFATE 75 MG PO TABS
75.0000 mg | ORAL_TABLET | Freq: Every day | ORAL | Status: DC
  Administered 2020-08-18 – 2020-08-22 (×5): 75 mg via ORAL
  Filled 2020-08-18 (×5): qty 1

## 2020-08-18 MED ORDER — ACETAMINOPHEN 650 MG RE SUPP: 650.0000 mg | Freq: Four times a day (QID) | RECTAL | Status: DC | PRN

## 2020-08-18 MED ORDER — ONDANSETRON HCL 4 MG/2ML IJ SOLN: 4.0000 mg | Freq: Four times a day (QID) | INTRAMUSCULAR | Status: DC | PRN

## 2020-08-18 MED ORDER — ENOXAPARIN SODIUM 40 MG/0.4ML ~~LOC~~ SOLN: 40.0000 mg | SUBCUTANEOUS | Status: DC

## 2020-08-18 MED ORDER — PANTOPRAZOLE SODIUM 40 MG PO TBEC
40.0000 mg | DELAYED_RELEASE_TABLET | Freq: Every day | ORAL | Status: DC
  Administered 2020-08-18 – 2020-08-22 (×5): 40 mg via ORAL
  Filled 2020-08-18 (×5): qty 1

## 2020-08-18 MED ORDER — INSULIN ASPART 100 UNIT/ML ~~LOC~~ SOLN: 0.0000 [IU] | Freq: Every day | SUBCUTANEOUS | Status: DC

## 2020-08-18 MED ORDER — ASPIRIN EC 81 MG PO TBEC
81.0000 mg | DELAYED_RELEASE_TABLET | Freq: Every day | ORAL | Status: DC
  Administered 2020-08-18 – 2020-08-22 (×5): 81 mg via ORAL
  Filled 2020-08-18 (×5): qty 1

## 2020-08-18 MED ORDER — QUETIAPINE FUMARATE 100 MG PO TABS: 100.0000 mg | ORAL_TABLET | Freq: Every day | ORAL | Status: DC

## 2020-08-18 MED ORDER — MIRTAZAPINE 15 MG PO TABS
45.0000 mg | ORAL_TABLET | Freq: Every day | ORAL | Status: DC
  Administered 2020-08-18 – 2020-08-21 (×4): 45 mg via ORAL
  Filled 2020-08-18 (×4): qty 3

## 2020-08-18 NOTE — ED Notes (Signed)
Pt cleaned after urine leaked out of external cath at this time. Pt provided with clean linens, chux, and new external cath at this time. Pt given warm blanket at this time

## 2020-08-18 NOTE — ED Provider Notes (Signed)
[facility] Emergency Department Provider Note   ____________________________________________   Event Date/Time   First MD Initiated Contact with Patient 08/18/20 0132     (approximate)  I have reviewed the triage vital signs and the nursing notes.   HISTORY  Chief Complaint No chief complaint on file.    HPI Michael Wall is a 60 y.o. male with history of mental retardation, CAD on Plavix, diabetes, hypertension, schizophrenia who lives at a group home who presents to the emergency department with EMS after a dresser fell on him tonight.  I spoke to group home staff member Kathyrn Drown who states that this was unwitnessed.  She states a Teaching laboratory technician and a TV fell on top of the patient.  Does not appear patient lost consciousness.  He has abrasions to bilateral knees but no other sign of injury on exam.    Found to be hypotensive with EMS.  Group home staff reports blood pressures normally in the 120s to 130s/70s to 80s.  He is on lisinopril and diltiazem.  No changes in his blood pressure medications.  She reports  he has not been eating or drinking well over the last week.  She states his blood pressure on 08/12/2020 was 87/49.  He did not come to the hospital at that time.  She states this is the lowest she has seen his blood pressure.  Denies any fevers, cough, vomiting or diarrhea.  No one at their group home is positive for COVID-19.  She reports he is tested weekly.  He did see his family over the Christmas holidays.  Otherwise no exposures.  Patient denies having any headache, neck or back pain, chest or abdominal pain.      Past Medical History:  Diagnosis Date  . Arthritis   . Coronary artery disease   . Diabetes ([place])   . Dyspnea   . GERD (gastroesophageal reflux disease)   . HBP (high blood pressure)   . Myocardial infarction ([place])   . Schizophrenia [facility])     Patient Active Problem List   Diagnosis Date Noted  . Unstable angina  ([place]) 12/25/2018  . Two-vessel coronary artery disease 12/25/2018  . Coronary artery disease 12/03/2017  . HBP (high blood pressure) 12/03/2017  . Diabetes ([place]) 12/03/2017  . Ischemic colitis ([place]) 12/03/2017  . Dyspnea 02/25/2017  . Acute myocardial infarction ([place]) 02/25/2017  . Esophageal reflux disease 02/25/2017  . History of type 2 diabetes mellitus 02/25/2017  . Hx of coronary artery disease 02/25/2017    Past Surgical History:  Procedure Laterality Date  . CARDIAC CATHETERIZATION    . COLONOSCOPY WITH PROPOFOL N/A 05/27/2017   Procedure: COLONOSCOPY WITH PROPOFOL;  Surgeon: Manya Silvas, MD;  [facility] END[facility]COPY;  Service: Endoscopy;  Laterality: N/A;  . COLONOSCOPY WITH PROPOFOL N/A 10/21/2017   Procedure: COLONOSCOPY WITH PROPOFOL;  Surgeon: Manya Silvas, MD;  Location: [facility] ENDOSCOPY;  Service: Endoscopy;  Laterality: N/A;  . CORONARY ANGIOPLASTY    . FINGER AMPUTATION Left   . LEFT HEART CATH AND CORONARY ANGIOGRAPHY Left 01/02/2019   Procedure: LEFT HEART CATH AND CORONARY ANGIOGRAPHY;  Surgeon: Dionisio David, MD;  Location: [place] CV LAB;  Service: Cardiovascular;  Laterality: Left;    Prior to Admission medications   Medication Sig Start Date End Date Taking? Authorizing Provider  acetaminophen (TYLENOL) 500 MG tablet Take 1,000 mg by mouth every 4 (four) hours as needed.    [provider]  aspirin EC  81 MG tablet Take 81 mg by mouth daily.    [provider]  benztropine (COGENTIN) 2 MG tablet Take 2 mg by mouth 2 (two) times daily.    [provider]  clopidogrel (PLAVIX) 75 MG tablet Take 75 mg by mouth daily with breakfast.    [provider]  diltiazem (CARDIZEM CD) 120 MG 24 hr capsule Take 120 mg by mouth daily.  05/09/18   [provider]  Dulaglutide (TRULICITY) 0.75 MG/0.5ML SOPN Inject 0.75 mg into the skin every Friday.    [provider]  haloperidol (HALDOL) 10 MG tablet Take 10  mg by mouth 3 (three) times daily.  01/31/18   [provider]  JARDIANCE 25 MG TABS tablet Take 25 mg by mouth daily.     [provider]  lisinopril (PRINIVIL,ZESTRIL) 20 MG tablet Take 40 mg by mouth daily.     [provider]  metFORMIN (GLUCOPHAGE) 500 MG tablet Take 500 mg by mouth 2 (two) times daily. 08/17/19   [provider]  metoprolol (LOPRESSOR) 100 MG tablet Take 100 mg by mouth 2 (two) times daily.     [provider]  mirtazapine (REMERON) 45 MG tablet Take 45 mg by mouth at bedtime.  11/24/18   [provider]  Multiple Vitamin (MULTIVITAMIN) tablet Take 1 tablet by mouth daily.    [provider]  pantoprazole (PROTONIX) 40 MG tablet Take 40 mg by mouth daily.  06/22/16   [provider]  QUEtiapine (SEROQUEL) 100 MG tablet Take 100 mg by mouth at bedtime.     [provider]  rosuvastatin (CRESTOR) 40 MG tablet Take 40 mg by mouth every evening.  11/24/18   [provider]  terbinafine (LAMISIL) 250 MG tablet Take 250 mg by mouth.  06/11/16   [provider]  UNABLE TO FIND  02/06/19   [provider]    Allergies Patient has no known allergies.  Family History  Problem Relation Age of Onset  . Heart disease Mother   . Stroke Father   . Heart disease Father     Social History Social History   Tobacco Use  . Smoking status: Former Games developer  . Smokeless tobacco: Never Used  . Tobacco comment: quit 1946  Vaping Use  . Vaping Use: Never used  Substance Use Topics  . Alcohol use: No  . Drug use: No    Review of Systems Level 5 caveat secondary to patient's mental retardation and schizophrenia  ____________________________________________   PHYSICAL EXAM:  VITAL SIGNS: ED Triage Vitals  Enc Vitals Group     BP      Pulse      Resp      Temp      Temp src      SpO2      Weight      Height      Head Circumference      Peak Flow      Pain Score       Pain Loc      Pain Edu?      Excl. in GC?     Constitutional: Alert and oriented to person and place but not time or situation.  Thin, chronically ill-appearing. Eyes: Conjunctivae are normal. PERRL. EOMI. no conjunctival pallor. Head: Atraumatic. Nose: No congestion/rhinnorhea. Mouth/Throat: Mucous membranes are dry.  Oropharynx non-erythematous. Neck: No stridor.  No cervical spine tenderness to palpation.  No midline step-off or deformity. Cardiovascular: Normal rate,  regular rhythm. Grossly normal heart sounds.  Good peripheral circulation.  Chest wall nontender to palpation without crepitus, ecchymosis or deformity. Respiratory: Normal respiratory effort.  No retractions. Lungs CTAB. Gastrointestinal: Soft and nontender. No distention. No abdominal bruits. No CVA tenderness.  No ecchymosis. Musculoskeletal: No lower extremity tenderness nor edema.  No joint effusions.  Abrasions to bilateral knees.  No bony tenderness or bony deformity. Neurologic:  Normal speech and language. No gross focal neurologic deficits are appreciated.  Skin:  Skin is warm, dry and intact. No rash noted. Psychiatric: Mood and affect are normal. Speech and behavior are normal.  ____________________________________________   LABS (all labs ordered are listed, but only abnormal results are displayed)  Labs Reviewed  CBC WITH DIFFERENTIAL/PLATELET  URINALYSIS, ROUTINE W REFLEX MICROSCOPIC  COMPREHENSIVE METABOLIC PANEL   ____________________________________________  EKG   Date: 08/18/2020 00:58  Rate: 92  Rhythm: normal sinus rhythm  QRS Axis: normal  Intervals: normal  ST/T Wave abnormalities: Unspecific T wave abnormality in anterior leads  Conduction Disutrbances: none  Narrative Interpretation: Nonspecific T wave abnormalities in anterior leads     ____________________________________________  RADIOLOGY  ED MD interpretation: Head CT shows no acute intracranial abnormality.  Official  radiology report(s): CT Head Wo Contrast  Result Date: 08/18/2020 CLINICAL DATA:  Larey Seat, hypotension EXAM: CT HEAD WITHOUT CONTRAST TECHNIQUE: Contiguous axial images were obtained from the base of the skull through the vertex without intravenous contrast. COMPARISON:  None. FINDINGS: Brain: No acute infarct or hemorrhage. Lateral ventricles and midline structures are unremarkable. No acute extra-axial fluid collections. No mass effect. Vascular: No hyperdense vessel or unexpected calcification. Skull: Normal. Negative for fracture or focal lesion. Sinuses/Orbits: No acute finding. Other: None. IMPRESSION: 1. No acute intracranial process. Electronically Signed   By: Sharlet Salina M.D.   On: 08/18/2020 03:45    ____________________________________________   PROCEDURES  Procedure(s) performed: None  Procedures  Critical Care performed: Yes, see critical care note(s)  ____________________________________________   INITIAL IMPRESSION / ASSESSMENT AND PLAN / ED COURSE     Patient here after he had a dresser fall on top of him.  He denies any head injury but history limited due to MR and schizophrenia.  Will obtain CT of his head given he is on Plavix.  No other sign of trauma on exam other than abrasions to bilateral knees.  Also found to have low blood pressure with EMS.  Received 700 mL of IV fluids.  Will check labs, urine.  Rectal temp is 97.7.  Low suspicion for sepsis.  EKG shows no ischemic abnormality.  Doubt ACS, PE, dissection.  We will continue to monitor closely.     3:00 AM  Pt's blood pressure continues to be low after 1 L of IV fluid.  He does have elevation of his creatinine and BUN today compared to previous labs in 2019.  Mild leukocytosis with left shift but no fever or obvious infectious etiology at this time.  Urine pending.  Will give second liter of fluid.  Head CT pending.  3:50 AM  Pt's head CT shows no acute intracranial abnormality.  I feel he needs admission for  hypotension, AKI in the setting of what I suspect is hypovolemia due to poor p.o. intake.  Will obtain Covid swab.  Will discuss with medicine for admission.  4:47 AM Discussed patient's case with hospitalist, Dr. Para March.  I have recommended admission and patient (and family if present) agree with this plan. Admitting physician will place admission orders.  I reviewed all nursing notes, vitals, pertinent previous records and reviewed/interpreted all EKGs, lab and urine results, imaging (as available).  4:53 AM  Pt does appear to have a UTI.  I have added on a urine culture.  No previous urine cultures in our system.  Will give IV Rocephin.  Differential also includes urosepsis although patient is extremely well-appearing, afebrile here. _____________________  CRITICAL CARE Performed by: Rochele Raring   Total critical care time: 45 minutes  Critical care time was exclusive of separately billable procedures and treating other patients.  Critical care was necessary to treat or prevent imminent or life-threatening deterioration.  Critical care was time spent personally by me on the following activities: development of treatment plan with patient and/or surrogate as well as nursing, discussions with consultants, evaluation of patient's response to treatment, examination of patient, obtaining history from patient or surrogate, ordering and performing treatments and interventions, ordering and review of laboratory studies, ordering and review of radiographic studies, pulse oximetry and re-evaluation of patient's condition. _______________________   FINAL CLINICAL IMPRESSION(S) / ED DIAGNOSES  Final diagnoses:  Fall, initial encounter  Hypotension due to hypovolemia  AKI (acute kidney injury) (HCC)  Acute UTI     ED Discharge Orders    None       Note:  This document was prepared using Dragon voice recognition software and may include unintentional dictation errors.    Merrily Tegeler, Layla Maw, DO 08/18/20 [phone]

## 2020-08-18 NOTE — ED Triage Notes (Signed)
Pt to ED via EMS from group home c/o fall today and hypotension.  Pt was walking and started to fall and grabbed dresser in room which fell on top of him, abrasions to bilat knees which is only pain pt complaining of.  Pt states losing a lot of weight recently but is still eating like normal.  Per EMS pt found to be hypotensive 80/40 and given 700cc NS bolus.  Group home staff states patient has been more pale, unsteady on feet, and eating less than normal.  EMS CBG 193, 99% RA, 94 HR.  Presents A&O to baseline, chest rise even and unlabored, in NAD at this time.  Group home # [phone]

## 2020-08-18 NOTE — ED Notes (Signed)
Per Ian Malkin RN and Sheffield Slider RN, pt PIV was not working so it was removed and bandaged at this time. Per Ian Malkin RN and Sheffield Slider RN, they changed pt, replaced external cath, and provided pt with clean chux and linens at this time.   Pt given warm blanket by this RN at this time.

## 2020-08-18 NOTE — ED Notes (Signed)
Pt given cup of ice water per request at this time

## 2020-08-18 NOTE — ED Notes (Signed)
Pt given meal tray at this time 

## 2020-08-18 NOTE — Progress Notes (Signed)
CODE SEPSIS - PHARMACY COMMUNICATION  **Broad Spectrum Antibiotics should be administered within 1 hour of Sepsis diagnosis**  Time Code Sepsis Called/Page Received: 6837  Antibiotics Ordered: Rocephin  Time of 1st antibiotic administration: 0531 (already given)  Additional action taken by pharmacy: n/a  If necessary, Name of Provider/Nurse Contacted: na    Wayland Denis ,PharmD Clinical Pharmacist  08/18/2020  6:58 AM

## 2020-08-18 NOTE — ED Triage Notes (Signed)
Pt to ED via EMS from group home c/o fall today and hypotension.  Pt was walking and started to fall and grabbed dresser in room which fell on top of him, abrasions to bilat knees which is only pain pt complaining of.  Pt states losing a lot of weight recently but is still eating like normal.  Per EMS pt found to be hypotensive 80/40 and given 700cc NS bolus.  Group home staff states patient has been more pale, unsteady on feet, and eating less than normal.  EMS CBG 193, 99% RA, 94 HR.  Presents A&O to baseline, chest rise even and unlabored, in NAD at this time.  Group home # [phone]  

## 2020-08-18 NOTE — H&P (Addendum)
History and Physical    Michael Wall DDU:[id number] DOB: 07-Jan-1961 DOA: 08/18/2020  PCP: Margaretann Loveless, MD  Patient coming from: group home [facility]   Chief Complaint: fall  HPI: Michael Wall is a 60 y.o. male with medical history significant for mental retardation, schizophrenia, htn, cm, cad, ischemic colitis, who presents with the above.  Hx limited by patient's MR. He says he feels fine. Denies pain. Denies knee pain, denies abdominal pain, denies nausea/vomiting, denies diarrhea, denies chest pain, denies fever or cough or chills. He recalls falling yesterday, is unsure why.  I spoke with his contact sharon who is in charge of his group home though she does not work there. She says Blase Mess [phone]) is in charge of the group home where he lives, but no answer at that number. She reports Blase Mess has reported several weeks of decreased appetite, just picking at his food. Also occasional reports of nausea, no reports of vomiting or diarrhea. No med list present.  ED Course:   Was hypotensive via EMS and initial pressures in ED were 90s systolic. Given 2 L NS, ceftriaxone. Labs show aki. Blood pressures improved with fluids.   Review of Systems: As per HPI otherwise 10 point review of systems negative.    Past Medical History:  Diagnosis Date  . Arthritis   . Coronary artery disease   . Diabetes (HCC)   . Dyspnea   . GERD (gastroesophageal reflux disease)   . HBP (high blood pressure)   . Myocardial infarction (HCC)   . Schizophrenia [facility])     Past Surgical History:  Procedure Laterality Date  . CARDIAC CATHETERIZATION    . COLONOSCOPY WITH PROPOFOL N/A 05/27/2017   Procedure: COLONOSCOPY WITH PROPOFOL;  Surgeon: Scot Jun, MD;  Location: [facility] ENDOSCOPY;  Service: Endoscopy;  Laterality: N/A;  . COLONOSCOPY WITH PROPOFOL N/A 10/21/2017   Procedure: COLONOSCOPY WITH PROPOFOL;  Surgeon: Scot Jun, MD;  Location: [facility] ENDOSCOPY;   Service: Endoscopy;  Laterality: N/A;  . CORONARY ANGIOPLASTY    . FINGER AMPUTATION Left   . LEFT HEART CATH AND CORONARY ANGIOGRAPHY Left 01/02/2019   Procedure: LEFT HEART CATH AND CORONARY ANGIOGRAPHY;  Surgeon: Laurier Nancy, MD;  Location: [facility] INVASIVE CV LAB;  Service: Cardiovascular;  Laterality: Left;     reports that he has quit smoking. He has never used smokeless tobacco. He reports that he does not drink alcohol and does not use drugs.  No Known Allergies  Family History  Problem Relation Age of Onset  . Heart disease Mother   . Stroke Father   . Heart disease Father     Prior to Admission medications   Medication Sig Start Date End Date Taking? Authorizing Provider  acetaminophen (TYLENOL) 500 MG tablet Take 1,000 mg by mouth every 4 (four) hours as needed.    [provider]  aspirin EC 81 MG tablet Take 81 mg by mouth daily.    [provider]  benztropine (COGENTIN) 2 MG tablet Take 2 mg by mouth 2 (two) times daily.    [provider]  clopidogrel (PLAVIX) 75 MG tablet Take 75 mg by mouth daily with breakfast.    [provider]  diltiazem (CARDIZEM CD) 120 MG 24 hr capsule Take 120 mg by mouth daily.  05/09/18   [provider]  Dulaglutide (TRULICITY) 0.75 MG/0.5ML SOPN Inject 0.75 mg into the skin every Friday.    [provider]  haloperidol (HALDOL) 10  MG tablet Take 10 mg by mouth 3 (three) times daily.  01/31/18   [provider]  JARDIANCE 25 MG TABS tablet Take 25 mg by mouth daily.     [provider]  lisinopril (PRINIVIL,ZESTRIL) 20 MG tablet Take 40 mg by mouth daily.     [provider]  metFORMIN (GLUCOPHAGE) 500 MG tablet Take 500 mg by mouth 2 (two) times daily. 08/17/19   [provider]  metoprolol (LOPRESSOR) 100 MG tablet Take 100 mg by mouth 2 (two) times daily.     [provider]  mirtazapine (REMERON) 45 MG tablet Take 45 mg by mouth at bedtime.   11/24/18   [provider]  Multiple Vitamin (MULTIVITAMIN) tablet Take 1 tablet by mouth daily.    [provider]  pantoprazole (PROTONIX) 40 MG tablet Take 40 mg by mouth daily.  06/22/16   [provider]  QUEtiapine (SEROQUEL) 100 MG tablet Take 100 mg by mouth at bedtime.     [provider]  rosuvastatin (CRESTOR) 40 MG tablet Take 40 mg by mouth every evening.  11/24/18   [provider]  terbinafine (LAMISIL) 250 MG tablet Take 250 mg by mouth.  06/11/16   [provider]  UNABLE TO FIND  02/06/19   [provider]    Physical Exam: Vitals:   08/18/20 0530 08/18/20 0600 08/18/20 0630 08/18/20 0700  BP: 108/81 122/65 140/68   Pulse: 93 96  98  Resp: 15 (!) 25 (!) 24 19  Temp:      TempSrc:      SpO2: 100% 100%  100%  Weight:      Height:        Constitutional: No acute distress Head: Atraumatic Eyes: Conjunctiva clear ENM: Moist mucous membranes. Normal dentition.  Neck: Supple Respiratory: Clear to auscultation bilaterally, no wheezing/rales/rhonchi. Normal respiratory effort. No accessory muscle use. . Cardiovascular: Regular rate and rhythm. No murmurs/rubs/gallops. Abdomen: Non-tender, non-distended. No masses. No rebound or guarding. Positive bowel sounds. Musculoskeletal: no tenderness at site of bruise left knee, normal ROM Skin: bruise inferior left knee Extremities: No peripheral edema. Palpable peripheral pulses. Neurologic: Alert, moving all 4 extremities. Psychiatric: calm   Labs on Admission: I have personally reviewed following labs and imaging studies  CBC: Recent Labs  Lab 08/18/20 0207  WBC 11.7*  NEUTROABS 9.4*  HGB 10.3*  HCT 30.6*  MCV 83.8  PLT 585   Basic Metabolic Panel: Recent Labs  Lab 08/18/20 0207  NA 137  K 4.1  CL 107  CO2 20*  GLUCOSE 140*  BUN 55*  CREATININE 1.97*  CALCIUM 8.4*   GFR: Estimated Creatinine Clearance: 33.3 mL/min (A) (by C-G formula based  on SCr of 1.97 mg/dL (H)). Liver Function Tests: Recent Labs  Lab 08/18/20 0207  AST 41  ALT 48*  ALKPHOS 61  BILITOT 0.5  PROT 5.6*  ALBUMIN 2.8*   No results for input(s): LIPASE, AMYLASE in the last 168 hours. No results for input(s): AMMONIA in the last 168 hours. Coagulation Profile: No results for input(s): INR, PROTIME in the last 168 hours. Cardiac Enzymes: No results for input(s): CKTOTAL, CKMB, CKMBINDEX, TROPONINI in the last 168 hours. BNP (last 3 results) No results for input(s): PROBNP in the last 8760 hours. HbA1C: No results for input(s): HGBA1C in the last 72 hours. CBG: No results for input(s): GLUCAP in the last 168 hours. Lipid Profile: No results for input(s): CHOL, HDL, LDLCALC, TRIG, CHOLHDL, LDLDIRECT in the  last 72 hours. Thyroid Function Tests: No results for input(s): TSH, T4TOTAL, FREET4, T3FREE, THYROIDAB in the last 72 hours. Anemia Panel: No results for input(s): VITAMINB12, FOLATE, FERRITIN, TIBC, IRON, RETICCTPCT in the last 72 hours. Urine analysis:    Component Value Date/Time   COLORURINE AMBER (A) 08/18/2020 0412   APPEARANCEUR CLOUDY (A) 08/18/2020 0412   LABSPEC 1.017 08/18/2020 0412   PHURINE 7.0 08/18/2020 0412   GLUCOSEU >=500 (A) 08/18/2020 0412   HGBUR MODERATE (A) 08/18/2020 0412   BILIRUBINUR NEGATIVE 08/18/2020 0412   KETONESUR NEGATIVE 08/18/2020 0412   PROTEINUR 100 (A) 08/18/2020 0412   NITRITE NEGATIVE 08/18/2020 0412   LEUKOCYTESUR LARGE (A) 08/18/2020 0412    Radiological Exams on Admission: CT Head Wo Contrast  Result Date: 08/18/2020 CLINICAL DATA:  Larey Seat, hypotension EXAM: CT HEAD WITHOUT CONTRAST TECHNIQUE: Contiguous axial images were obtained from the base of the skull through the vertex without intravenous contrast. COMPARISON:  None. FINDINGS: Brain: No acute infarct or hemorrhage. Lateral ventricles and midline structures are unremarkable. No acute extra-axial fluid collections. No mass effect. Vascular: No  hyperdense vessel or unexpected calcification. Skull: Normal. Negative for fracture or focal lesion. Sinuses/Orbits: No acute finding. Other: None. IMPRESSION: 1. No acute intracranial process. Electronically Signed   By: Sharlet Salina M.D.   On: 08/18/2020 03:45    EKG: Independently reviewed. Nsr, qtc 507  Assessment/Plan Active Problems:   Coronary artery disease   HBP (high blood pressure)   Hx of coronary artery disease   UTI (urinary tract infection)   Hypotension   # Fall Unwitnessed fall, per report perhaps a dresser fell on him. Only apparent injury is bruise to left knee that is not tender. CTH w/o acute findings. Possibly related to below issues. - will check x-ray of left knee  # Hypotension # Nausea # History ischemic colitis # Acute kidney injury Although patient has no complaints and no reports of nausea, vomiting, or diarrhea here, there is report of at least a few weeks of decreased appetite and patient reports of transient nausea. Given history of ischemic colitis this is certainly on ddx, though will need for kidney function to improve prior to obtaining imaging. Other possibilities include med side effect, other GI pathology, infection. Hypotension resolved with IV fluids.  - would consider CT of abdomen/pelvis w/ IV and oral contrast when kidney function improves - will check AM cortisol, tsh, lipase and complete infectious w/u with cxr and blood cultures - pm addendum: I was able to reach worker at group home and conformed home meds in our system are accurate  # AKI Likely prerenal from reduced PO. S/p 2 L in the ED, patient able to eat/drink - cont fluids @ 100 - trend kidney function  # Bacteriuria No report of cystitis symptoms but pt an unreliable historian and was hypotensive on arrival - will f/u urine culture and continue ceftriaxone - f/u blood cultures (obtained after abx)  # Normocytic anemia H 10.3 from normal prior. Etiology unclear, does have  hx ischemic colitis - will check iron panel, fecal occult blood, and trend. Continue asa/plavix for now, unclear why on dapt  # CAD Hx non-obstructive CAD on 2020 cath, med mgmt advised - cont home rosuvastatin, metoprolol, aspirin, plavix (unclear why on dapt)  # HTN Here normotensive initially - hold home dilt and lisinopril, cont metop  # Mental Retardation # Schizophrenia - cont home haldol, remeron, seroquel  # T2DM Here glucose mildly elevated - hold home jardiance, trulicity,  metformin - cont SSI  DVT prophylaxis: heparin Code Status: full  Family Communication: group home director updated 1/6  Consults called: none    Status is: Observation  The patient remains OBS appropriate and will d/c before 2 midnights.  Dispo: The patient is from: Group home              Anticipated d/c is to: Group home              Anticipated d/c date is: 1-2 days              Patient currently is not medically stable to d/c.        Silvano Bilis MD [facility] Pager [phone]  If 7PM-7AM, please contact night-coverage [email or web address] Password [facility]  08/18/2020, 7:55 AM

## 2020-08-18 NOTE — Progress Notes (Signed)
Completed skin assessment with Michael Oka RN patient stated previous fall before admission left knee bruise (old) bilateral skin abrasions r/t fall. Bilateral arm scratches

## 2020-08-19 ENCOUNTER — Encounter: Payer: Self-pay | Admitting: Obstetrics and Gynecology

## 2020-08-19 DIAGNOSIS — I951 Orthostatic hypotension: Secondary | ICD-10-CM | POA: Diagnosis present

## 2020-08-19 DIAGNOSIS — D509 Iron deficiency anemia, unspecified: Secondary | ICD-10-CM | POA: Diagnosis present

## 2020-08-19 DIAGNOSIS — I2510 Atherosclerotic heart disease of native coronary artery without angina pectoris: Secondary | ICD-10-CM | POA: Diagnosis present

## 2020-08-19 DIAGNOSIS — Z79899 Other long term (current) drug therapy: Secondary | ICD-10-CM | POA: Diagnosis not present

## 2020-08-19 DIAGNOSIS — Z8679 Personal history of other diseases of the circulatory system: Secondary | ICD-10-CM | POA: Diagnosis not present

## 2020-08-19 DIAGNOSIS — F209 Schizophrenia, unspecified: Secondary | ICD-10-CM | POA: Diagnosis present

## 2020-08-19 DIAGNOSIS — Y92193 Bedroom in other specified residential institution as the place of occurrence of the external cause: Secondary | ICD-10-CM | POA: Diagnosis not present

## 2020-08-19 DIAGNOSIS — N179 Acute kidney failure, unspecified: Secondary | ICD-10-CM | POA: Diagnosis present

## 2020-08-19 DIAGNOSIS — F79 Unspecified intellectual disabilities: Secondary | ICD-10-CM | POA: Diagnosis present

## 2020-08-19 DIAGNOSIS — E43 Unspecified severe protein-calorie malnutrition: Secondary | ICD-10-CM | POA: Diagnosis present

## 2020-08-19 DIAGNOSIS — Z7902 Long term (current) use of antithrombotics/antiplatelets: Secondary | ICD-10-CM | POA: Diagnosis not present

## 2020-08-19 DIAGNOSIS — N390 Urinary tract infection, site not specified: Secondary | ICD-10-CM | POA: Diagnosis present

## 2020-08-19 DIAGNOSIS — M1990 Unspecified osteoarthritis, unspecified site: Secondary | ICD-10-CM | POA: Diagnosis present

## 2020-08-19 DIAGNOSIS — S80211A Abrasion, right knee, initial encounter: Secondary | ICD-10-CM | POA: Diagnosis present

## 2020-08-19 DIAGNOSIS — S8002XA Contusion of left knee, initial encounter: Secondary | ICD-10-CM | POA: Diagnosis present

## 2020-08-19 DIAGNOSIS — Z7982 Long term (current) use of aspirin: Secondary | ICD-10-CM | POA: Diagnosis not present

## 2020-08-19 DIAGNOSIS — E876 Hypokalemia: Secondary | ICD-10-CM | POA: Diagnosis not present

## 2020-08-19 DIAGNOSIS — E861 Hypovolemia: Secondary | ICD-10-CM

## 2020-08-19 DIAGNOSIS — K219 Gastro-esophageal reflux disease without esophagitis: Secondary | ICD-10-CM | POA: Diagnosis present

## 2020-08-19 DIAGNOSIS — I252 Old myocardial infarction: Secondary | ICD-10-CM | POA: Diagnosis not present

## 2020-08-19 DIAGNOSIS — E119 Type 2 diabetes mellitus without complications: Secondary | ICD-10-CM | POA: Diagnosis present

## 2020-08-19 DIAGNOSIS — I9589 Other hypotension: Secondary | ICD-10-CM

## 2020-08-19 DIAGNOSIS — Z681 Body mass index (BMI) 19 or less, adult: Secondary | ICD-10-CM | POA: Diagnosis not present

## 2020-08-19 DIAGNOSIS — W208XXA Other cause of strike by thrown, projected or falling object, initial encounter: Secondary | ICD-10-CM | POA: Diagnosis present

## 2020-08-19 DIAGNOSIS — Z20822 Contact with and (suspected) exposure to covid-19: Secondary | ICD-10-CM | POA: Diagnosis present

## 2020-08-19 DIAGNOSIS — R748 Abnormal levels of other serum enzymes: Secondary | ICD-10-CM | POA: Diagnosis present

## 2020-08-19 DIAGNOSIS — I10 Essential (primary) hypertension: Secondary | ICD-10-CM | POA: Diagnosis present

## 2020-08-19 DIAGNOSIS — B957 Other staphylococcus as the cause of diseases classified elsewhere: Secondary | ICD-10-CM | POA: Diagnosis present

## 2020-08-19 LAB — COMPREHENSIVE METABOLIC PANEL
ALT: 123 U/L — ABNORMAL HIGH (ref 0–44)
AST: 138 U/L — ABNORMAL HIGH (ref 15–41)
Albumin: 2.9 g/dL — ABNORMAL LOW (ref 3.5–5.0)
Alkaline Phosphatase: 68 U/L (ref 38–126)
Anion gap: 10 (ref 5–15)
BUN: 25 mg/dL — ABNORMAL HIGH (ref 6–20)
CO2: 24 mmol/L (ref 22–32)
Calcium: 8.5 mg/dL — ABNORMAL LOW (ref 8.9–10.3)
Chloride: 109 mmol/L (ref 98–111)
Creatinine, Ser: 1.12 mg/dL (ref 0.61–1.24)
GFR, Estimated: >60 mL/min (ref >60)
Glucose, Bld: 100 mg/dL — ABNORMAL HIGH (ref 70–99)
Potassium: 3.1 mmol/L — ABNORMAL LOW (ref 3.5–5.1)
Sodium: 143 mmol/L (ref 135–145)
Total Bilirubin: 0.7 mg/dL (ref 0.3–1.2)
Total Protein: 6.3 g/dL — ABNORMAL LOW (ref 6.5–8.1)

## 2020-08-19 LAB — CBG MONITORING, ED
Glucose-Capillary: 88 mg/dL (ref 70–99)
Glucose-Capillary: 84 mg/dL (ref 70–99)
Glucose-Capillary: 178 mg/dL — ABNORMAL HIGH (ref 70–99)

## 2020-08-19 LAB — PROTIME-INR
INR: 1 (ref 0.8–1.2)
Prothrombin Time: 13.2 seconds (ref 11.4–15.2)

## 2020-08-19 LAB — CBC
HCT: 34.5 % — ABNORMAL LOW (ref 39.0–52.0)
Hemoglobin: 11.3 g/dL — ABNORMAL LOW (ref 13.0–17.0)
MCH: 27.8 pg (ref 26.0–34.0)
MCHC: 32.8 g/dL (ref 30.0–36.0)
MCV: 85 fL (ref 80.0–100.0)
Platelets: 227 10*3/uL (ref 150–400)
RBC: 4.06 MIL/uL — ABNORMAL LOW (ref 4.22–5.81)
RDW: 13 % (ref 11.5–15.5)
WBC: 8.6 10*3/uL (ref 4.0–10.5)
nRBC: 0 % (ref 0.0–0.2)

## 2020-08-19 LAB — PROCALCITONIN: Procalcitonin: 0.13 ng/mL

## 2020-08-19 LAB — GLUCOSE, CAPILLARY
Glucose-Capillary: 172 mg/dL — ABNORMAL HIGH (ref 70–99)
Glucose-Capillary: 138 mg/dL — ABNORMAL HIGH (ref 70–99)
Glucose-Capillary: 105 mg/dL — ABNORMAL HIGH (ref 70–99)

## 2020-08-19 LAB — CORTISOL-AM, BLOOD: Cortisol - AM: 21.9 ug/dL (ref 6.7–22.6)

## 2020-08-19 LAB — MAGNESIUM: Magnesium: 1.5 mg/dL — ABNORMAL LOW (ref 1.7–2.4)

## 2020-08-19 MED ORDER — ENSURE ENLIVE PO LIQD
237.0000 mL | Freq: Three times a day (TID) | ORAL | Status: DC
  Administered 2020-08-19 – 2020-08-22 (×8): 237 mL via ORAL

## 2020-08-19 MED ORDER — FERROUS SULFATE 325 (65 FE) MG PO TABS
325.0000 mg | ORAL_TABLET | ORAL | Status: DC
  Administered 2020-08-20 – 2020-08-22 (×2): 325 mg via ORAL
  Filled 2020-08-19 (×3): qty 1

## 2020-08-19 MED ORDER — POTASSIUM CHLORIDE CRYS ER 20 MEQ PO TBCR
40.0000 meq | EXTENDED_RELEASE_TABLET | ORAL | Status: AC
  Administered 2020-08-19 (×2): 40 meq via ORAL
  Filled 2020-08-19 (×2): qty 2

## 2020-08-19 MED ORDER — ADULT MULTIVITAMIN W/MINERALS CH
1.0000 | ORAL_TABLET | Freq: Every day | ORAL | Status: DC
  Administered 2020-08-20 – 2020-08-22 (×3): 1 via ORAL
  Filled 2020-08-19 (×3): qty 1

## 2020-08-19 NOTE — TOC Initial Note (Signed)
Transition of Care [facility]) - Initial/Assessment Note    Patient Details  Name: Michael Wall MRN: [id number] Date of Birth: 1961/02/22  Transition of Care [facility]) CM/SW Contact:    Chapman Fitch, RN Phone Number: 08/19/2020, 2:53 PM  Clinical Narrative:                 Patient admitted from [facility].    Patient states that he has lived there 2-3 years.  Has 2 sisters, however they are not involved  Patient confirms he wishes to return at baseline.   At baseline patient is ambulatory with out any assistive device.    TOC called Autumn Messing [phone] who is the administrator at the group home.  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 Confirms that he can return at discharge.  She states that she is on this weekend so if patient is ready for discharge to call her directly.  Will need Fl2 with updated meds at discharge   Expected Discharge Plan: Group Home Barriers to Discharge: Continued Medical Work up   Patient Goals and CMS Choice        Expected Discharge Plan and Services Expected Discharge Plan: Group Home       Living arrangements for the past 2 months: Group Home                                      Prior Living Arrangements/Services Living arrangements for the past 2 months: Group Home Lives with:: Facility Resident Patient language and need for interpreter reviewed:: Yes        Need for Family Participation in Patient Care: Yes (Comment) Care giver support system in place?: Yes (comment)   Criminal Activity/Legal Involvement Pertinent to Current Situation/Hospitalization: No - Comment as needed  Activities of Daily Living Home Assistive Devices/Equipment: None ADL Screening (condition at time of admission) Patient's cognitive ability adequate to safely complete daily activities?: Yes Is the patient deaf or have difficulty hearing?: No Does the patient have difficulty seeing, even when wearing glasses/contacts?:  No Does the patient have difficulty concentrating, remembering, or making decisions?: No Patient able to express need for assistance with ADLs?: Yes Does the patient have difficulty dressing or bathing?: No Independently performs ADLs?: Yes (appropriate for developmental age) Does the patient have difficulty walking or climbing stairs?: No Weakness of Legs: None Weakness of Arms/Hands: None  Permission Sought/Granted                  Emotional Assessment   Attitude/Demeanor/Rapport: Gracious Affect (typically observed): Accepting Orientation: : Oriented to Self,Oriented to Place,Oriented to  Time,Oriented to Situation      Admission diagnosis:  UTI (urinary tract infection) [N39.0] Pain [R52] Acute UTI [N39.0] AKI (acute kidney injury) (HCC) [N17.9] Hypotension [I95.9] Fall, initial encounter [W19.XXXA] Hypotension due to hypovolemia [I95.89, E86.1] Patient Active Problem List   Diagnosis Date Noted  . UTI (urinary tract infection) 08/18/2020  . Hypotension 08/18/2020  . Unstable angina (HCC) 12/25/2018  . Two-vessel coronary artery disease 12/25/2018  . Coronary artery disease 12/03/2017  . HBP (high blood pressure) 12/03/2017  . Diabetes (HCC) 12/03/2017  . Ischemic colitis (HCC) 12/03/2017  . Dyspnea 02/25/2017  . Acute myocardial infarction (HCC) 02/25/2017  . Esophageal reflux disease 02/25/2017  . History of type 2 diabetes mellitus 02/25/2017  . Hx of coronary artery disease 02/25/2017   PCP:  Margaretann Loveless, MD Pharmacy:   [facility] [address] [place], [place] - [address] [place] [address] Phone: [phone] Fax: [phone]     Social Determinants of Health (SDOH) Interventions    Readmission Risk Interventions No flowsheet data found.

## 2020-08-19 NOTE — Care Management Obs Status (Signed)
MEDICARE OBSERVATION STATUS NOTIFICATION   Patient Details  Name: Michael Wall MRN: [id number] Date of Birth: 1961/02/16   Medicare Observation Status Notification Given:  Yes    Chapman Fitch, RN 08/19/2020, 2:52 PM

## 2020-08-19 NOTE — Progress Notes (Signed)
Initial Nutrition Assessment  DOCUMENTATION CODES:   Severe malnutrition in context of social or environmental circumstances  INTERVENTION:   Ensure Enlive po TID, each supplement provides 350 kcal and 20 grams of protein  MVI daily   NUTRITION DIAGNOSIS:   Severe Malnutrition related to social / environmental circumstances as evidenced by moderate fat depletion,severe fat depletion,moderate muscle depletion,severe muscle depletion.  GOAL:   Patient will meet greater than or equal to 90% of their needs  MONITOR:   PO intake,Supplement acceptance,Labs,Weight trends,Skin,I & O's  REASON FOR ASSESSMENT:   Malnutrition Screening Tool    ASSESSMENT:   60 y.o. male with medical history significant for mental retardation, schizophrenia, HTN, DM, CAD, GERD and ischemic colitis who is admitted with UTI and fall   Met with pt in room today. Pt reports intermittent poor appetite and oral intake at baseline. Pt reports that sometimes he will eat well and then at times, he does not feel like eating. Pt reports that he does not drink any supplements at baseline. Pt is documented to be eating 90-100% of meals in hospital. RD discussed with pt the importance of adequate nutrition needed to preserve lean muscle. Pt would like to have chocolate supplements while in hospital. RD will add supplements and MVI to help pt meet his estimated needs. Per chart, pt is down ~65lbs over the past 3-4 years. RD unsure how recently weight loss occurred as there are no recent weights documented in chart and pt is unsure.    Medications reviewed and include: aspirin, plavix, heparin, insulin, remeron, protonix, ceftriaxone    Labs reviewed: K 3.1(L), BUN 25(H), Mg 1.5(L), AST 138(H), ALT 123(H) cbgs- 149, 172 x 24 hrs AIC 6.4(H)- 1/6  NUTRITION - FOCUSED PHYSICAL EXAM:  Flowsheet Row Most Recent Value  Orbital Region Moderate depletion  Upper Arm Region Severe depletion  Thoracic and Lumbar Region Severe  depletion  Buccal Region Severe depletion  Temple Region Severe depletion  Clavicle Bone Region Severe depletion  Clavicle and Acromion Bone Region Severe depletion  Scapular Bone Region Severe depletion  Dorsal Hand Severe depletion  Patellar Region Severe depletion  Anterior Thigh Region Severe depletion  Posterior Calf Region Severe depletion  Edema (RD Assessment) None  Hair Reviewed  Eyes Reviewed  Mouth Reviewed  Skin Reviewed  Nails Reviewed     Diet Order:   Diet Order            Diet regular Room service appropriate? Yes; Fluid consistency: Thin  Diet effective now                EDUCATION NEEDS:   Education needs have been addressed  Skin:  Skin Assessment: Reviewed RN Assessment (ecchymosis)  Last BM:  pta  Height:   Ht Readings from Last 1 Encounters:  08/18/20 5' 9"  (1.753 m)    Weight:   Wt Readings from Last 1 Encounters:  08/18/20 59 kg    Ideal Body Weight:  72.7 kg  BMI:  Body mass index is 19.2 kg/m.  Estimated Nutritional Needs:   Kcal:  1800-2100kcal/day  Protein:  90-105g/day  Fluid:  >1.8L/day  Koleen Distance MS, RD, LDN Please refer to [facility] for RD and/or RD on-call/weekend/after hours pager

## 2020-08-19 NOTE — Progress Notes (Addendum)
Progress Note    Michael Wall  ATF:[id number] DOB: [birth year]  DOA: 08/18/2020 PCP: Margaretann Loveless, MD      Brief Narrative:    Medical records reviewed and are as summarized below:  Michael Wall is a 60 y.o. male is a 60 year old man with medical history significant for mental retardation, schizophrenia, nonobstructive CAD, hypertension, diabetes mellitus, ischemic colitis.  He presented to the hospital after a fall at the group home.  He said a dresser fell on him.   He was hypotensive in the emergency room.  He was found to have acute UTI and acute kidney injury.   Assessment/Plan:   Principal Problem:   AKI (acute kidney injury) (HCC) Active Problems:   Coronary artery disease   HBP (high blood pressure)   Hx of coronary artery disease   UTI (urinary tract infection)   Hypotension   Protein-calorie malnutrition, severe   Acute UTI   Nutrition Problem: Severe Malnutrition Etiology: social / environmental circumstances  Signs/Symptoms: moderate fat depletion,severe fat depletion,moderate muscle depletion,severe muscle depletion   Body mass index is 19.2 kg/m.    AKI: Improved.  Monitor creatinine off of IV fluids.  Acute UTI: Continue IV Rocephin.  Urine culture is growing gram-negative rods.  Follow-up urine culture and sensitivity report.  Hypokalemia: Replete potassium  Elevated liver enzymes: Liver enzymes are trending up.  Etiology is not clear.  It could be due to recent hypotension.  Continue to monitor.  S/p fall sustaining bruises to left knee after a dresser fell on him.  CAD (nonobstructive CAD by cath in 2020): He is on aspirin, Plavix, metoprolol and rosuvastatin.  Hypotension with history of hypertension: Diltiazem and lisinopril have been held.  Mental retardation, schizophrenia: Continue psychotropics  Type II DM: Jardiance, Trulicity and Metformin have been held.  Humalog as needed for hyperglycemia.  Iron deficiency  anemia: Start ferrous sulfate.  Outpatient follow-up for colonoscopy.   Diet Order            Diet regular Room service appropriate? Yes; Fluid consistency: Thin  Diet effective now                    Consultants:  None  Procedures:  None    Medications:   . aspirin EC  81 mg Oral Daily  . benztropine  2 mg Oral BID  . clopidogrel  75 mg Oral Q breakfast  . feeding supplement  237 mL Oral TID BM  . [START ON 08/20/2020] ferrous sulfate  325 mg Oral QODAY  . haloperidol  10 mg Oral TID  . heparin  5,000 Units Subcutaneous Q8H  . insulin aspart  0-5 Units Subcutaneous QHS  . insulin aspart  0-9 Units Subcutaneous TID WC  . metoprolol tartrate  100 mg Oral BID  . mirtazapine  45 mg Oral QHS  . [START ON 08/20/2020] multivitamin with minerals  1 tablet Oral Daily  . pantoprazole  40 mg Oral Daily  . QUEtiapine  150 mg Oral QHS  . rosuvastatin  40 mg Oral QPM   Continuous Infusions: . cefTRIAXone (ROCEPHIN)  IV 1 g (08/19/20 1047)     Anti-infectives (From admission, onward)   Start     Dose/Rate Route Frequency Ordered Stop   08/19/20 0800  cefTRIAXone (ROCEPHIN) 1 g in sodium chloride 0.9 % 100 mL IVPB        1 g 200 mL/hr over 30 Minutes Intravenous Every 24 hours 08/18/20 0616  08/18/20 0500  cefTRIAXone (ROCEPHIN) 1 g in sodium chloride 0.9 % 100 mL IVPB        1 g 200 mL/hr over 30 Minutes Intravenous  Once 08/18/20 0449 08/18/20 0557             Family Communication/Anticipated D/C date and plan/Code Status   DVT prophylaxis: heparin injection 5,000 Units Start: 08/18/20 0830     Code Status: Full Code  Family Communication: None Disposition Plan:    Status is: Inpatient  Remains inpatient appropriate because:IV treatments appropriate due to intensity of illness or inability to take PO   Dispo: The patient is from: Group home              Anticipated d/c is to: Group home              Anticipated d/c date is: 1 day               Patient currently is not medically stable to d/c.           Subjective:   Interval events noted.  No vomiting, abdominal pain, diarrhea, shortness of breath or chest pain.  He confirmed that he fell at the group home.  Objective:    Vitals:   08/18/20 2006 08/19/20 0414 08/19/20 1203 08/19/20 1638  BP: 139/64 139/69 115/67 129/71  Pulse: (!) 102 94 93 87  Resp: 20 20 16 16   Temp: 98.2 F (36.8 C) 98.3 F (36.8 C) (!) 97.4 F (36.3 C) 97.6 F (36.4 C)  TempSrc: Oral Oral Oral Oral  SpO2: 100% 100% 100% 100%  Weight:      Height:       No data found.   Intake/Output Summary (Last 24 hours) at 08/19/2020 1704 Last data filed at 08/19/2020 1354 Gross per 24 hour  Intake 840 ml  Output 651 ml  Net 189 ml   Filed Weights   08/18/20 0050  Weight: 59 kg    Exam:  GEN: NAD SKIN: Bruises on the left knee EYES: No pallor or icterus ENT: MMM CV: RRR PULM: CTA B ABD: soft, ND, NT, +BS CNS: AAO x 3, non focal EXT: No edema or tenderness   Data Reviewed:   I have personally reviewed following labs and imaging studies:  Labs: Labs show the following:   Basic Metabolic Panel: Recent Labs  Lab 08/18/20 0207 08/18/20 0834 08/19/20 0424  NA 137  --  143  K 4.1  --  3.1*  CL 107  --  109  CO2 20*  --  24  GLUCOSE 140*  --  100*  BUN 55*  --  25*  CREATININE 1.97* 1.54* 1.12  CALCIUM 8.4*  --  8.5*  MG  --   --  1.5*   GFR Estimated Creatinine Clearance: 58.5 mL/min (by C-G formula based on SCr of 1.12 mg/dL). Liver Function Tests: Recent Labs  Lab 08/18/20 0207 08/19/20 0424  AST 41 138*  ALT 48* 123*  ALKPHOS 61 68  BILITOT 0.5 0.7  PROT 5.6* 6.3*  ALBUMIN 2.8* 2.9*   Recent Labs  Lab 08/18/20 0834  LIPASE 20   No results for input(s): AMMONIA in the last 168 hours. Coagulation profile Recent Labs  Lab 08/19/20 0424  INR 1.0    CBC: Recent Labs  Lab 08/18/20 0207 08/19/20 0424  WBC 11.7* 8.6  NEUTROABS 9.4*  --   HGB 10.3*  11.3*  HCT 30.6* 34.5*  MCV 83.8 85.0  PLT  209 227   Cardiac Enzymes: No results for input(s): CKTOTAL, CKMB, CKMBINDEX, TROPONINI in the last 168 hours. BNP (last 3 results) No results for input(s): PROBNP in the last 8760 hours. CBG: Recent Labs  Lab 08/18/20 2122 08/19/20 1153 08/19/20 1639  GLUCAP 149* 172* 138*   D-Dimer: No results for input(s): DDIMER in the last 72 hours. Hgb A1c: Recent Labs    08/18/20 0630  HGBA1C 6.4*   Lipid Profile: No results for input(s): CHOL, HDL, LDLCALC, TRIG, CHOLHDL, LDLDIRECT in the last 72 hours. Thyroid function studies: Recent Labs    08/18/20 0834  TSH 0.989   Anemia work up: Recent Labs    08/18/20 0834  FERRITIN 90  TIBC 305  IRON 37*   Sepsis Labs: Recent Labs  Lab 08/18/20 0207 08/18/20 0630 08/18/20 0834 08/19/20 0424  PROCALCITON  --   --   --  0.13  WBC 11.7*  --   --  8.6  LATICACIDVEN  --  1.3 1.2  --     Microbiology Recent Results (from the past 240 hour(s))  Resp Panel by RT-PCR (Flu A&B, Covid) Nasopharyngeal Swab     Status: None   Collection Time: 08/18/20  4:12 AM   Specimen: Nasopharyngeal Swab; Nasopharyngeal(NP) swabs in vial transport medium  Result Value Ref Range Status   SARS Coronavirus 2 by RT PCR NEGATIVE NEGATIVE Final    Comment: (NOTE) SARS-CoV-2 target nucleic acids are NOT DETECTED.  The SARS-CoV-2 RNA is generally detectable in upper respiratory specimens during the acute phase of infection. The lowest concentration of SARS-CoV-2 viral copies this assay can detect is 138 copies/mL. A negative result does not preclude SARS-Cov-2 infection and should not be used as the sole basis for treatment or other patient management decisions. A negative result may occur with  improper specimen collection/handling, submission of specimen other than nasopharyngeal swab, presence of viral mutation(s) within the areas targeted by this assay, and inadequate number of viral copies(<138  copies/mL). A negative result must be combined with clinical observations, patient history, and epidemiological information. The expected result is Negative.  Fact Sheet for Patients:  BloggerCourse.com  Fact Sheet for Healthcare Providers:  SeriousBroker.it  This test is no t yet approved or cleared by the [place] FDA and  has been authorized for detection and/or diagnosis of SARS-CoV-2 by FDA under an Emergency Use Authorization (EUA). This EUA will remain  in effect (meaning this test can be used) for the duration of the COVID-19 declaration under Section 564(b)(1) of the Act, 21 U.S.C.section 360bbb-3(b)(1), unless the authorization is terminated  or revoked sooner.       Influenza A by PCR NEGATIVE NEGATIVE Final   Influenza B by PCR NEGATIVE NEGATIVE Final    Comment: (NOTE) The Xpert Xpress SARS-CoV-2/FLU/RSV plus assay is intended as an aid in the diagnosis of influenza from Nasopharyngeal swab specimens and should not be used as a sole basis for treatment. Nasal washings and aspirates are unacceptable for Xpert Xpress SARS-CoV-2/FLU/RSV testing.  Fact Sheet for Patients: BloggerCourse.com  Fact Sheet for Healthcare Providers: SeriousBroker.it  This test is not yet approved or cleared by the [place] FDA and has been authorized for detection and/or diagnosis of SARS-CoV-2 by FDA under an Emergency Use Authorization (EUA). This EUA will remain in effect (meaning this test can be used) for the duration of the COVID-19 declaration under Section 564(b)(1) of the Act, 21 U.S.C. section 360bbb-3(b)(1), unless the authorization is terminated or revoked.  Performed at  [facility], [address], [place], [place] [address]   Urine Culture     Status: Abnormal (Preliminary result)   Collection Time: 08/18/20  4:12 AM   Specimen: Urine, Random  Result  Value Ref Range Status   Specimen Description   Final    URINE, RANDOM Performed at [facility], [address], [place], [place] [address]    Special Requests   Final    NONE Performed at [facility], [address], [place], [place] [address]    Culture (A)  Final    >=100,000 COLONIES/mL STAPHYLOCOCCUS CAPITIS SUSCEPTIBILITIES TO FOLLOW Performed at [facility], [address], [place], [place] [address]    Report Status PENDING  Incomplete  CULTURE, BLOOD (ROUTINE X 2) w Reflex to ID Panel     Status: None (Preliminary result)   Collection Time: 08/18/20  7:58 AM   Specimen: BLOOD  Result Value Ref Range Status   Specimen Description BLOOD BLOOD RIGHT ARM  Final   Special Requests   Final    BOTTLES DRAWN AEROBIC AND ANAEROBIC Blood Culture results may not be optimal due to an inadequate volume of blood received in culture bottles   Culture   Final    NO GROWTH < 24 HOURS Performed at [facility], [address], [place], [place] [address]    Report Status PENDING  Incomplete  CULTURE, BLOOD (ROUTINE X 2) w Reflex to ID Panel     Status: None (Preliminary result)   Collection Time: 08/18/20  7:58 AM   Specimen: BLOOD  Result Value Ref Range Status   Specimen Description BLOOD BLOOD LEFT WRIST  Final   Special Requests   Final    BOTTLES DRAWN AEROBIC AND ANAEROBIC Blood Culture results may not be optimal due to an inadequate volume of blood received in culture bottles   Culture   Final    NO GROWTH < 24 HOURS Performed at [facility], [address] [place], [place] [address]    Report Status PENDING  Incomplete    Procedures and diagnostic studies:  DG Knee 1-2 Views Left  Result Date: 08/18/2020 CLINICAL DATA:  Fall with abrasion to the left anterior knee EXAM: LEFT KNEE - 1-2 VIEW COMPARISON:  None. FINDINGS: No evidence of fracture, dislocation, or joint effusion. No evidence of arthropathy or other focal bone  abnormality. Soft tissues are unremarkable. IMPRESSION: Negative. Electronically Signed   By: Malachy Moan M.D.   On: 08/18/2020 08:55   CT Head Wo Contrast  Result Date: 08/18/2020 CLINICAL DATA:  Larey Seat, hypotension EXAM: CT HEAD WITHOUT CONTRAST TECHNIQUE: Contiguous axial images were obtained from the base of the skull through the vertex without intravenous contrast. COMPARISON:  None. FINDINGS: Brain: No acute infarct or hemorrhage. Lateral ventricles and midline structures are unremarkable. No acute extra-axial fluid collections. No mass effect. Vascular: No hyperdense vessel or unexpected calcification. Skull: Normal. Negative for fracture or focal lesion. Sinuses/Orbits: No acute finding. Other: None. IMPRESSION: 1. No acute intracranial process. Electronically Signed   By: Sharlet Salina M.D.   On: 08/18/2020 03:45   DG Chest Port 1 View  Result Date: 08/18/2020 CLINICAL DATA:  Pain following fall EXAM: PORTABLE CHEST 1 VIEW COMPARISON:  July 13, 2018 FINDINGS: There is slight left base atelectasis. Lungs otherwise are clear. Heart size and pulmonary vascularity are normal. No adenopathy. There is aortic atherosclerosis. No bone lesions. IMPRESSION: Slight left base atelectasis. Lungs elsewhere clear. Heart size  normal. Aortic Atherosclerosis (ICD10-I70.0). Electronically Signed   By: Bretta Bang III M.D.   On: 08/18/2020 07:59               LOS: 0 days   Quinita Kostelecky  [facility]   Pager on [email or web address]. If 7PM-7AM, please contact night-coverage at [email or web address]     08/19/2020, 5:04 PM

## 2020-08-20 ENCOUNTER — Inpatient Hospital Stay: Payer: Medicare Other

## 2020-08-20 DIAGNOSIS — R748 Abnormal levels of other serum enzymes: Secondary | ICD-10-CM | POA: Diagnosis present

## 2020-08-20 DIAGNOSIS — N179 Acute kidney failure, unspecified: Secondary | ICD-10-CM | POA: Diagnosis not present

## 2020-08-20 DIAGNOSIS — N390 Urinary tract infection, site not specified: Secondary | ICD-10-CM | POA: Diagnosis not present

## 2020-08-20 LAB — GLUCOSE, CAPILLARY
Glucose-Capillary: 114 mg/dL — ABNORMAL HIGH (ref 70–99)
Glucose-Capillary: 210 mg/dL — ABNORMAL HIGH (ref 70–99)
Glucose-Capillary: 164 mg/dL — ABNORMAL HIGH (ref 70–99)
Glucose-Capillary: 98 mg/dL (ref 70–99)

## 2020-08-20 LAB — CBC WITH DIFFERENTIAL/PLATELET
Abs Immature Granulocytes: 0.02 10*3/uL (ref 0.00–0.07)
Basophils Absolute: 0 10*3/uL (ref 0.0–0.1)
Basophils Relative: 1 %
Eosinophils Absolute: 0.1 10*3/uL (ref 0.0–0.5)
Eosinophils Relative: 2 %
HCT: 35.8 % — ABNORMAL LOW (ref 39.0–52.0)
Hemoglobin: 11.9 g/dL — ABNORMAL LOW (ref 13.0–17.0)
Immature Granulocytes: 1 %
Lymphocytes Relative: 32 %
Lymphs Abs: 1.4 10*3/uL (ref 0.7–4.0)
MCH: 28.2 pg (ref 26.0–34.0)
MCHC: 33.2 g/dL (ref 30.0–36.0)
MCV: 84.8 fL (ref 80.0–100.0)
Monocytes Absolute: 0.3 10*3/uL (ref 0.1–1.0)
Monocytes Relative: 8 %
Neutro Abs: 2.5 10*3/uL (ref 1.7–7.7)
Neutrophils Relative %: 56 %
Platelets: 215 10*3/uL (ref 150–400)
RBC: 4.22 MIL/uL (ref 4.22–5.81)
RDW: 12.9 % (ref 11.5–15.5)
WBC: 4.3 10*3/uL (ref 4.0–10.5)
nRBC: 0 % (ref 0.0–0.2)

## 2020-08-20 LAB — COMPREHENSIVE METABOLIC PANEL
ALT: 351 U/L — ABNORMAL HIGH (ref 0–44)
AST: 466 U/L — ABNORMAL HIGH (ref 15–41)
Albumin: 2.7 g/dL — ABNORMAL LOW (ref 3.5–5.0)
Alkaline Phosphatase: 84 U/L (ref 38–126)
Anion gap: 10 (ref 5–15)
BUN: 18 mg/dL (ref 6–20)
CO2: 21 mmol/L — ABNORMAL LOW (ref 22–32)
Calcium: 8.7 mg/dL — ABNORMAL LOW (ref 8.9–10.3)
Chloride: 110 mmol/L (ref 98–111)
Creatinine, Ser: 1.03 mg/dL (ref 0.61–1.24)
GFR, Estimated: >60 mL/min (ref >60)
Glucose, Bld: 104 mg/dL — ABNORMAL HIGH (ref 70–99)
Potassium: 3.9 mmol/L (ref 3.5–5.1)
Sodium: 141 mmol/L (ref 135–145)
Total Bilirubin: 0.6 mg/dL (ref 0.3–1.2)
Total Protein: 5.9 g/dL — ABNORMAL LOW (ref 6.5–8.1)

## 2020-08-20 LAB — URINE CULTURE: Culture: >=100000 — AB

## 2020-08-20 MED ORDER — MAGNESIUM SULFATE 2 GM/50ML IV SOLN
2.0000 g | Freq: Once | INTRAVENOUS | Status: AC
  Administered 2020-08-20: 2 g via INTRAVENOUS
  Filled 2020-08-20: qty 50

## 2020-08-20 MED ORDER — AMOXICILLIN 500 MG PO CAPS
500.0000 mg | ORAL_CAPSULE | Freq: Three times a day (TID) | ORAL | Status: DC
Start: 2020-08-20 — End: 2020-08-22
  Administered 2020-08-20 – 2020-08-22 (×6): 500 mg via ORAL
  Filled 2020-08-20 (×7): qty 1

## 2020-08-20 MED ORDER — SODIUM CHLORIDE 0.9 % IV SOLN
INTRAVENOUS | Status: DC | PRN
  Administered 2020-08-20: 250 mL via INTRAVENOUS

## 2020-08-20 NOTE — Progress Notes (Signed)
Mobility Specialist - Progress Note   08/20/20 0955  Mobility  Activity Dangled on edge of bed;Sat and stood x 3 (seated exercises, 4 R lateral steps at bedside towards [facility])  Level of Assistance Contact guard assist, steadying assist  Assistive Device Front wheel walker  Mobility Response Tolerated well  Mobility performed by Mobility specialist  $Mobility charge 1 Mobility    Pt received uncovered, utilizing urinal in bed. Pt agreed to session. Pt alert and orient x3. Not orient to time. Able to follow some simple commands w/ demonstration. Pt independent getting to EOB. Pt has good balance while sitting EOB. Pt performed seated exercises: ankle pumps x 10, kicks x 10, and marches x 10. Pt progressed to S2S x 3 utilizing a RW CGA. Standing balance good. However, noted pt very unsteady when attempting to take 1 step forward. Deferred ambulating at this time for safety. Pt able to take 4 R lateral steps along bedside towards [facility] w/ min. Assist and heavy VC for safety. Overall, pt tolerated session well. Pt left laying in bed w/ alarm set. All needs placed in reach. Nurse was notified.     Saniyya Gau Mobility Specialist  08/20/20, 10:06 AM

## 2020-08-20 NOTE — Progress Notes (Addendum)
Progress Note    Michael Wall  QPR:[id number] DOB: [birth year]  DOA: 08/18/2020 PCP: Margaretann Loveless, MD      Brief Narrative:    Medical records reviewed and are as summarized below:  Michael Wall is a 60 y.o. male is a 60 year old man with medical history significant for mental retardation, schizophrenia, nonobstructive CAD, hypertension, diabetes mellitus, ischemic colitis.  He presented to the hospital after a fall at the group home.  He said a dresser fell on him.   He was hypotensive in the emergency room.  He was found to have acute UTI and acute kidney injury.   Assessment/Plan:   Principal Problem:   AKI (acute kidney injury) (HCC) Active Problems:   Coronary artery disease   HBP (high blood pressure)   Hx of coronary artery disease   UTI (urinary tract infection)   Hypotension   Protein-calorie malnutrition, severe   Acute UTI   Elevated liver enzymes   Nutrition Problem: Severe Malnutrition Etiology: social / environmental circumstances  Signs/Symptoms: moderate fat depletion,severe fat depletion,moderate muscle depletion,severe muscle depletion   Body mass index is 19.2 kg/m.    AKI: Improved.  Monitor creatinine off of IV fluids.  Acute UTI: Urine culture showed staph capitis.  Switch IV Rocephin to oral amoxicillin.    Hypokalemia: Improved  Elevated liver enzymes: Liver enzymes are getting worse.  Liver ultrasound ordered today did not show any acute abnormality.  Acute hepatitis panel has been ordered.  Monitor liver enzymes closely.  Etiology is not clear.  It could be due to recent hypotension.  Discontinue rosuvastatin for now.  S/p fall sustaining bruises to left knee after a dresser fell on him.  He had a session with mobility specialist but ambulation was deferred because he was unsteady on his feet.  Consult PT.  CAD (nonobstructive CAD by cath in 2020): He is on aspirin, Plavix, metoprolol and rosuvastatin.  Hypotension  with history of hypertension: Diltiazem and lisinopril have been held.  Mental retardation, schizophrenia: Continue psychotropics  Type II DM: Jardiance, Trulicity and Metformin have been held.  Humalog as needed for hyperglycemia.  Iron deficiency anemia: Continue ferrous sulfate.  Outpatient follow-up for colonoscopy.   Diet Order            Diet regular Room service appropriate? Yes; Fluid consistency: Thin  Diet effective now                    Consultants:  None  Procedures:  None    Medications:   . amoxicillin  500 mg Oral Q8H  . aspirin EC  81 mg Oral Daily  . benztropine  2 mg Oral BID  . clopidogrel  75 mg Oral Q breakfast  . feeding supplement  237 mL Oral TID BM  . ferrous sulfate  325 mg Oral QODAY  . haloperidol  10 mg Oral TID  . heparin  5,000 Units Subcutaneous Q8H  . insulin aspart  0-5 Units Subcutaneous QHS  . insulin aspart  0-9 Units Subcutaneous TID WC  . metoprolol tartrate  100 mg Oral BID  . mirtazapine  45 mg Oral QHS  . multivitamin with minerals  1 tablet Oral Daily  . pantoprazole  40 mg Oral Daily  . QUEtiapine  150 mg Oral QHS   Continuous Infusions: . sodium chloride Stopped (08/20/20 1336)     Anti-infectives (From admission, onward)   Start     Dose/Rate Route Frequency Ordered Stop  08/20/20 2200  amoxicillin (AMOXIL) capsule 500 mg        500 mg Oral Every 8 hours 08/20/20 1523     08/19/20 0800  cefTRIAXone (ROCEPHIN) 1 g in sodium chloride 0.9 % 100 mL IVPB  Status:  Discontinued        1 g 200 mL/hr over 30 Minutes Intravenous Every 24 hours 08/18/20 0616 08/20/20 1523   08/18/20 0500  cefTRIAXone (ROCEPHIN) 1 g in sodium chloride 0.9 % 100 mL IVPB        1 g 200 mL/hr over 30 Minutes Intravenous  Once 08/18/20 0449 08/18/20 0557             Family Communication/Anticipated D/C date and plan/Code Status   DVT prophylaxis: heparin injection 5,000 Units Start: 08/18/20 0830     Code Status: Full  Code  Family Communication: None Disposition Plan:    Status is: Inpatient  Remains inpatient appropriate because:IV treatments appropriate due to intensity of illness or inability to take PO   Dispo: The patient is from: Group home              Anticipated d/c is to: Group home              Anticipated d/c date is: 1 day              Patient currently is not medically stable to d/c.           Subjective:   Interval events noted.  No vomiting, abdominal pain or diarrhea.  Objective:    Vitals:   08/19/20 1944 08/19/20 2258 08/20/20 0352 08/20/20 0937  BP: 133/71 102/90 (!) 120/107 (!) 105/59  Pulse: (!) 104 99 95 94  Resp: 20 20 20 18   Temp: 97.9 F (36.6 C) 98 F (36.7 C) 98.4 F (36.9 C) (!) 97.5 F (36.4 C)  TempSrc: Oral Oral Oral   SpO2: 99% 99% 100% 98%  Weight:      Height:       No data found.   Intake/Output Summary (Last 24 hours) at 08/20/2020 1548 Last data filed at 08/20/2020 1504 Gross per 24 hour  Intake 703.89 ml  Output 375 ml  Net 328.89 ml   Filed Weights   08/18/20 0050  Weight: 59 kg    Exam:  GEN: NAD SKIN: Bruise on the left knee EYES: EOMI  ENT: MMM CV: RRR PULM: CTA B ABD: soft, ND, NT, +BS CNS: AAO x 3, non focal EXT: No edema or tenderness       Data Reviewed:   I have personally reviewed following labs and imaging studies:  Labs: Labs show the following:   Basic Metabolic Panel: Recent Labs  Lab 08/18/20 0207 08/18/20 0834 08/19/20 0424 08/20/20 0508  NA 137  --  143 141  K 4.1  --  3.1* 3.9  CL 107  --  109 110  CO2 20*  --  24 21*  GLUCOSE 140*  --  100* 104*  BUN 55*  --  25* 18  CREATININE 1.97* 1.54* 1.12 1.03  CALCIUM 8.4*  --  8.5* 8.7*  MG  --   --  1.5*  --    GFR Estimated Creatinine Clearance: 63.6 mL/min (by C-G formula based on SCr of 1.03 mg/dL). Liver Function Tests: Recent Labs  Lab 08/18/20 0207 08/19/20 0424 08/20/20 0508  AST 41 138* 466*  ALT 48* 123* 351*   ALKPHOS 61 68 84  BILITOT 0.5 0.7 0.6  PROT 5.6* 6.3* 5.9*  ALBUMIN 2.8* 2.9* 2.7*   Recent Labs  Lab 08/18/20 0834  LIPASE 20   No results for input(s): AMMONIA in the last 168 hours. Coagulation profile Recent Labs  Lab 08/19/20 0424  INR 1.0    CBC: Recent Labs  Lab 08/18/20 0207 08/19/20 0424 08/20/20 0508  WBC 11.7* 8.6 4.3  NEUTROABS 9.4*  --  2.5  HGB 10.3* 11.3* 11.9*  HCT 30.6* 34.5* 35.8*  MCV 83.8 85.0 84.8  PLT 209 227 215   Cardiac Enzymes: No results for input(s): CKTOTAL, CKMB, CKMBINDEX, TROPONINI in the last 168 hours. BNP (last 3 results) No results for input(s): PROBNP in the last 8760 hours. CBG: Recent Labs  Lab 08/19/20 1153 08/19/20 1639 08/19/20 2143 08/20/20 0735 08/20/20 1200  GLUCAP 172* 138* 105* 114* 210*   D-Dimer: No results for input(s): DDIMER in the last 72 hours. Hgb A1c: Recent Labs    08/18/20 0630  HGBA1C 6.4*   Lipid Profile: No results for input(s): CHOL, HDL, LDLCALC, TRIG, CHOLHDL, LDLDIRECT in the last 72 hours. Thyroid function studies: Recent Labs    08/18/20 0834  TSH 0.989   Anemia work up: Recent Labs    08/18/20 0834  FERRITIN 90  TIBC 305  IRON 37*   Sepsis Labs: Recent Labs  Lab 08/18/20 0207 08/18/20 0630 08/18/20 0834 08/19/20 0424 08/20/20 0508  PROCALCITON  --   --   --  0.13  --   WBC 11.7*  --   --  8.6 4.3  LATICACIDVEN  --  1.3 1.2  --   --     Microbiology Recent Results (from the past 240 hour(s))  Resp Panel by RT-PCR (Flu A&B, Covid) Nasopharyngeal Swab     Status: None   Collection Time: 08/18/20  4:12 AM   Specimen: Nasopharyngeal Swab; Nasopharyngeal(NP) swabs in vial transport medium  Result Value Ref Range Status   SARS Coronavirus 2 by RT PCR NEGATIVE NEGATIVE Final    Comment: (NOTE) SARS-CoV-2 target nucleic acids are NOT DETECTED.  The SARS-CoV-2 RNA is generally detectable in upper respiratory specimens during the acute phase of infection. The  lowest concentration of SARS-CoV-2 viral copies this assay can detect is 138 copies/mL. A negative result does not preclude SARS-Cov-2 infection and should not be used as the sole basis for treatment or other patient management decisions. A negative result may occur with  improper specimen collection/handling, submission of specimen other than nasopharyngeal swab, presence of viral mutation(s) within the areas targeted by this assay, and inadequate number of viral copies(<138 copies/mL). A negative result must be combined with clinical observations, patient history, and epidemiological information. The expected result is Negative.  Fact Sheet for Patients:  BloggerCourse.com  Fact Sheet for Healthcare Providers:  SeriousBroker.it  This test is no t yet approved or cleared by the [place] FDA and  has been authorized for detection and/or diagnosis of SARS-CoV-2 by FDA under an Emergency Use Authorization (EUA). This EUA will remain  in effect (meaning this test can be used) for the duration of the COVID-19 declaration under Section 564(b)(1) of the Act, 21 U.S.C.section 360bbb-3(b)(1), unless the authorization is terminated  or revoked sooner.       Influenza A by PCR NEGATIVE NEGATIVE Final   Influenza B by PCR NEGATIVE NEGATIVE Final    Comment: (NOTE) The Xpert Xpress SARS-CoV-2/FLU/RSV plus assay is intended as an aid in the diagnosis of influenza from Nasopharyngeal swab specimens and should not be  used as a sole basis for treatment. Nasal washings and aspirates are unacceptable for Xpert Xpress SARS-CoV-2/FLU/RSV testing.  Fact Sheet for Patients: BloggerCourse.com  Fact Sheet for Healthcare Providers: SeriousBroker.it  This test is not yet approved or cleared by the [place] FDA and has been authorized for detection and/or diagnosis of SARS-CoV-2 by FDA under  an Emergency Use Authorization (EUA). This EUA will remain in effect (meaning this test can be used) for the duration of the COVID-19 declaration under Section 564(b)(1) of the Act, 21 U.S.C. section 360bbb-3(b)(1), unless the authorization is terminated or revoked.  Performed at [facility], [address], [place], [place] [address]   Urine Culture     Status: Abnormal   Collection Time: 08/18/20  4:12 AM   Specimen: Urine, Random  Result Value Ref Range Status   Specimen Description   Final    URINE, RANDOM Performed at [facility], [address] [place], [place] [address]    Special Requests   Final    NONE Performed at [facility], [address] [place], [place] [address]    Culture >=100,000 COLONIES/mL STAPHYLOCOCCUS CAPITIS (A)  Final   Report Status 08/20/2020 FINAL  Final   Organism ID, Bacteria STAPHYLOCOCCUS CAPITIS (A)  Final      Susceptibility   Staphylococcus capitis - MIC*    CIPROFLOXACIN <=0.5 SENSITIVE Sensitive     GENTAMICIN <=0.5 SENSITIVE Sensitive     NITROFURANTOIN <=16 SENSITIVE Sensitive     OXACILLIN <=0.25 SENSITIVE Sensitive     TETRACYCLINE <=1 SENSITIVE Sensitive     VANCOMYCIN 2 SENSITIVE Sensitive     TRIMETH/SULFA <=10 SENSITIVE Sensitive     CLINDAMYCIN <=0.25 SENSITIVE Sensitive     RIFAMPIN <=0.5 SENSITIVE Sensitive     Inducible Clindamycin NEGATIVE Sensitive     * >=100,000 COLONIES/mL STAPHYLOCOCCUS CAPITIS  CULTURE, BLOOD (ROUTINE X 2) w Reflex to ID Panel     Status: None (Preliminary result)   Collection Time: 08/18/20  7:58 AM   Specimen: BLOOD  Result Value Ref Range Status   Specimen Description BLOOD BLOOD RIGHT ARM  Final   Special Requests   Final    BOTTLES DRAWN AEROBIC AND ANAEROBIC Blood Culture results may not be optimal due to an inadequate volume of blood received in culture bottles   Culture   Final    NO GROWTH 2 DAYS Performed at [facility], [address],  [place], [place] [address]    Report Status PENDING  Incomplete  CULTURE, BLOOD (ROUTINE X 2) w Reflex to ID Panel     Status: None (Preliminary result)   Collection Time: 08/18/20  7:58 AM   Specimen: BLOOD  Result Value Ref Range Status   Specimen Description BLOOD BLOOD LEFT WRIST  Final   Special Requests   Final    BOTTLES DRAWN AEROBIC AND ANAEROBIC Blood Culture results may not be optimal due to an inadequate volume of blood received in culture bottles   Culture   Final    NO GROWTH 2 DAYS Performed at [facility], [address] [place], [place] [address]    Report Status PENDING  Incomplete    Procedures and diagnostic studies:  US Abdomen Limited RUQ (LIVER/GB)  Result Date: 08/20/2020 CLINICAL DATA:  Elevated liver enzymes EXAM: ULTRASOUND ABDOMEN LIMITED RIGHT UPPER QUADRANT COMPARISON:  None. FINDINGS: Gallbladder: No gallstones or wall thickening visualized. No sonographic Murphy sign noted by sonographer. Common bile duct: Diameter: 2.2 mm  Liver: No focal lesion identified. Within normal limits in parenchymal echogenicity. Portal vein is patent on color Doppler imaging with normal direction of blood flow towards the liver. Other: None. IMPRESSION: No significant abnormalities. Electronically Signed   By: Gerome Samavid  Williams III M.D   On: 08/20/2020 12:17               LOS: 1 day   Alyona Romack  [facility]   Pager on [email or web address]. If 7PM-7AM, please contact night-coverage at [email or web address]     08/20/2020, 3:48 PM

## 2020-08-21 DIAGNOSIS — N390 Urinary tract infection, site not specified: Secondary | ICD-10-CM | POA: Diagnosis not present

## 2020-08-21 DIAGNOSIS — N179 Acute kidney failure, unspecified: Secondary | ICD-10-CM | POA: Diagnosis not present

## 2020-08-21 DIAGNOSIS — I9589 Other hypotension: Secondary | ICD-10-CM | POA: Diagnosis not present

## 2020-08-21 DIAGNOSIS — R748 Abnormal levels of other serum enzymes: Secondary | ICD-10-CM | POA: Diagnosis not present

## 2020-08-21 LAB — GLUCOSE, CAPILLARY
Glucose-Capillary: 105 mg/dL — ABNORMAL HIGH (ref 70–99)
Glucose-Capillary: 30 mg/dL — CL (ref 70–99)
Glucose-Capillary: 270 mg/dL — ABNORMAL HIGH (ref 70–99)
Glucose-Capillary: 124 mg/dL — ABNORMAL HIGH (ref 70–99)
Glucose-Capillary: 198 mg/dL — ABNORMAL HIGH (ref 70–99)

## 2020-08-21 LAB — HEPATITIS PANEL, ACUTE
HCV Ab: NONREACTIVE
Hep A IgM: NONREACTIVE
Hep B C IgM: NONREACTIVE
Hepatitis B Surface Ag: NONREACTIVE

## 2020-08-21 LAB — COMPREHENSIVE METABOLIC PANEL
ALT: 309 U/L — ABNORMAL HIGH (ref 0–44)
AST: 236 U/L — ABNORMAL HIGH (ref 15–41)
Albumin: 2.6 g/dL — ABNORMAL LOW (ref 3.5–5.0)
Alkaline Phosphatase: 77 U/L (ref 38–126)
Anion gap: 8 (ref 5–15)
BUN: 18 mg/dL (ref 6–20)
CO2: 23 mmol/L (ref 22–32)
Calcium: 8.7 mg/dL — ABNORMAL LOW (ref 8.9–10.3)
Chloride: 107 mmol/L (ref 98–111)
Creatinine, Ser: 1.09 mg/dL (ref 0.61–1.24)
GFR, Estimated: >60 mL/min (ref >60)
Glucose, Bld: 126 mg/dL — ABNORMAL HIGH (ref 70–99)
Potassium: 3.5 mmol/L (ref 3.5–5.1)
Sodium: 138 mmol/L (ref 135–145)
Total Bilirubin: 0.4 mg/dL (ref 0.3–1.2)
Total Protein: 5.5 g/dL — ABNORMAL LOW (ref 6.5–8.1)

## 2020-08-21 MED ORDER — SODIUM CHLORIDE 0.9 % IV BOLUS
1000.0000 mL | Freq: Once | INTRAVENOUS | Status: AC
  Administered 2020-08-21: 1000 mL via INTRAVENOUS

## 2020-08-21 NOTE — Progress Notes (Signed)
Progress Note    Michael Wall  ELF:[id number] DOB: 1961-03-05  DOA: 08/18/2020 PCP: Margaretann Loveless, MD      Brief Narrative:    Medical records reviewed and are as summarized below:  Michael Wall is a 60 y.o. male is a 60 year old man with medical history significant for mental retardation, schizophrenia, nonobstructive CAD, hypertension, diabetes mellitus, ischemic colitis.  He presented to the hospital after a fall at the group home.  He said a dresser fell on him.   He was hypotensive in the emergency room.  He was found to have acute UTI and acute kidney injury.   Assessment/Plan:   Principal Problem:   AKI (acute kidney injury) (HCC) Active Problems:   Coronary artery disease   HBP (high blood pressure)   Hx of coronary artery disease   UTI (urinary tract infection)   Hypotension   Protein-calorie malnutrition, severe   Acute UTI   Elevated liver enzymes   Nutrition Problem: Severe Malnutrition Etiology: social / environmental circumstances  Signs/Symptoms: moderate fat depletion,severe fat depletion,moderate muscle depletion,severe muscle depletion   Body mass index is 19.2 kg/m.    Hypotension with history of hypertension, orthostatic hypotension: BP was as low as 85/54 overnight.  Later in the morning, BP was 98/58 laying down and it dropped further to 68/51 with standing.  Diltiazem and lisinopril have been on hold since admission.  Discontinue metoprolol because of persistent hypotension.  Give 1 L of normal saline for hypotension.  Monitor BP closely overnight.  AKI: Improved.   Acute UTI: Urine culture showed staph capitis.  Continue amoxicillin.  Hypokalemia: Improved  Elevated liver enzymes: Liver enzymes are trending down.  Liver ultrasound did not show any acute abnormality.  Acute hepatitis panel is pending.  It could be due to recent hypotension.  Rosuvastatin has been held.  S/p fall sustaining bruises to left knee after a  dresser fell on him.  PT recommended home health PT.  CAD (nonobstructive CAD by cath in 2020): He is on aspirin, Plavix, metoprolol and rosuvastatin.  Hypotension with history of hypertension: Diltiazem and lisinopril have been held.  Mental retardation, schizophrenia: Continue psychotropics  Type II DM: Jardiance, Trulicity and Metformin have been held.  Humalog as needed for hyperglycemia.  Episode of hypoglycemia (glucose 30) was probably an error.  Continue to monitor glucose.  Iron deficiency anemia: Continue ferrous sulfate.  Outpatient follow-up for colonoscopy.   Diet Order            Diet regular Room service appropriate? Yes; Fluid consistency: Thin  Diet effective now                    Consultants:  None  Procedures:  None    Medications:   . amoxicillin  500 mg Oral Q8H  . aspirin EC  81 mg Oral Daily  . benztropine  2 mg Oral BID  . clopidogrel  75 mg Oral Q breakfast  . feeding supplement  237 mL Oral TID BM  . ferrous sulfate  325 mg Oral QODAY  . haloperidol  10 mg Oral TID  . heparin  5,000 Units Subcutaneous Q8H  . insulin aspart  0-5 Units Subcutaneous QHS  . insulin aspart  0-9 Units Subcutaneous TID WC  . mirtazapine  45 mg Oral QHS  . multivitamin with minerals  1 tablet Oral Daily  . pantoprazole  40 mg Oral Daily  . QUEtiapine  150 mg Oral QHS  Continuous Infusions: . sodium chloride Stopped (08/20/20 1336)  . sodium chloride       Anti-infectives (From admission, onward)   Start     Dose/Rate Route Frequency Ordered Stop   08/20/20 2200  amoxicillin (AMOXIL) capsule 500 mg        500 mg Oral Every 8 hours 08/20/20 1523     08/19/20 0800  cefTRIAXone (ROCEPHIN) 1 g in sodium chloride 0.9 % 100 mL IVPB  Status:  Discontinued        1 g 200 mL/hr over 30 Minutes Intravenous Every 24 hours 08/18/20 0616 08/20/20 1523   08/18/20 0500  cefTRIAXone (ROCEPHIN) 1 g in sodium chloride 0.9 % 100 mL IVPB        1 g 200 mL/hr over 30  Minutes Intravenous  Once 08/18/20 0449 08/18/20 0557             Family Communication/Anticipated D/C date and plan/Code Status   DVT prophylaxis: heparin injection 5,000 Units Start: 08/18/20 0830     Code Status: Full Code  Family Communication: None Disposition Plan:    Status is: Inpatient  Remains inpatient appropriate because:IV treatments appropriate due to intensity of illness or inability to take PO   Dispo: The patient is from: Group home              Anticipated d/c is to: Group home              Anticipated d/c date is: 1 day              Patient currently is not medically stable to d/c.           Subjective:   Interval events noted.  Glucose Accu-Chek this morning was reported as 30.  However, it appears that her glucometer was faulty.  There were no symptoms or signs of hypoglycemia.  Repeat glucose was 150.  Blood pressure has been running low.  Objective:    Vitals:   08/21/20 0457 08/21/20 0800 08/21/20 1044 08/21/20 1137  BP: (!) 97/55 95/60 (!) 98/58 (!) 88/50  Pulse: 89 91 92 98  Resp:  14    Temp:  97.6 F (36.4 C)  97.6 F (36.4 C)  TempSrc:  Oral  Oral  SpO2:  99% 99% 98%  Weight:      Height:       No data found.   Intake/Output Summary (Last 24 hours) at 08/21/2020 1152 Last data filed at 08/21/2020 1027 Gross per 24 hour  Intake 963.89 ml  Output 1600 ml  Net -636.11 ml   Filed Weights   08/18/20 0050  Weight: 59 kg    Exam:  GEN: NAD SKIN: Warm and dry.  Bruise on the left knee EYES: No pallor or icterus ENT: MMM CV: RRR PULM: CTA B ABD: soft, ND, NT, +BS CNS: AAO x 3, non focal EXT: No edema or tenderness       Data Reviewed:   I have personally reviewed following labs and imaging studies:  Labs: Labs show the following:   Basic Metabolic Panel: Recent Labs  Lab 08/18/20 0207 08/18/20 0834 08/19/20 0424 08/20/20 0508 08/21/20 0412  NA 137  --  143 141 138  K 4.1  --  3.1* 3.9 3.5  CL  107  --  109 110 107  CO2 20*  --  24 21* 23  GLUCOSE 140*  --  100* 104* 126*  BUN 55*  --  25* 18 18  CREATININE 1.97* 1.54* 1.12 1.03 1.09  CALCIUM 8.4*  --  8.5* 8.7* 8.7*  MG  --   --  1.5*  --   --    GFR Estimated Creatinine Clearance: 60.1 mL/min (by C-G formula based on SCr of 1.09 mg/dL). Liver Function Tests: Recent Labs  Lab 08/18/20 0207 08/19/20 0424 08/20/20 0508 08/21/20 0412  AST 41 138* 466* 236*  ALT 48* 123* 351* 309*  ALKPHOS 61 68 84 77  BILITOT 0.5 0.7 0.6 0.4  PROT 5.6* 6.3* 5.9* 5.5*  ALBUMIN 2.8* 2.9* 2.7* 2.6*   Recent Labs  Lab 08/18/20 0834  LIPASE 20   No results for input(s): AMMONIA in the last 168 hours. Coagulation profile Recent Labs  Lab 08/19/20 0424  INR 1.0    CBC: Recent Labs  Lab 08/18/20 0207 08/19/20 0424 08/20/20 0508  WBC 11.7* 8.6 4.3  NEUTROABS 9.4*  --  2.5  HGB 10.3* 11.3* 11.9*  HCT 30.6* 34.5* 35.8*  MCV 83.8 85.0 84.8  PLT 209 227 215   Cardiac Enzymes: No results for input(s): CKTOTAL, CKMB, CKMBINDEX, TROPONINI in the last 168 hours. BNP (last 3 results) No results for input(s): PROBNP in the last 8760 hours. CBG: Recent Labs  Lab 08/20/20 1650 08/20/20 2008 08/21/20 0744 08/21/20 0750 08/21/20 1135  GLUCAP 164* 98 30* 105* 270*   D-Dimer: No results for input(s): DDIMER in the last 72 hours. Hgb A1c: No results for input(s): HGBA1C in the last 72 hours. Lipid Profile: No results for input(s): CHOL, HDL, LDLCALC, TRIG, CHOLHDL, LDLDIRECT in the last 72 hours. Thyroid function studies: No results for input(s): TSH, T4TOTAL, T3FREE, THYROIDAB in the last 72 hours.  Invalid input(s): FREET3 Anemia work up: No results for input(s): VITAMINB12, FOLATE, FERRITIN, TIBC, IRON, RETICCTPCT in the last 72 hours. Sepsis Labs: Recent Labs  Lab 08/18/20 0207 08/18/20 0630 08/18/20 0834 08/19/20 0424 08/20/20 0508  PROCALCITON  --   --   --  0.13  --   WBC 11.7*  --   --  8.6 4.3  LATICACIDVEN   --  1.3 1.2  --   --     Microbiology Recent Results (from the past 240 hour(s))  Resp Panel by RT-PCR (Flu A&B, Covid) Nasopharyngeal Swab     Status: None   Collection Time: 08/18/20  4:12 AM   Specimen: Nasopharyngeal Swab; Nasopharyngeal(NP) swabs in vial transport medium  Result Value Ref Range Status   SARS Coronavirus 2 by RT PCR NEGATIVE NEGATIVE Final    Comment: (NOTE) SARS-CoV-2 target nucleic acids are NOT DETECTED.  The SARS-CoV-2 RNA is generally detectable in upper respiratory specimens during the acute phase of infection. The lowest concentration of SARS-CoV-2 viral copies this assay can detect is 138 copies/mL. A negative result does not preclude SARS-Cov-2 infection and should not be used as the sole basis for treatment or other patient management decisions. A negative result may occur with  improper specimen collection/handling, submission of specimen other than nasopharyngeal swab, presence of viral mutation(s) within the areas targeted by this assay, and inadequate number of viral copies(<138 copies/mL). A negative result must be combined with clinical observations, patient history, and epidemiological information. The expected result is Negative.  Fact Sheet for Patients:  BloggerCourse.com  Fact Sheet for Healthcare Providers:  SeriousBroker.it  This test is no t yet approved or cleared by the [place] FDA and  has been authorized for detection and/or diagnosis of SARS-CoV-2 by FDA under an Emergency Use Authorization (EUA).  This EUA will remain  in effect (meaning this test can be used) for the duration of the COVID-19 declaration under Section 564(b)(1) of the Act, 21 U.S.C.section 360bbb-3(b)(1), unless the authorization is terminated  or revoked sooner.       Influenza A by PCR NEGATIVE NEGATIVE Final   Influenza B by PCR NEGATIVE NEGATIVE Final    Comment: (NOTE) The Xpert Xpress  SARS-CoV-2/FLU/RSV plus assay is intended as an aid in the diagnosis of influenza from Nasopharyngeal swab specimens and should not be used as a sole basis for treatment. Nasal washings and aspirates are unacceptable for Xpert Xpress SARS-CoV-2/FLU/RSV testing.  Fact Sheet for Patients: BloggerCourse.com  Fact Sheet for Healthcare Providers: SeriousBroker.it  This test is not yet approved or cleared by the [place] FDA and has been authorized for detection and/or diagnosis of SARS-CoV-2 by FDA under an Emergency Use Authorization (EUA). This EUA will remain in effect (meaning this test can be used) for the duration of the COVID-19 declaration under Section 564(b)(1) of the Act, 21 U.S.C. section 360bbb-3(b)(1), unless the authorization is terminated or revoked.  Performed at [facility], [address] [place], [place] [address]   Urine Culture     Status: Abnormal   Collection Time: 08/18/20  4:12 AM   Specimen: Urine, Random  Result Value Ref Range Status   Specimen Description   Final    URINE, RANDOM Performed at [facility], [address] [place], [place] [address]    Special Requests   Final    NONE Performed at [facility], [address] [place], [place] [address]    Culture >=100,000 COLONIES/mL STAPHYLOCOCCUS CAPITIS (A)  Final   Report Status 08/20/2020 FINAL  Final   Organism ID, Bacteria STAPHYLOCOCCUS CAPITIS (A)  Final      Susceptibility   Staphylococcus capitis - MIC*    CIPROFLOXACIN <=0.5 SENSITIVE Sensitive     GENTAMICIN <=0.5 SENSITIVE Sensitive     NITROFURANTOIN <=16 SENSITIVE Sensitive     OXACILLIN <=0.25 SENSITIVE Sensitive     TETRACYCLINE <=1 SENSITIVE Sensitive     VANCOMYCIN 2 SENSITIVE Sensitive     TRIMETH/SULFA <=10 SENSITIVE Sensitive     CLINDAMYCIN <=0.25 SENSITIVE Sensitive     RIFAMPIN <=0.5 SENSITIVE Sensitive     Inducible Clindamycin  NEGATIVE Sensitive     * >=100,000 COLONIES/mL STAPHYLOCOCCUS CAPITIS  CULTURE, BLOOD (ROUTINE X 2) w Reflex to ID Panel     Status: None (Preliminary result)   Collection Time: 08/18/20  7:58 AM   Specimen: BLOOD  Result Value Ref Range Status   Specimen Description BLOOD BLOOD RIGHT ARM  Final   Special Requests   Final    BOTTLES DRAWN AEROBIC AND ANAEROBIC Blood Culture results may not be optimal due to an inadequate volume of blood received in culture bottles   Culture   Final    NO GROWTH 3 DAYS Performed at [facility], [address] [place], [place] [address]    Report Status PENDING  Incomplete  CULTURE, BLOOD (ROUTINE X 2) w Reflex to ID Panel     Status: None (Preliminary result)   Collection Time: 08/18/20  7:58 AM   Specimen: BLOOD  Result Value Ref Range Status   Specimen Description BLOOD BLOOD LEFT WRIST  Final   Special Requests   Final    BOTTLES DRAWN AEROBIC AND ANAEROBIC Blood Culture results may not be optimal due to an inadequate volume of blood received in  culture bottles   Culture   Final    NO GROWTH 3 DAYS Performed at [facility], [address] [place], [place] [address]    Report Status PENDING  Incomplete    Procedures and diagnostic studies:  US Abdomen Limited RUQ (LIVER/GB)  Result Date: 08/20/2020 CLINICAL DATA:  Elevated liver enzymes EXAM: ULTRASOUND ABDOMEN LIMITED RIGHT UPPER QUADRANT COMPARISON:  None. FINDINGS: Gallbladder: No gallstones or wall thickening visualized. No sonographic Murphy sign noted by sonographer. Common bile duct: Diameter: 2.2 mm Liver: No focal lesion identified. Within normal limits in parenchymal echogenicity. Portal vein is patent on color Doppler imaging with normal direction of blood flow towards the liver. Other: None. IMPRESSION: No significant abnormalities. Electronically Signed   By: Gerome Sam III M.D   On: 08/20/2020 12:17               LOS: 2 days   Derrien Anschutz  [facility]   Pager on [email or web address]. If 7PM-7AM, please contact night-coverage at [email or web address]     08/21/2020, 11:52 AM

## 2020-08-21 NOTE — Progress Notes (Signed)
Patient is hypotensive bp 85/54, HR-90, bp rechecked 87/55 21map-64), HR-89. Bebe Shaggy NP informed with no orders.

## 2020-08-21 NOTE — Plan of Care (Signed)
Progress to PLOF

## 2020-08-21 NOTE — Progress Notes (Signed)
MD notified of consistently low BPs throughout day, orthostatic BPs by PT in AM- Bolus was delivered per order, marked improvement in orthostatic vital signs (see VS flowsheet).

## 2020-08-21 NOTE — Progress Notes (Signed)
0744 CBG was 30 per glucose meter- patient alert and oriented, skin warm and dry, no signs hypoglycemia. RN rechecks BG at 0750 and 105- determines previous reading was machine error.  Advised CNA to QC the meter she used.

## 2020-08-21 NOTE — Evaluation (Signed)
Physical Therapy Evaluation Patient Details Name: Michael Wall MRN: [id number] DOB: 08-08-1961 Today's Date: 08/21/2020   History of Present Illness  60 y/o intellectual disability, schizophrenia, nonobstructive CAD, HTN, DM, ischemic colitis. Presented to ED after a fall at the group on 08/18/20. Pt reports his dresser fell on him. Admitted to hospital for acute UTI and acute kidney injury.  Clinical Impression  Pt received in his bed, alert and agreeable to PT interventions this morning.  Checked BP in the supine position measuring 98/58, then again after completing sit<>stand transfer measuring 68/51, BP returned to 100/54 following return to supine. Patient verbalized he was not dizzy or lightheaded.  Completed seated marching x10 each leg, LAQ x 10 each leg, bridges x 10 and SLR BIL x10 with VCs for pace and form.  Overall patient tolerated interventions well today with minimal increase in SOB and fatigue. Unable to ambulate at this time due to hypotension with standing position. Will continue to assess BP for appropriate ambulation.     Follow Up Recommendations Home health PT    Equipment Recommendations  Standard walker    Recommendations for Other Services       Precautions / Restrictions Precautions Precautions: Fall Restrictions Weight Bearing Restrictions: No      Mobility  Bed Mobility Overal bed mobility: Needs Assistance Bed Mobility: Sit to Supine;Supine to Sit Rolling: Min guard   Supine to sit: Min guard Sit to supine: Min guard   General bed mobility comments: Pt requiring CGA for bed mobility and VCs for appropriate hand placement for safety    Transfers Overall transfer level: Needs assistance Equipment used: Standard walker Transfers: Sit to/from Stand Sit to Stand: Min assist         General transfer comment: Pt required VCs for appropriate hand placement on the RW, BP dropped in the standing position to 68/51, pt verbalized he did not feel  dizzy or light headed. Returned pt to supine position BP: 100/54.  Ambulation/Gait                     Balance Overall balance assessment: Needs assistance Sitting-balance support: Single extremity supported;Feet supported       Standing balance support: Bilateral upper extremity supported   Standing balance comment: Pt with decreased BP in the standing position rquired min A for safety                             Pertinent Vitals/Pain Pain Assessment: No/denies pain    Home Living Family/patient expects to be discharged to:: Group home                      Prior Function Level of Independence: Independent         Comments: Pt reports he was walking within his home with no AD     Hand Dominance   Dominant Hand: Right    Extremity/Trunk Assessment        Lower Extremity Assessment Lower Extremity Assessment: Generalized weakness    Cervical / Trunk Assessment Cervical / Trunk Assessment: Kyphotic  Communication   Communication: No difficulties  Cognition Arousal/Alertness: Awake/alert Behavior During Therapy: WFL for tasks assessed/performed Overall Cognitive Status: Within Functional Limits for tasks assessed                                 General  Comments: Pt able to respond appropriately to simple commands and recount his injury to therapist      General Comments General comments (skin integrity, edema, etc.): Some scraps noticed over the L knee 0 no s/s of infection present    Exercises Total Joint Exercises Straight Leg Raises: AROM;Both;10 reps (VCs for pace and form provided) Long Arc Quad: AROM;Both;20 reps Bridges: AROM;15 reps Other Exercises Other Exercises: Seated marching BIL 10 reps each   Assessment/Plan    PT Assessment Patient needs continued PT services  PT Problem List Decreased strength;Decreased mobility;Decreased safety awareness;Decreased activity tolerance;Decreased  cognition;Decreased balance;Decreased knowledge of use of DME       PT Treatment Interventions DME instruction;Therapeutic exercise;Gait training;Balance training;Therapeutic activities;Stair training;Patient/family education    PT Goals (Current goals can be found in the Care Plan section)  Acute Rehab PT Goals Patient Stated Goal: Return home without limitations PT Goal Formulation: With patient Time For Goal Achievement: 09/04/20 Potential to Achieve Goals: Fair    Frequency Min 2X/week        AM-PAC PT "6 Clicks" Mobility  Outcome Measure Help needed turning from your back to your side while in a flat bed without using bedrails?: A Little Help needed moving from lying on your back to sitting on the side of a flat bed without using bedrails?: A Little Help needed moving to and from a bed to a chair (including a wheelchair)?: A Little Help needed standing up from a chair using your arms (e.g., wheelchair or bedside chair)?: A Little Help needed to walk in hospital room?: A Little Help needed climbing 3-5 steps with a railing? : A Little 6 Click Score: 18    End of Session Equipment Utilized During Treatment: Gait belt Activity Tolerance: Other (comment) (Unable to ambulate during session due to pt's hypotension with standing. Pt otherwise tolerated bed activities well today with no pain or dizziness.) Patient left: in bed;with bed alarm set;with call bell/phone within reach Nurse Communication: Other (comment) (Discussed BP findings) PT Visit Diagnosis: Unsteadiness on feet (R26.81);Other abnormalities of gait and mobility (R26.89);History of falling (Z91.81)    Time: 4098-1191 PT Time Calculation (min) (ACUTE ONLY): 32 min   Charges:   PT Evaluation $PT Eval Low Complexity: 1 Low PT Treatments $Therapeutic Exercise: 8-22 mins $Therapeutic Activity: 8-22 mins        Minette Headland, PT, DPT 08/21/20, 12:45 PM   [name] 08/21/2020, 11:00 AM

## 2020-08-22 ENCOUNTER — Ambulatory Visit: Payer: Medicare Other | Admitting: Podiatry

## 2020-08-22 DIAGNOSIS — N179 Acute kidney failure, unspecified: Secondary | ICD-10-CM | POA: Diagnosis not present

## 2020-08-22 DIAGNOSIS — I9589 Other hypotension: Secondary | ICD-10-CM | POA: Diagnosis not present

## 2020-08-22 DIAGNOSIS — R748 Abnormal levels of other serum enzymes: Secondary | ICD-10-CM | POA: Diagnosis not present

## 2020-08-22 DIAGNOSIS — N390 Urinary tract infection, site not specified: Secondary | ICD-10-CM | POA: Diagnosis not present

## 2020-08-22 LAB — COMPREHENSIVE METABOLIC PANEL
ALT: 218 U/L — ABNORMAL HIGH (ref 0–44)
AST: 114 U/L — ABNORMAL HIGH (ref 15–41)
Albumin: 2.5 g/dL — ABNORMAL LOW (ref 3.5–5.0)
Alkaline Phosphatase: 62 U/L (ref 38–126)
Anion gap: 10 (ref 5–15)
BUN: 22 mg/dL — ABNORMAL HIGH (ref 6–20)
CO2: 22 mmol/L (ref 22–32)
Calcium: 8.5 mg/dL — ABNORMAL LOW (ref 8.9–10.3)
Chloride: 106 mmol/L (ref 98–111)
Creatinine, Ser: 1.01 mg/dL (ref 0.61–1.24)
GFR, Estimated: >60 mL/min (ref >60)
Glucose, Bld: 144 mg/dL — ABNORMAL HIGH (ref 70–99)
Potassium: 3.5 mmol/L (ref 3.5–5.1)
Sodium: 138 mmol/L (ref 135–145)
Total Bilirubin: 0.6 mg/dL (ref 0.3–1.2)
Total Protein: 5.2 g/dL — ABNORMAL LOW (ref 6.5–8.1)

## 2020-08-22 LAB — GLUCOSE, CAPILLARY
Glucose-Capillary: 125 mg/dL — ABNORMAL HIGH (ref 70–99)
Glucose-Capillary: 325 mg/dL — ABNORMAL HIGH (ref 70–99)

## 2020-08-22 LAB — CBG MONITORING, ED: Glucose-Capillary: 94 mg/dL (ref 70–99)

## 2020-08-22 MED ORDER — ACETAMINOPHEN 325 MG PO TABS: 650.0000 mg | ORAL_TABLET | Freq: Three times a day (TID) | ORAL | Status: AC | PRN

## 2020-08-22 MED ORDER — AMOXICILLIN 500 MG PO CAPS: 500.0000 mg | ORAL_CAPSULE | Freq: Three times a day (TID) | ORAL | 0 refills | Status: AC

## 2020-08-22 MED ORDER — FERROUS SULFATE 325 (65 FE) MG PO TABS: 325.0000 mg | ORAL_TABLET | ORAL | 0 refills | Status: DC

## 2020-08-22 NOTE — Discharge Summary (Addendum)
Physician Discharge Summary  Michael Wall NPY:[id number] DOB: Jun 13, 1961 DOA: 08/18/2020  PCP: Margaretann Loveless, MD  Admit date: 08/18/2020 Discharge date: 08/22/2020  Discharge disposition: Group home   Recommendations for Outpatient Follow-Up:   Follow-up with PCP in 1 week   Discharge Diagnosis:   Principal Problem:   AKI (acute kidney injury) (HCC) Active Problems:   Coronary artery disease   Hx of coronary artery disease   UTI (urinary tract infection)   Hypotension   Protein-calorie malnutrition, severe   Acute UTI   Elevated liver enzymes    Discharge Condition: Stable.  Diet recommendation:  Diet Order            Diet - low sodium heart healthy           Diet Carb Modified           Diet regular Room service appropriate? Yes; Fluid consistency: Thin  Diet effective now                   Code Status: Full Code     Hospital Course:    Michael Wall is a 60 y.o. male is a 60 year old man with medical history significant for mental retardation, schizophrenia, nonobstructive CAD, hypertension, diabetes mellitus, ischemic colitis.  He presented to the hospital after a fall at the group home.  He said a dresser fell on him.   He was hypotensive in the emergency room.  He was found to have acute UTI and acute kidney injury.  He was treated with IV fluids and empiric IV antibiotics.  Urine culture showed staph capitis.  All his antihypertensives were held because of hypotension and orthostatic hypotension requiring IV fluids.  Orthostatic hypotension has resolved.  BP has improved although its borderline low.  His liver enzymes were also elevated.  Acute hepatitis panel for hepatitis A, B, and C was unremarkable.  Liver ultrasound did not show any abnormality.  Elevated liver enzymes have improved.  This was probably from hypotension.  He was evaluated by PT who recommended home health PT.  Overall his condition has improved and he is deemed stable  for discharge to the group home today.       Discharge Exam:    Vitals:   08/21/20 2045 08/21/20 2310 08/22/20 0441 08/22/20 0836  BP: (!) 106/56 (!) 116/58 (!) 94/52 (!) 95/50  Pulse: 98 100 94 89  Resp: 20 17 20 20   Temp: 99.3 F (37.4 C) 99 F (37.2 C) 98 F (36.7 C) 97.6 F (36.4 C)  TempSrc: Oral Oral Oral Oral  SpO2: 98% 100% 99% 97%  Weight:      Height:         GEN: NAD SKIN: No rash EYES: EOMI ENT: MMM CV: RRR PULM: CTA B ABD: soft, ND, NT, +BS CNS: AAO x 3, non focal EXT: No edema or tenderness   The results of significant diagnostics from this hospitalization (including imaging, microbiology, ancillary and laboratory) are listed below for reference.     Procedures and Diagnostic Studies:   DG Knee 1-2 Views Left  Result Date: 08/18/2020 CLINICAL DATA:  Fall with abrasion to the left anterior knee EXAM: LEFT KNEE - 1-2 VIEW COMPARISON:  None. FINDINGS: No evidence of fracture, dislocation, or joint effusion. No evidence of arthropathy or other focal bone abnormality. Soft tissues are unremarkable. IMPRESSION: Negative. Electronically Signed   By: Malachy Moan M.D.   On: 08/18/2020 08:55   CT Head Wo Contrast  Result Date: 08/18/2020 CLINICAL DATA:  Larey Seat, hypotension EXAM: CT HEAD WITHOUT CONTRAST TECHNIQUE: Contiguous axial images were obtained from the base of the skull through the vertex without intravenous contrast. COMPARISON:  None. FINDINGS: Brain: No acute infarct or hemorrhage. Lateral ventricles and midline structures are unremarkable. No acute extra-axial fluid collections. No mass effect. Vascular: No hyperdense vessel or unexpected calcification. Skull: Normal. Negative for fracture or focal lesion. Sinuses/Orbits: No acute finding. Other: None. IMPRESSION: 1. No acute intracranial process. Electronically Signed   By: Sharlet Salina M.D.   On: 08/18/2020 03:45   DG Chest Port 1 View  Result Date: 08/18/2020 CLINICAL DATA:  Pain following  fall EXAM: PORTABLE CHEST 1 VIEW COMPARISON:  July 13, 2018 FINDINGS: There is slight left base atelectasis. Lungs otherwise are clear. Heart size and pulmonary vascularity are normal. No adenopathy. There is aortic atherosclerosis. No bone lesions. IMPRESSION: Slight left base atelectasis. Lungs elsewhere clear. Heart size normal. Aortic Atherosclerosis (ICD10-I70.0). Electronically Signed   By: Bretta Bang III M.D.   On: 08/18/2020 07:59     Labs:   Basic Metabolic Panel: Recent Labs  Lab 08/18/20 0207 08/18/20 0834 08/19/20 0424 08/20/20 0508 08/21/20 0412 08/22/20 0502  NA 137  --  143 141 138 138  K 4.1  --  3.1* 3.9 3.5 3.5  CL 107  --  109 110 107 106  CO2 20*  --  24 21* 23 22  GLUCOSE 140*  --  100* 104* 126* 144*  BUN 55*  --  25* 18 18 22*  CREATININE 1.97* 1.54* 1.12 1.03 1.09 1.01  CALCIUM 8.4*  --  8.5* 8.7* 8.7* 8.5*  MG  --   --  1.5*  --   --   --    GFR Estimated Creatinine Clearance: 64.9 mL/min (by C-G formula based on SCr of 1.01 mg/dL). Liver Function Tests: Recent Labs  Lab 08/18/20 0207 08/19/20 0424 08/20/20 0508 08/21/20 0412 08/22/20 0502  AST 41 138* 466* 236* 114*  ALT 48* 123* 351* 309* 218*  ALKPHOS 61 68 84 77 62  BILITOT 0.5 0.7 0.6 0.4 0.6  PROT 5.6* 6.3* 5.9* 5.5* 5.2*  ALBUMIN 2.8* 2.9* 2.7* 2.6* 2.5*   Recent Labs  Lab 08/18/20 0834  LIPASE 20   No results for input(s): AMMONIA in the last 168 hours. Coagulation profile Recent Labs  Lab 08/19/20 0424  INR 1.0    CBC: Recent Labs  Lab 08/18/20 0207 08/19/20 0424 08/20/20 0508  WBC 11.7* 8.6 4.3  NEUTROABS 9.4*  --  2.5  HGB 10.3* 11.3* 11.9*  HCT 30.6* 34.5* 35.8*  MCV 83.8 85.0 84.8  PLT 209 227 215   Cardiac Enzymes: No results for input(s): CKTOTAL, CKMB, CKMBINDEX, TROPONINI in the last 168 hours. BNP: Invalid input(s): POCBNP CBG: Recent Labs  Lab 08/21/20 0750 08/21/20 1135 08/21/20 1636 08/21/20 2040 08/22/20 0830  GLUCAP 105* 270* 124*  198* 125*   D-Dimer No results for input(s): DDIMER in the last 72 hours. Hgb A1c No results for input(s): HGBA1C in the last 72 hours. Lipid Profile No results for input(s): CHOL, HDL, LDLCALC, TRIG, CHOLHDL, LDLDIRECT in the last 72 hours. Thyroid function studies No results for input(s): TSH, T4TOTAL, T3FREE, THYROIDAB in the last 72 hours.  Invalid input(s): FREET3 Anemia work up No results for input(s): VITAMINB12, FOLATE, FERRITIN, TIBC, IRON, RETICCTPCT in the last 72 hours. Microbiology Recent Results (from the past 240 hour(s))  Resp Panel by RT-PCR (Flu A&B, Covid) Nasopharyngeal  Swab     Status: None   Collection Time: 08/18/20  4:12 AM   Specimen: Nasopharyngeal Swab; Nasopharyngeal(NP) swabs in vial transport medium  Result Value Ref Range Status   SARS Coronavirus 2 by RT PCR NEGATIVE NEGATIVE Final    Comment: (NOTE) SARS-CoV-2 target nucleic acids are NOT DETECTED.  The SARS-CoV-2 RNA is generally detectable in upper respiratory specimens during the acute phase of infection. The lowest concentration of SARS-CoV-2 viral copies this assay can detect is 138 copies/mL. A negative result does not preclude SARS-Cov-2 infection and should not be used as the sole basis for treatment or other patient management decisions. A negative result may occur with  improper specimen collection/handling, submission of specimen other than nasopharyngeal swab, presence of viral mutation(s) within the areas targeted by this assay, and inadequate number of viral copies(<138 copies/mL). A negative result must be combined with clinical observations, patient history, and epidemiological information. The expected result is Negative.  Fact Sheet for Patients:  BloggerCourse.com  Fact Sheet for Healthcare Providers:  SeriousBroker.it  This test is no t yet approved or cleared by the [place] FDA and  has been authorized for detection  and/or diagnosis of SARS-CoV-2 by FDA under an Emergency Use Authorization (EUA). This EUA will remain  in effect (meaning this test can be used) for the duration of the COVID-19 declaration under Section 564(b)(1) of the Act, 21 U.S.C.section 360bbb-3(b)(1), unless the authorization is terminated  or revoked sooner.       Influenza A by PCR NEGATIVE NEGATIVE Final   Influenza B by PCR NEGATIVE NEGATIVE Final    Comment: (NOTE) The Xpert Xpress SARS-CoV-2/FLU/RSV plus assay is intended as an aid in the diagnosis of influenza from Nasopharyngeal swab specimens and should not be used as a sole basis for treatment. Nasal washings and aspirates are unacceptable for Xpert Xpress SARS-CoV-2/FLU/RSV testing.  Fact Sheet for Patients: BloggerCourse.com  Fact Sheet for Healthcare Providers: SeriousBroker.it  This test is not yet approved or cleared by the [place] FDA and has been authorized for detection and/or diagnosis of SARS-CoV-2 by FDA under an Emergency Use Authorization (EUA). This EUA will remain in effect (meaning this test can be used) for the duration of the COVID-19 declaration under Section 564(b)(1) of the Act, 21 U.S.C. section 360bbb-3(b)(1), unless the authorization is terminated or revoked.  Performed at [facility], [address] [place], [place] [address]   Urine Culture     Status: Abnormal   Collection Time: 08/18/20  4:12 AM   Specimen: Urine, Random  Result Value Ref Range Status   Specimen Description   Final    URINE, RANDOM Performed at [facility], [address] [place], [place] [address]    Special Requests   Final    NONE Performed at [facility], [address] [place], [place] [address]    Culture >=100,000 COLONIES/mL STAPHYLOCOCCUS CAPITIS (A)  Final   Report Status 08/20/2020 FINAL  Final   Organism ID, Bacteria STAPHYLOCOCCUS CAPITIS (A)  Final       Susceptibility   Staphylococcus capitis - MIC*    CIPROFLOXACIN <=0.5 SENSITIVE Sensitive     GENTAMICIN <=0.5 SENSITIVE Sensitive     NITROFURANTOIN <=16 SENSITIVE Sensitive     OXACILLIN <=0.25 SENSITIVE Sensitive     TETRACYCLINE <=1 SENSITIVE Sensitive     VANCOMYCIN 2 SENSITIVE Sensitive     TRIMETH/SULFA <=10 SENSITIVE Sensitive     CLINDAMYCIN <=0.25 SENSITIVE Sensitive  RIFAMPIN <=0.5 SENSITIVE Sensitive     Inducible Clindamycin NEGATIVE Sensitive     * >=100,000 COLONIES/mL STAPHYLOCOCCUS CAPITIS  CULTURE, BLOOD (ROUTINE X 2) w Reflex to ID Panel     Status: None (Preliminary result)   Collection Time: 08/18/20  7:58 AM   Specimen: BLOOD  Result Value Ref Range Status   Specimen Description BLOOD BLOOD RIGHT ARM  Final   Special Requests   Final    BOTTLES DRAWN AEROBIC AND ANAEROBIC Blood Culture results may not be optimal due to an inadequate volume of blood received in culture bottles   Culture   Final    NO GROWTH 4 DAYS Performed at [facility], [address] [place], [place] [address]    Report Status PENDING  Incomplete  CULTURE, BLOOD (ROUTINE X 2) w Reflex to ID Panel     Status: None (Preliminary result)   Collection Time: 08/18/20  7:58 AM   Specimen: BLOOD  Result Value Ref Range Status   Specimen Description BLOOD BLOOD LEFT WRIST  Final   Special Requests   Final    BOTTLES DRAWN AEROBIC AND ANAEROBIC Blood Culture results may not be optimal due to an inadequate volume of blood received in culture bottles   Culture   Final    NO GROWTH 4 DAYS Performed at [facility], [address], [place], [place] [address]    Report Status PENDING  Incomplete     Discharge Instructions:   Discharge Instructions    Diet - low sodium heart healthy   Complete by: As directed    Diet Carb Modified   Complete by: As directed    Face-to-face encounter (required for Medicare/Medicaid patients)   Complete by: As directed    I Lief Palmatier certify that this patient is under my care and that I, or a nurse practitioner or physician's assistant working with me, had a face-to-face encounter that meets the physician face-to-face encounter requirements with this patient on 08/22/2020. The encounter with the patient was in whole, or in part for the following medical condition(s) which is the primary reason for home health care (List medical condition): fall at home, debility   The encounter with the patient was in whole, or in part, for the following medical condition, which is the primary reason for home health care: fall at home, debility   I certify that, based on my findings, the following services are medically necessary home health services: Physical therapy   Reason for Medically Necessary Home Health Services: Therapy- Investment banker, operational, Teacher, early years/pre and Stair Training   My clinical findings support the need for the above services: Unsafe ambulation due to balance issues   Further, I certify that my clinical findings support that this patient is homebound due to: Unsafe ambulation due to balance issues   For home use only DME Walker   Complete by: As directed    Patient needs a walker to treat with the following condition: Fall at home   Home Health   Complete by: As directed    To provide the following care/treatments:  PT OT     Increase activity slowly   Complete by: As directed      Allergies as of 08/22/2020   No Known Allergies     Medication List    STOP taking these medications   diltiazem 120 MG 24 hr capsule Commonly known as: CARDIZEM CD   lisinopril 20 MG tablet Commonly known as: ZESTRIL  metoprolol tartrate 100 MG tablet Commonly known as: LOPRESSOR   UNABLE TO FIND     TAKE these medications   acetaminophen 325 MG tablet Commonly known as: TYLENOL Take 2 tablets (650 mg total) by mouth every 8 (eight) hours as needed. What changed:   medication strength  how much to take  when to  take this   amoxicillin 500 MG capsule Commonly known as: AMOXIL Take 1 capsule (500 mg total) by mouth every 8 (eight) hours for 3 days.   aspirin EC 81 MG tablet Take 81 mg by mouth daily.   benztropine 2 MG tablet Commonly known as: COGENTIN Take 2 mg by mouth 2 (two) times daily.   clopidogrel 75 MG tablet Commonly known as: PLAVIX Take 75 mg by mouth daily with breakfast.   Dulaglutide 1.5 MG/0.5ML Sopn Inject 1.5 mg into the skin every Friday.   ferrous sulfate 325 (65 FE) MG tablet Take 1 tablet (325 mg total) by mouth every other day. Start taking on: August 24, 2020   haloperidol 10 MG tablet Commonly known as: HALDOL Take 10 mg by mouth 3 (three) times daily.   Jardiance 25 MG Tabs tablet Generic drug: empagliflozin Take 25 mg by mouth daily.   metFORMIN 500 MG tablet Commonly known as: GLUCOPHAGE Take 500 mg by mouth 2 (two) times daily.   mirtazapine 45 MG tablet Commonly known as: REMERON Take 45 mg by mouth at bedtime.   multivitamin tablet Take 1 tablet by mouth daily.   pantoprazole 40 MG tablet Commonly known as: PROTONIX Take 40 mg by mouth daily.   QUEtiapine 100 MG tablet Commonly known as: SEROQUEL Take 150 mg by mouth at bedtime.   rosuvastatin 40 MG tablet Commonly known as: CRESTOR Take 40 mg by mouth every evening.   terbinafine 250 MG tablet Commonly known as: LAMISIL Take 250 mg by mouth.            Durable Medical Equipment  (From admission, onward)         Start     Ordered   08/22/20 0000  For home use only DME Walker       Question:  Patient needs a walker to treat with the following condition  Answer:  Fall at home   08/22/20 1046            Time coordinating discharge: 32 minutes  Signed:  Lurene ShadowBERNARD Aleks Nawrot  [facility] 08/22/2020, 10:46 AM   Pager on [email or web address]. If 7PM-7AM, please contact night-coverage at [email or web address]

## 2020-08-22 NOTE — Care Management Important Message (Signed)
Important Message  Patient Details  Name: COVEY BALLER MRN: [id number] Date of Birth: 05-23-1961   Medicare Important Message Given:  N/A - LOS <3 / Initial given by admissions  Initial Medicare IM reviewed with Baron Hamper by Bascom Levels, Patient Access Associate on 08/21/2020 at 8:11am.   Johnell Comings 08/22/2020, 8:56 AM

## 2020-08-22 NOTE — TOC Transition Note (Signed)
Transition of Care [facility]) - CM/SW Discharge Note   Patient Details  Name: Michael Wall MRN: [id number] Date of Birth: 07/27/1961  Transition of Care [facility]) CM/SW Contact:  Chapman Fitch, RN Phone Number: 08/22/2020, 2:51 PM   Clinical Narrative:     Patient to discharge back to [facility] today Patient does not have a preference of home health agency.  [place] preference is [facility].  Referral made and accepted by Elnita Maxwell with [facility]  Standard walker delivered to room by patricia with [facility]  Fl2 printed and bedside RN to place in discharge packet Transportation arranged through Brandon and Sasha from [place] to transport   Final next level of care: Home w Home Health Services Barriers to Discharge: No Barriers Identified   Patient Goals and CMS Choice        Discharge Placement                Patient to be transferred to facility by: Drema Balzarine from group home      Discharge Plan and Services                DME Arranged: Dan Humphreys DME Agency: [facility] Date DME Agency Contacted: 08/22/20   Representative spoke with at DME Agency: Elease Hashimoto HH Arranged: PT,OT HH Agency: [facility] Date [facility] Agency Contacted: 08/22/20   Representative spoke with at [facility] Agency: Elnita Maxwell  Social Determinants of Health (SDOH) Interventions     Readmission Risk Interventions No flowsheet data found.

## 2020-08-22 NOTE — Progress Notes (Signed)
D/C home to group home order verified. VSS, no signs distress. Denies pain/concerns. Taken to [facility] via wheelchair.  Reviewed AVS, medications, and follow up appointments with Blase Mess group home staff member.  AVS and FL2 handed to her.  Patient departed in transport Michael Wall with belongings in hand- also handed patient's walker.

## 2020-08-22 NOTE — NC FL2 (Signed)
[place] MEDICAID FL2 LEVEL OF CARE SCREENING TOOL     IDENTIFICATION  Patient Name: Michael Wall Birthdate: Jun 11, 1961 Sex: male Admission Date (Current Location): 08/18/2020  [facility] and [place] Number:  Chiropodist and Address:         Provider Number: [id number]  Attending Physician Name and Address:  Lurene Shadow, MD  Relative Name and Phone Number:       Current Level of Care: Hospital Recommended Level of Care:  (group home) Prior Approval Number:    Date Approved/Denied:   PASRR Number:    Discharge Plan: Other (Comment) (group home)    Current Diagnoses: Patient Active Problem List   Diagnosis Date Noted  . Elevated liver enzymes 08/20/2020  . Protein-calorie malnutrition, severe 08/19/2020  . Acute UTI 08/19/2020  . AKI (acute kidney injury) (HCC) 08/19/2020  . UTI (urinary tract infection) 08/18/2020  . Hypotension 08/18/2020  . Unstable angina (HCC) 12/25/2018  . Two-vessel coronary artery disease 12/25/2018  . Coronary artery disease 12/03/2017  . Diabetes (HCC) 12/03/2017  . Ischemic colitis (HCC) 12/03/2017  . Dyspnea 02/25/2017  . Acute myocardial infarction (HCC) 02/25/2017  . Esophageal reflux disease 02/25/2017  . History of type 2 diabetes mellitus 02/25/2017  . Hx of coronary artery disease 02/25/2017    Orientation RESPIRATION BLADDER Height & Weight     Self,Time,Situation,Place  Normal Continent Weight: 59 kg Height:  5\' 9"  (175.3 cm)  BEHAVIORAL SYMPTOMS/MOOD NEUROLOGICAL BOWEL NUTRITION STATUS      Continent Diet (regular)  AMBULATORY STATUS COMMUNICATION OF NEEDS Skin   Limited Assist Verbally Normal                       Personal Care Assistance Level of Assistance              Functional Limitations Info             SPECIAL CARE FACTORS FREQUENCY  PT (By licensed PT),OT (By licensed OT)     PT Frequency: set up with [facility] home health              Contractures Contractures  Info: Not present    Additional Factors Info  Code Status,Allergies Code Status Info: Full Allergies Info: NKDA            TAKE these medications       acetaminophen 325 MG tablet Commonly known as: TYLENOL Take 2 tablets (650 mg total) by mouth every 8 (eight) hours as needed. What changed:   medication strength  how much to take  when to take this   amoxicillin 500 MG capsule Commonly known as: AMOXIL Take 1 capsule (500 mg total) by mouth every 8 (eight) hours for 3 days.   aspirin EC 81 MG tablet Take 81 mg by mouth daily.   benztropine 2 MG tablet Commonly known as: COGENTIN Take 2 mg by mouth 2 (two) times daily.   clopidogrel 75 MG tablet Commonly known as: PLAVIX Take 75 mg by mouth daily with breakfast.   Dulaglutide 1.5 MG/0.5ML Sopn Inject 1.5 mg into the skin every Friday.   ferrous sulfate 325 (65 FE) MG tablet Take 1 tablet (325 mg total) by mouth every other day. Start taking on: August 24, 2020   haloperidol 10 MG tablet Commonly known as: HALDOL Take 10 mg by mouth 3 (three) times daily.   Jardiance 25 MG Tabs tablet Generic drug: empagliflozin Take 25 mg by mouth daily.  metFORMIN 500 MG tablet Commonly known as: GLUCOPHAGE Take 500 mg by mouth 2 (two) times daily.   mirtazapine 45 MG tablet Commonly known as: REMERON Take 45 mg by mouth at bedtime.   multivitamin tablet Take 1 tablet by mouth daily.   pantoprazole 40 MG tablet Commonly known as: PROTONIX Take 40 mg by mouth daily.   QUEtiapine 100 MG tablet Commonly known as: SEROQUEL Take 150 mg by mouth at bedtime.   rosuvastatin 40 MG tablet Commonly known as: CRESTOR Take 40 mg by mouth every evening.   terbinafine 250 MG tablet Commonly known as: LAMISIL Take 250 mg by mouth.       Relevant Imaging Results:  Relevant Lab Results:   Additional Information    Chapman Fitch, RN

## 2020-08-23 LAB — CULTURE, BLOOD (ROUTINE X 2)
Culture: NO GROWTH
Culture: NO GROWTH

## 2020-08-24 ENCOUNTER — Telehealth (HOSPITAL_COMMUNITY): Payer: Self-pay

## 2020-08-24 NOTE — Telephone Encounter (Signed)
Received a fax from [facility] to do a PA on this patient's medication. He is NOT one of our patients so did not do a PA

## 2020-09-22 ENCOUNTER — Other Ambulatory Visit: Payer: Self-pay

## 2020-09-22 ENCOUNTER — Encounter: Payer: Self-pay | Admitting: Podiatry

## 2020-09-22 ENCOUNTER — Ambulatory Visit (INDEPENDENT_AMBULATORY_CARE_PROVIDER_SITE_OTHER): Payer: Medicare Other | Admitting: Podiatry

## 2020-09-22 DIAGNOSIS — D689 Coagulation defect, unspecified: Secondary | ICD-10-CM

## 2020-09-22 DIAGNOSIS — M79609 Pain in unspecified limb: Secondary | ICD-10-CM

## 2020-09-22 DIAGNOSIS — E119 Type 2 diabetes mellitus without complications: Secondary | ICD-10-CM

## 2020-09-22 DIAGNOSIS — B351 Tinea unguium: Secondary | ICD-10-CM

## 2020-09-22 NOTE — Progress Notes (Signed)
This patient returns to my office for at risk foot care.  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  Patient is taking plavix.  This patient is unable to cut nails himself since the patient cannot reach his nails.These nails are painful walking and wearing shoes.  This patient presents for at risk foot care today.  General Appearance  Alert, conversant and in no acute stress.  Vascular  Dorsalis pedis and posterior tibial  pulses are palpable  bilaterally.  Capillary return is within normal limits  bilaterally. Temperature is within normal limits  bilaterally.  Neurologic  Senn-Weinstein monofilament wire test diminished   bilaterally. Muscle power within normal limits bilaterally.  Nails Thick disfigured discolored nails with subungual debris  from hallux to fifth toes bilaterally. No evidence of bacterial infection or drainage bilaterally.  Orthopedic  No limitations of motion  feet .  No crepitus or effusions noted.  No bony pathology or digital deformities noted.  Skin  normotropic skin with no porokeratosis noted bilaterally.  No signs of infections or ulcers noted.     Onychomycosis  Pain in right toes  Pain in left toes  Consent was obtained for treatment procedures.   Mechanical debridement of nails 1-5  bilaterally performed with a nail nipper.  Filed with dremel without incident.    Return office visit  4 months                    Told patient to return for periodic foot care and evaluation due to potential at risk complications.   Malcomb Gangemi DPM  

## 2020-12-12 DIAGNOSIS — E119 Type 2 diabetes mellitus without complications: Secondary | ICD-10-CM | POA: Diagnosis not present

## 2020-12-12 DIAGNOSIS — H5213 Myopia, bilateral: Secondary | ICD-10-CM | POA: Diagnosis not present

## 2021-01-23 ENCOUNTER — Ambulatory Visit: Payer: Medicare Other | Admitting: Podiatry

## 2021-01-23 DIAGNOSIS — H524 Presbyopia: Secondary | ICD-10-CM | POA: Diagnosis not present

## 2021-01-26 ENCOUNTER — Other Ambulatory Visit: Payer: Self-pay

## 2021-01-26 ENCOUNTER — Ambulatory Visit (INDEPENDENT_AMBULATORY_CARE_PROVIDER_SITE_OTHER): Payer: Medicare Other | Admitting: Podiatry

## 2021-01-26 ENCOUNTER — Encounter: Payer: Self-pay | Admitting: Podiatry

## 2021-01-26 DIAGNOSIS — D689 Coagulation defect, unspecified: Secondary | ICD-10-CM

## 2021-01-26 DIAGNOSIS — B351 Tinea unguium: Secondary | ICD-10-CM

## 2021-01-26 DIAGNOSIS — E119 Type 2 diabetes mellitus without complications: Secondary | ICD-10-CM

## 2021-01-26 DIAGNOSIS — M79609 Pain in unspecified limb: Secondary | ICD-10-CM | POA: Diagnosis not present

## 2021-01-26 NOTE — Progress Notes (Signed)
This patient returns to my office for at risk foot care.  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  Patient is taking plavix.  This patient is unable to cut nails himself since the patient cannot reach his nails.These nails are painful walking and wearing shoes.  This patient presents for at risk foot care today.  General Appearance  Alert, conversant and in no acute stress.  Vascular  Dorsalis pedis and posterior tibial  pulses are palpable  bilaterally.  Capillary return is within normal limits  bilaterally. Temperature is within normal limits  bilaterally.  Neurologic  Senn-Weinstein monofilament wire test diminished   bilaterally. Muscle power within normal limits bilaterally.  Nails Thick disfigured discolored nails with subungual debris  from hallux to fifth toes bilaterally. No evidence of bacterial infection or drainage bilaterally.  Orthopedic  No limitations of motion  feet .  No crepitus or effusions noted.  No bony pathology or digital deformities noted.  Skin  normotropic skin with no porokeratosis noted bilaterally.  No signs of infections or ulcers noted.     Onychomycosis  Pain in right toes  Pain in left toes  Consent was obtained for treatment procedures.   Mechanical debridement of nails 1-5  bilaterally performed with a nail nipper.  Filed with dremel without incident.    Return office visit  4 months                    Told patient to return for periodic foot care and evaluation due to potential at risk complications.   Porchea Charrier DPM  

## 2021-02-10 DIAGNOSIS — E119 Type 2 diabetes mellitus without complications: Secondary | ICD-10-CM | POA: Diagnosis not present

## 2021-02-10 DIAGNOSIS — E785 Hyperlipidemia, unspecified: Secondary | ICD-10-CM | POA: Diagnosis not present

## 2021-02-15 DIAGNOSIS — R600 Localized edema: Secondary | ICD-10-CM | POA: Diagnosis not present

## 2021-02-15 DIAGNOSIS — E785 Hyperlipidemia, unspecified: Secondary | ICD-10-CM | POA: Diagnosis not present

## 2021-02-15 DIAGNOSIS — J301 Allergic rhinitis due to pollen: Secondary | ICD-10-CM | POA: Diagnosis not present

## 2021-02-15 DIAGNOSIS — I10 Essential (primary) hypertension: Secondary | ICD-10-CM | POA: Diagnosis not present

## 2021-02-15 DIAGNOSIS — R7989 Other specified abnormal findings of blood chemistry: Secondary | ICD-10-CM | POA: Diagnosis not present

## 2021-02-15 DIAGNOSIS — I2510 Atherosclerotic heart disease of native coronary artery without angina pectoris: Secondary | ICD-10-CM | POA: Diagnosis not present

## 2021-02-15 DIAGNOSIS — K219 Gastro-esophageal reflux disease without esophagitis: Secondary | ICD-10-CM | POA: Diagnosis not present

## 2021-02-15 DIAGNOSIS — E1310 Other specified diabetes mellitus with ketoacidosis without coma: Secondary | ICD-10-CM | POA: Diagnosis not present

## 2021-02-15 DIAGNOSIS — M13869 Other specified arthritis, unspecified knee: Secondary | ICD-10-CM | POA: Diagnosis not present

## 2021-03-03 DIAGNOSIS — I10 Essential (primary) hypertension: Secondary | ICD-10-CM | POA: Diagnosis not present

## 2021-03-03 DIAGNOSIS — K219 Gastro-esophageal reflux disease without esophagitis: Secondary | ICD-10-CM | POA: Diagnosis not present

## 2021-03-03 DIAGNOSIS — E782 Mixed hyperlipidemia: Secondary | ICD-10-CM | POA: Diagnosis not present

## 2021-03-03 DIAGNOSIS — I2510 Atherosclerotic heart disease of native coronary artery without angina pectoris: Secondary | ICD-10-CM | POA: Diagnosis not present

## 2021-03-03 DIAGNOSIS — R0602 Shortness of breath: Secondary | ICD-10-CM | POA: Diagnosis not present

## 2021-03-03 DIAGNOSIS — I219 Acute myocardial infarction, unspecified: Secondary | ICD-10-CM | POA: Diagnosis not present

## 2021-03-03 DIAGNOSIS — E119 Type 2 diabetes mellitus without complications: Secondary | ICD-10-CM | POA: Diagnosis not present

## 2021-04-14 DIAGNOSIS — R0600 Dyspnea, unspecified: Secondary | ICD-10-CM | POA: Diagnosis not present

## 2021-05-05 DIAGNOSIS — I2510 Atherosclerotic heart disease of native coronary artery without angina pectoris: Secondary | ICD-10-CM | POA: Diagnosis not present

## 2021-05-05 DIAGNOSIS — I10 Essential (primary) hypertension: Secondary | ICD-10-CM | POA: Diagnosis not present

## 2021-05-05 DIAGNOSIS — K219 Gastro-esophageal reflux disease without esophagitis: Secondary | ICD-10-CM | POA: Diagnosis not present

## 2021-05-05 DIAGNOSIS — R0602 Shortness of breath: Secondary | ICD-10-CM | POA: Diagnosis not present

## 2021-05-05 DIAGNOSIS — I219 Acute myocardial infarction, unspecified: Secondary | ICD-10-CM | POA: Diagnosis not present

## 2021-05-05 DIAGNOSIS — I255 Ischemic cardiomyopathy: Secondary | ICD-10-CM | POA: Diagnosis not present

## 2021-05-16 DIAGNOSIS — E119 Type 2 diabetes mellitus without complications: Secondary | ICD-10-CM | POA: Diagnosis not present

## 2021-05-16 DIAGNOSIS — I10 Essential (primary) hypertension: Secondary | ICD-10-CM | POA: Diagnosis not present

## 2021-05-17 DIAGNOSIS — R0602 Shortness of breath: Secondary | ICD-10-CM | POA: Diagnosis not present

## 2021-05-19 DIAGNOSIS — K219 Gastro-esophageal reflux disease without esophagitis: Secondary | ICD-10-CM | POA: Diagnosis not present

## 2021-05-19 DIAGNOSIS — E1340 Other specified diabetes mellitus with diabetic neuropathy, unspecified: Secondary | ICD-10-CM | POA: Diagnosis not present

## 2021-05-19 DIAGNOSIS — I10 Essential (primary) hypertension: Secondary | ICD-10-CM | POA: Diagnosis not present

## 2021-05-19 DIAGNOSIS — D649 Anemia, unspecified: Secondary | ICD-10-CM | POA: Diagnosis not present

## 2021-05-19 DIAGNOSIS — R600 Localized edema: Secondary | ICD-10-CM | POA: Diagnosis not present

## 2021-05-19 DIAGNOSIS — I2510 Atherosclerotic heart disease of native coronary artery without angina pectoris: Secondary | ICD-10-CM | POA: Diagnosis not present

## 2021-05-19 DIAGNOSIS — R0602 Shortness of breath: Secondary | ICD-10-CM | POA: Diagnosis not present

## 2021-05-19 DIAGNOSIS — R079 Chest pain, unspecified: Secondary | ICD-10-CM | POA: Diagnosis not present

## 2021-05-19 DIAGNOSIS — J301 Allergic rhinitis due to pollen: Secondary | ICD-10-CM | POA: Diagnosis not present

## 2021-05-19 DIAGNOSIS — I219 Acute myocardial infarction, unspecified: Secondary | ICD-10-CM | POA: Diagnosis not present

## 2021-05-19 DIAGNOSIS — E785 Hyperlipidemia, unspecified: Secondary | ICD-10-CM | POA: Diagnosis not present

## 2021-05-19 DIAGNOSIS — Z0001 Encounter for general adult medical examination with abnormal findings: Secondary | ICD-10-CM | POA: Diagnosis not present

## 2021-05-19 DIAGNOSIS — E119 Type 2 diabetes mellitus without complications: Secondary | ICD-10-CM | POA: Diagnosis not present

## 2021-05-19 DIAGNOSIS — R7989 Other specified abnormal findings of blood chemistry: Secondary | ICD-10-CM | POA: Diagnosis not present

## 2021-05-19 DIAGNOSIS — M13869 Other specified arthritis, unspecified knee: Secondary | ICD-10-CM | POA: Diagnosis not present

## 2021-05-29 DIAGNOSIS — R072 Precordial pain: Secondary | ICD-10-CM | POA: Diagnosis not present

## 2021-06-01 ENCOUNTER — Ambulatory Visit (INDEPENDENT_AMBULATORY_CARE_PROVIDER_SITE_OTHER): Payer: Medicare Other | Admitting: Podiatry

## 2021-06-01 ENCOUNTER — Other Ambulatory Visit: Payer: Self-pay

## 2021-06-01 ENCOUNTER — Encounter (INDEPENDENT_AMBULATORY_CARE_PROVIDER_SITE_OTHER): Payer: Self-pay

## 2021-06-01 ENCOUNTER — Encounter: Payer: Self-pay | Admitting: Podiatry

## 2021-06-01 DIAGNOSIS — B351 Tinea unguium: Secondary | ICD-10-CM

## 2021-06-01 DIAGNOSIS — E119 Type 2 diabetes mellitus without complications: Secondary | ICD-10-CM

## 2021-06-01 DIAGNOSIS — D689 Coagulation defect, unspecified: Secondary | ICD-10-CM | POA: Diagnosis not present

## 2021-06-01 DIAGNOSIS — M79609 Pain in unspecified limb: Secondary | ICD-10-CM | POA: Diagnosis not present

## 2021-06-01 NOTE — Progress Notes (Signed)
This patient returns to my office for at risk foot care.  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  Patient is taking plavix.  This patient is unable to cut nails himself since the patient cannot reach his nails.These nails are painful walking and wearing shoes.  This patient presents for at risk foot care today.  General Appearance  Alert, conversant and in no acute stress.  Vascular  Dorsalis pedis and posterior tibial  pulses are palpable  bilaterally.  Capillary return is within normal limits  bilaterally. Temperature is within normal limits  bilaterally.  Neurologic  Senn-Weinstein monofilament wire test diminished   bilaterally. Muscle power within normal limits bilaterally.  Nails Thick disfigured discolored nails with subungual debris  from hallux to fifth toes bilaterally. No evidence of bacterial infection or drainage bilaterally.  Orthopedic  No limitations of motion  feet .  No crepitus or effusions noted.  No bony pathology or digital deformities noted.  Skin  normotropic skin with no porokeratosis noted bilaterally.  No signs of infections or ulcers noted.     Onychomycosis  Pain in right toes  Pain in left toes  Consent was obtained for treatment procedures.   Mechanical debridement of nails 1-5  bilaterally performed with a nail nipper.  Filed with dremel without incident.    Return office visit  4 months                    Told patient to return for periodic foot care and evaluation due to potential at risk complications.   Shinika Estelle DPM  

## 2021-06-02 DIAGNOSIS — R9431 Abnormal electrocardiogram [ECG] [EKG]: Secondary | ICD-10-CM | POA: Diagnosis not present

## 2021-06-02 DIAGNOSIS — I2510 Atherosclerotic heart disease of native coronary artery without angina pectoris: Secondary | ICD-10-CM | POA: Diagnosis not present

## 2021-06-02 DIAGNOSIS — I10 Essential (primary) hypertension: Secondary | ICD-10-CM | POA: Diagnosis not present

## 2021-06-02 DIAGNOSIS — R0602 Shortness of breath: Secondary | ICD-10-CM | POA: Diagnosis not present

## 2021-08-15 DIAGNOSIS — E119 Type 2 diabetes mellitus without complications: Secondary | ICD-10-CM | POA: Diagnosis not present

## 2021-08-18 DIAGNOSIS — E119 Type 2 diabetes mellitus without complications: Secondary | ICD-10-CM | POA: Diagnosis not present

## 2021-08-18 DIAGNOSIS — D649 Anemia, unspecified: Secondary | ICD-10-CM | POA: Diagnosis not present

## 2021-08-18 DIAGNOSIS — M13869 Other specified arthritis, unspecified knee: Secondary | ICD-10-CM | POA: Diagnosis not present

## 2021-08-18 DIAGNOSIS — I10 Essential (primary) hypertension: Secondary | ICD-10-CM | POA: Diagnosis not present

## 2021-08-18 DIAGNOSIS — E785 Hyperlipidemia, unspecified: Secondary | ICD-10-CM | POA: Diagnosis not present

## 2021-08-18 DIAGNOSIS — E1340 Other specified diabetes mellitus with diabetic neuropathy, unspecified: Secondary | ICD-10-CM | POA: Diagnosis not present

## 2021-08-18 DIAGNOSIS — R600 Localized edema: Secondary | ICD-10-CM | POA: Diagnosis not present

## 2021-08-18 DIAGNOSIS — J301 Allergic rhinitis due to pollen: Secondary | ICD-10-CM | POA: Diagnosis not present

## 2021-08-18 DIAGNOSIS — K219 Gastro-esophageal reflux disease without esophagitis: Secondary | ICD-10-CM | POA: Diagnosis not present

## 2021-09-01 DIAGNOSIS — R0602 Shortness of breath: Secondary | ICD-10-CM | POA: Diagnosis not present

## 2021-09-01 DIAGNOSIS — I2510 Atherosclerotic heart disease of native coronary artery without angina pectoris: Secondary | ICD-10-CM | POA: Diagnosis not present

## 2021-09-01 DIAGNOSIS — K219 Gastro-esophageal reflux disease without esophagitis: Secondary | ICD-10-CM | POA: Diagnosis not present

## 2021-09-01 DIAGNOSIS — I219 Acute myocardial infarction, unspecified: Secondary | ICD-10-CM | POA: Diagnosis not present

## 2021-09-01 DIAGNOSIS — I10 Essential (primary) hypertension: Secondary | ICD-10-CM | POA: Diagnosis not present

## 2021-10-05 ENCOUNTER — Ambulatory Visit (INDEPENDENT_AMBULATORY_CARE_PROVIDER_SITE_OTHER): Payer: Medicare Other | Admitting: Podiatry

## 2021-10-05 ENCOUNTER — Other Ambulatory Visit: Payer: Self-pay

## 2021-10-05 ENCOUNTER — Encounter: Payer: Self-pay | Admitting: Podiatry

## 2021-10-05 DIAGNOSIS — B351 Tinea unguium: Secondary | ICD-10-CM | POA: Diagnosis not present

## 2021-10-05 DIAGNOSIS — M79609 Pain in unspecified limb: Secondary | ICD-10-CM | POA: Diagnosis not present

## 2021-10-05 DIAGNOSIS — D689 Coagulation defect, unspecified: Secondary | ICD-10-CM

## 2021-10-05 DIAGNOSIS — E119 Type 2 diabetes mellitus without complications: Secondary | ICD-10-CM

## 2021-10-05 NOTE — Progress Notes (Signed)
This patient returns to my office for at risk foot care.  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  Patient is taking plavix.  This patient is unable to cut nails himself since the patient cannot reach his nails.These nails are painful walking and wearing shoes.  This patient presents for at risk foot care today.  General Appearance  Alert, conversant and in no acute stress.  Vascular  Dorsalis pedis and posterior tibial  pulses are palpable  bilaterally.  Capillary return is within normal limits  bilaterally. Temperature is within normal limits  bilaterally.  Neurologic  Senn-Weinstein monofilament wire test diminished   bilaterally. Muscle power within normal limits bilaterally.  Nails Thick disfigured discolored nails with subungual debris  from hallux to fifth toes bilaterally. No evidence of bacterial infection or drainage bilaterally.  Orthopedic  No limitations of motion  feet .  No crepitus or effusions noted.  No bony pathology or digital deformities noted.  Skin  normotropic skin with no porokeratosis noted bilaterally.  No signs of infections or ulcers noted.     Onychomycosis  Pain in right toes  Pain in left toes  Consent was obtained for treatment procedures.   Mechanical debridement of nails 1-5  bilaterally performed with a nail nipper.  Filed with dremel without incident.    Return office visit  4 months                    Told patient to return for periodic foot care and evaluation due to potential at risk complications.   Kipling Graser DPM  

## 2021-11-10 DIAGNOSIS — I2510 Atherosclerotic heart disease of native coronary artery without angina pectoris: Secondary | ICD-10-CM | POA: Diagnosis not present

## 2021-11-10 DIAGNOSIS — E119 Type 2 diabetes mellitus without complications: Secondary | ICD-10-CM | POA: Diagnosis not present

## 2021-11-10 DIAGNOSIS — I10 Essential (primary) hypertension: Secondary | ICD-10-CM | POA: Diagnosis not present

## 2021-12-13 DIAGNOSIS — H2513 Age-related nuclear cataract, bilateral: Secondary | ICD-10-CM | POA: Diagnosis not present

## 2021-12-22 DIAGNOSIS — E119 Type 2 diabetes mellitus without complications: Secondary | ICD-10-CM | POA: Diagnosis not present

## 2021-12-26 DIAGNOSIS — E1340 Other specified diabetes mellitus with diabetic neuropathy, unspecified: Secondary | ICD-10-CM | POA: Diagnosis not present

## 2021-12-26 DIAGNOSIS — D649 Anemia, unspecified: Secondary | ICD-10-CM | POA: Diagnosis not present

## 2021-12-26 DIAGNOSIS — J301 Allergic rhinitis due to pollen: Secondary | ICD-10-CM | POA: Diagnosis not present

## 2021-12-26 DIAGNOSIS — I10 Essential (primary) hypertension: Secondary | ICD-10-CM | POA: Diagnosis not present

## 2021-12-26 DIAGNOSIS — I2510 Atherosclerotic heart disease of native coronary artery without angina pectoris: Secondary | ICD-10-CM | POA: Diagnosis not present

## 2021-12-26 DIAGNOSIS — K219 Gastro-esophageal reflux disease without esophagitis: Secondary | ICD-10-CM | POA: Diagnosis not present

## 2021-12-26 DIAGNOSIS — M13869 Other specified arthritis, unspecified knee: Secondary | ICD-10-CM | POA: Diagnosis not present

## 2022-01-26 DIAGNOSIS — K219 Gastro-esophageal reflux disease without esophagitis: Secondary | ICD-10-CM | POA: Diagnosis not present

## 2022-01-26 DIAGNOSIS — F17200 Nicotine dependence, unspecified, uncomplicated: Secondary | ICD-10-CM | POA: Diagnosis not present

## 2022-01-26 DIAGNOSIS — I10 Essential (primary) hypertension: Secondary | ICD-10-CM | POA: Diagnosis not present

## 2022-01-26 DIAGNOSIS — E782 Mixed hyperlipidemia: Secondary | ICD-10-CM | POA: Diagnosis not present

## 2022-01-26 DIAGNOSIS — I2510 Atherosclerotic heart disease of native coronary artery without angina pectoris: Secondary | ICD-10-CM | POA: Diagnosis not present

## 2022-01-26 DIAGNOSIS — E119 Type 2 diabetes mellitus without complications: Secondary | ICD-10-CM | POA: Diagnosis not present

## 2022-02-08 ENCOUNTER — Encounter: Payer: Self-pay | Admitting: Podiatry

## 2022-02-08 ENCOUNTER — Ambulatory Visit (INDEPENDENT_AMBULATORY_CARE_PROVIDER_SITE_OTHER): Payer: Medicare Other | Admitting: Podiatry

## 2022-02-08 DIAGNOSIS — M79609 Pain in unspecified limb: Secondary | ICD-10-CM

## 2022-02-08 DIAGNOSIS — D689 Coagulation defect, unspecified: Secondary | ICD-10-CM | POA: Diagnosis not present

## 2022-02-08 DIAGNOSIS — B351 Tinea unguium: Secondary | ICD-10-CM

## 2022-02-08 DIAGNOSIS — E119 Type 2 diabetes mellitus without complications: Secondary | ICD-10-CM | POA: Diagnosis not present

## 2022-02-08 NOTE — Progress Notes (Signed)
This patient returns to my office for at risk foot care.  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  Patient is taking plavix.  This patient is unable to cut nails himself since the patient cannot reach his nails.These nails are painful walking and wearing shoes.  This patient presents for at risk foot care today.  General Appearance  Alert, conversant and in no acute stress.  Vascular  Dorsalis pedis and posterior tibial  pulses are palpable  bilaterally.  Capillary return is within normal limits  bilaterally. Temperature is within normal limits  bilaterally.  Neurologic  Senn-Weinstein monofilament wire test diminished   bilaterally. Muscle power within normal limits bilaterally.  Nails Thick disfigured discolored nails with subungual debris  from hallux to fifth toes bilaterally. No evidence of bacterial infection or drainage bilaterally.  Orthopedic  No limitations of motion  feet .  No crepitus or effusions noted.  No bony pathology or digital deformities noted.  Skin  normotropic skin with no porokeratosis noted bilaterally.  No signs of infections or ulcers noted.     Onychomycosis  Pain in right toes  Pain in left toes  Consent was obtained for treatment procedures.   Mechanical debridement of nails 1-5  bilaterally performed with a nail nipper.  Filed with dremel without incident.    Return office visit  4 months                    Told patient to return for periodic foot care and evaluation due to potential at risk complications.   Cayne Yom DPM  

## 2022-03-12 DIAGNOSIS — I2510 Atherosclerotic heart disease of native coronary artery without angina pectoris: Secondary | ICD-10-CM | POA: Diagnosis not present

## 2022-03-12 DIAGNOSIS — I10 Essential (primary) hypertension: Secondary | ICD-10-CM | POA: Diagnosis not present

## 2022-03-12 DIAGNOSIS — E119 Type 2 diabetes mellitus without complications: Secondary | ICD-10-CM | POA: Diagnosis not present

## 2022-03-30 DIAGNOSIS — D649 Anemia, unspecified: Secondary | ICD-10-CM | POA: Diagnosis not present

## 2022-03-30 DIAGNOSIS — E119 Type 2 diabetes mellitus without complications: Secondary | ICD-10-CM | POA: Diagnosis not present

## 2022-04-02 DIAGNOSIS — K219 Gastro-esophageal reflux disease without esophagitis: Secondary | ICD-10-CM | POA: Diagnosis not present

## 2022-04-02 DIAGNOSIS — J301 Allergic rhinitis due to pollen: Secondary | ICD-10-CM | POA: Diagnosis not present

## 2022-04-02 DIAGNOSIS — E785 Hyperlipidemia, unspecified: Secondary | ICD-10-CM | POA: Diagnosis not present

## 2022-04-02 DIAGNOSIS — M13869 Other specified arthritis, unspecified knee: Secondary | ICD-10-CM | POA: Diagnosis not present

## 2022-04-02 DIAGNOSIS — R600 Localized edema: Secondary | ICD-10-CM | POA: Diagnosis not present

## 2022-04-02 DIAGNOSIS — D649 Anemia, unspecified: Secondary | ICD-10-CM | POA: Diagnosis not present

## 2022-06-14 ENCOUNTER — Ambulatory Visit (INDEPENDENT_AMBULATORY_CARE_PROVIDER_SITE_OTHER): Payer: Medicare Other | Admitting: Podiatry

## 2022-06-14 ENCOUNTER — Encounter: Payer: Self-pay | Admitting: Podiatry

## 2022-06-14 DIAGNOSIS — D689 Coagulation defect, unspecified: Secondary | ICD-10-CM

## 2022-06-14 DIAGNOSIS — E119 Type 2 diabetes mellitus without complications: Secondary | ICD-10-CM

## 2022-06-14 DIAGNOSIS — M79609 Pain in unspecified limb: Secondary | ICD-10-CM | POA: Diagnosis not present

## 2022-06-14 DIAGNOSIS — B351 Tinea unguium: Secondary | ICD-10-CM

## 2022-06-14 NOTE — Progress Notes (Signed)
This patient returns to my office for at risk foot care.  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  Patient is taking plavix.  This patient is unable to cut nails himself since the patient cannot reach his nails.These nails are painful walking and wearing shoes.  This patient presents for at risk foot care today.  General Appearance  Alert, conversant and in no acute stress.  Vascular  Dorsalis pedis and posterior tibial  pulses are palpable  bilaterally.  Capillary return is within normal limits  bilaterally. Temperature is within normal limits  bilaterally.  Neurologic  Senn-Weinstein monofilament wire test diminished   bilaterally. Muscle power within normal limits bilaterally.  Nails Thick disfigured discolored nails with subungual debris  from hallux to fifth toes bilaterally. No evidence of bacterial infection or drainage bilaterally.  Orthopedic  No limitations of motion  feet .  No crepitus or effusions noted.  No bony pathology or digital deformities noted.  Skin  normotropic skin with no porokeratosis noted bilaterally.  No signs of infections or ulcers noted.     Onychomycosis  Pain in right toes  Pain in left toes  Consent was obtained for treatment procedures.   Mechanical debridement of nails 1-5  bilaterally performed with a nail nipper.  Filed with dremel without incident.    Return office visit  4 months                    Told patient to return for periodic foot care and evaluation due to potential at risk complications.   Gardiner Barefoot DPM

## 2022-06-29 DIAGNOSIS — D649 Anemia, unspecified: Secondary | ICD-10-CM | POA: Diagnosis not present

## 2022-06-29 DIAGNOSIS — E119 Type 2 diabetes mellitus without complications: Secondary | ICD-10-CM | POA: Diagnosis not present

## 2022-07-03 DIAGNOSIS — Z0001 Encounter for general adult medical examination with abnormal findings: Secondary | ICD-10-CM | POA: Diagnosis not present

## 2022-07-03 DIAGNOSIS — M13869 Other specified arthritis, unspecified knee: Secondary | ICD-10-CM | POA: Diagnosis not present

## 2022-07-03 DIAGNOSIS — E119 Type 2 diabetes mellitus without complications: Secondary | ICD-10-CM | POA: Diagnosis not present

## 2022-07-03 DIAGNOSIS — R600 Localized edema: Secondary | ICD-10-CM | POA: Diagnosis not present

## 2022-07-03 DIAGNOSIS — E785 Hyperlipidemia, unspecified: Secondary | ICD-10-CM | POA: Diagnosis not present

## 2022-07-03 DIAGNOSIS — I2510 Atherosclerotic heart disease of native coronary artery without angina pectoris: Secondary | ICD-10-CM | POA: Diagnosis not present

## 2022-07-03 DIAGNOSIS — J301 Allergic rhinitis due to pollen: Secondary | ICD-10-CM | POA: Diagnosis not present

## 2022-07-03 DIAGNOSIS — K219 Gastro-esophageal reflux disease without esophagitis: Secondary | ICD-10-CM | POA: Diagnosis not present

## 2022-07-27 DIAGNOSIS — R0602 Shortness of breath: Secondary | ICD-10-CM | POA: Diagnosis not present

## 2022-07-27 DIAGNOSIS — Z9861 Coronary angioplasty status: Secondary | ICD-10-CM | POA: Diagnosis not present

## 2022-07-27 DIAGNOSIS — E119 Type 2 diabetes mellitus without complications: Secondary | ICD-10-CM | POA: Diagnosis not present

## 2022-07-27 DIAGNOSIS — K219 Gastro-esophageal reflux disease without esophagitis: Secondary | ICD-10-CM | POA: Diagnosis not present

## 2022-07-27 DIAGNOSIS — I2510 Atherosclerotic heart disease of native coronary artery without angina pectoris: Secondary | ICD-10-CM | POA: Diagnosis not present

## 2022-07-27 DIAGNOSIS — I10 Essential (primary) hypertension: Secondary | ICD-10-CM | POA: Diagnosis not present

## 2022-08-02 DIAGNOSIS — I10 Essential (primary) hypertension: Secondary | ICD-10-CM | POA: Diagnosis not present

## 2022-08-02 DIAGNOSIS — R0602 Shortness of breath: Secondary | ICD-10-CM | POA: Diagnosis not present

## 2022-08-02 DIAGNOSIS — E119 Type 2 diabetes mellitus without complications: Secondary | ICD-10-CM | POA: Diagnosis not present

## 2022-08-02 DIAGNOSIS — E782 Mixed hyperlipidemia: Secondary | ICD-10-CM | POA: Diagnosis not present

## 2022-08-02 DIAGNOSIS — K219 Gastro-esophageal reflux disease without esophagitis: Secondary | ICD-10-CM | POA: Diagnosis not present

## 2022-08-02 DIAGNOSIS — I2510 Atherosclerotic heart disease of native coronary artery without angina pectoris: Secondary | ICD-10-CM | POA: Diagnosis not present

## 2022-08-02 DIAGNOSIS — F17200 Nicotine dependence, unspecified, uncomplicated: Secondary | ICD-10-CM | POA: Diagnosis not present

## 2022-08-09 DIAGNOSIS — R0602 Shortness of breath: Secondary | ICD-10-CM | POA: Diagnosis not present

## 2022-08-09 DIAGNOSIS — I051 Rheumatic mitral insufficiency: Secondary | ICD-10-CM | POA: Diagnosis not present

## 2022-08-10 DIAGNOSIS — K219 Gastro-esophageal reflux disease without esophagitis: Secondary | ICD-10-CM | POA: Diagnosis not present

## 2022-08-10 DIAGNOSIS — R0602 Shortness of breath: Secondary | ICD-10-CM | POA: Diagnosis not present

## 2022-08-10 DIAGNOSIS — I2510 Atherosclerotic heart disease of native coronary artery without angina pectoris: Secondary | ICD-10-CM | POA: Diagnosis not present

## 2022-08-10 DIAGNOSIS — E782 Mixed hyperlipidemia: Secondary | ICD-10-CM | POA: Diagnosis not present

## 2022-08-10 DIAGNOSIS — I10 Essential (primary) hypertension: Secondary | ICD-10-CM | POA: Diagnosis not present

## 2022-09-20 ENCOUNTER — Ambulatory Visit: Payer: Medicare Other | Admitting: Podiatry

## 2022-09-27 ENCOUNTER — Other Ambulatory Visit: Payer: 59

## 2022-09-27 ENCOUNTER — Other Ambulatory Visit: Payer: Self-pay | Admitting: Internal Medicine

## 2022-09-27 DIAGNOSIS — I10 Essential (primary) hypertension: Secondary | ICD-10-CM | POA: Diagnosis not present

## 2022-09-27 DIAGNOSIS — Z0001 Encounter for general adult medical examination with abnormal findings: Secondary | ICD-10-CM | POA: Diagnosis not present

## 2022-09-27 DIAGNOSIS — E119 Type 2 diabetes mellitus without complications: Secondary | ICD-10-CM | POA: Diagnosis not present

## 2022-09-29 LAB — COMPREHENSIVE METABOLIC PANEL
ALT: 26 IU/L (ref 0–44)
AST: 23 IU/L (ref 0–40)
Albumin/Globulin Ratio: 2.3 — ABNORMAL HIGH (ref 1.2–2.2)
Albumin: 4.4 g/dL (ref 3.9–4.9)
Alkaline Phosphatase: 71 IU/L (ref 44–121)
BUN/Creatinine Ratio: 21 (ref 10–24)
BUN: 17 mg/dL (ref 8–27)
Bilirubin Total: 0.4 mg/dL (ref 0.0–1.2)
CO2: 18 mmol/L — ABNORMAL LOW (ref 20–29)
Calcium: 9.3 mg/dL (ref 8.6–10.2)
Chloride: 108 mmol/L — ABNORMAL HIGH (ref 96–106)
Creatinine, Ser: 0.82 mg/dL (ref 0.76–1.27)
Globulin, Total: 1.9 g/dL (ref 1.5–4.5)
Glucose: 117 mg/dL — ABNORMAL HIGH (ref 70–99)
Potassium: 4.4 mmol/L (ref 3.5–5.2)
Sodium: 148 mmol/L — ABNORMAL HIGH (ref 134–144)
Total Protein: 6.3 g/dL (ref 6.0–8.5)
eGFR: 99 mL/min/{1.73_m2} (ref >59)

## 2022-09-29 LAB — LIPID PANEL W/O CHOL/HDL RATIO
Cholesterol, Total: 94 mg/dL — ABNORMAL LOW (ref 100–199)
HDL: 43 mg/dL (ref >39)
LDL Chol Calc (NIH): 32 mg/dL (ref 0–99)
Triglycerides: 100 mg/dL (ref 0–149)
VLDL Cholesterol Cal: 19 mg/dL (ref 5–40)

## 2022-09-29 LAB — CBC WITH DIFFERENTIAL/PLATELET
Basophils Absolute: 0 10*3/uL (ref 0.0–0.2)
Basos: 0 %
EOS (ABSOLUTE): 0.1 10*3/uL (ref 0.0–0.4)
Eos: 2 %
Hematocrit: 39.9 % (ref 37.5–51.0)
Hemoglobin: 12.9 g/dL — ABNORMAL LOW (ref 13.0–17.7)
Immature Grans (Abs): 0 10*3/uL (ref 0.0–0.1)
Immature Granulocytes: 0 %
Lymphocytes Absolute: 2 10*3/uL (ref 0.7–3.1)
Lymphs: 37 %
MCH: 30.2 pg (ref 26.6–33.0)
MCHC: 32.3 g/dL (ref 31.5–35.7)
MCV: 93 fL (ref 79–97)
Monocytes Absolute: 0.4 10*3/uL (ref 0.1–0.9)
Monocytes: 8 %
Neutrophils Absolute: 2.9 10*3/uL (ref 1.4–7.0)
Neutrophils: 53 %
Platelets: 194 10*3/uL (ref 150–450)
RBC: 4.27 x10E6/uL (ref 4.14–5.80)
RDW: 13.1 % (ref 11.6–15.4)
WBC: 5.5 10*3/uL (ref 3.4–10.8)

## 2022-09-29 LAB — PSA, SERUM (SERIAL MONITOR): Prostate Specific Ag, Serum: <0.1 ng/mL (ref 0.0–4.0)

## 2022-09-29 LAB — HGB A1C W/O EAG: Hgb A1c MFr Bld: 6.1 % — ABNORMAL HIGH (ref 4.8–5.6)

## 2022-10-01 ENCOUNTER — Other Ambulatory Visit: Payer: Self-pay | Admitting: Internal Medicine

## 2022-10-03 ENCOUNTER — Encounter: Payer: Self-pay | Admitting: Internal Medicine

## 2022-10-03 ENCOUNTER — Ambulatory Visit (INDEPENDENT_AMBULATORY_CARE_PROVIDER_SITE_OTHER): Payer: 59 | Admitting: Internal Medicine

## 2022-10-03 VITALS — BP 120/70 | HR 90 | Ht 71.0 in | Wt 141.2 lb

## 2022-10-03 DIAGNOSIS — E782 Mixed hyperlipidemia: Secondary | ICD-10-CM | POA: Diagnosis not present

## 2022-10-03 DIAGNOSIS — Z0001 Encounter for general adult medical examination with abnormal findings: Secondary | ICD-10-CM

## 2022-10-03 DIAGNOSIS — D508 Other iron deficiency anemias: Secondary | ICD-10-CM

## 2022-10-03 DIAGNOSIS — I2510 Atherosclerotic heart disease of native coronary artery without angina pectoris: Secondary | ICD-10-CM | POA: Diagnosis not present

## 2022-10-03 DIAGNOSIS — E870 Hyperosmolality and hypernatremia: Secondary | ICD-10-CM | POA: Diagnosis not present

## 2022-10-03 DIAGNOSIS — E119 Type 2 diabetes mellitus without complications: Secondary | ICD-10-CM | POA: Diagnosis not present

## 2022-10-03 LAB — GLUCOSE, POCT (MANUAL RESULT ENTRY): POC Glucose: 118 mg/dl — AB (ref 70–99)

## 2022-10-03 MED ORDER — FERROUS SULFATE 325 (65 FE) MG PO TABS: 325.0000 mg | ORAL_TABLET | ORAL | 0 refills | Status: DC

## 2022-10-03 MED ORDER — EMPAGLIFLOZIN 25 MG PO TABS: 25.0000 mg | ORAL_TABLET | Freq: Every morning | ORAL | 1 refills | Status: DC

## 2022-10-03 MED ORDER — OZEMPIC (1 MG/DOSE) 4 MG/3ML ~~LOC~~ SOPN: 1.0000 mg | PEN_INJECTOR | SUBCUTANEOUS | 0 refills | Status: DC

## 2022-10-03 MED ORDER — METFORMIN HCL 500 MG PO TABS: 500.0000 mg | ORAL_TABLET | Freq: Two times a day (BID) | ORAL | 0 refills | Status: DC

## 2022-10-03 NOTE — Progress Notes (Signed)
Established Patient Office Visit  Subjective:  Patient ID: Michael Wall, male    DOB: 12-19-1960  Age: 62 y.o. MRN: [id number]  No chief complaint on file.   No new complaints, here CPE, for lab review and medication refills. Labs reviewed and notable for improvement in diabetic control, A1c 6.1, LDL at target but sodium elevated to which he admits poor oral intake.   Past Medical History:  Diagnosis Date   Arthritis    Coronary artery disease    Diabetes (HCC)    Dyspnea    GERD (gastroesophageal reflux disease)    HBP (high blood pressure)    Myocardial infarction (HCC)    Schizophrenia (HCC)     Social History   Socioeconomic History   Marital status: Single    Spouse name: Not on file   Number of children: Not on file   Years of education: Not on file   Highest education level: Not on file  Occupational History   Not on file  Tobacco Use   Smoking status: Former   Smokeless tobacco: Never   Tobacco comments:    quit 1946  Vaping Use   Vaping Use: Never used  Substance and Sexual Activity   Alcohol use: No   Drug use: Not Currently    Types: Marijuana   Sexual activity: Not on file  Other Topics Concern   Not on file  Social History Narrative   Not on file   Social Determinants of Health   Financial Resource Strain: Not on file  Food Insecurity: Not on file  Transportation Needs: Not on file  Physical Activity: Not on file  Stress: Not on file  Social Connections: Not on file  Intimate Partner Violence: Not on file    Family History  Problem Relation Age of Onset   Heart disease Mother    Stroke Father    Heart disease Father     No Known Allergies  Review of Systems  Constitutional: Negative.   HENT: Negative.    Eyes: Negative.   Respiratory: Negative.    Cardiovascular: Negative.  Negative for chest pain and palpitations.  Gastrointestinal: Negative.   Genitourinary: Negative.   Musculoskeletal: Negative.   Skin: Negative.    Neurological: Negative.   Endo/Heme/Allergies: Negative.        Objective:   BP 120/70   Pulse 90   Ht 5' 11"$  (1.803 m)   Wt 141 lb 3.2 oz (64 kg)   SpO2 99%   BMI 19.69 kg/m   Vitals:   10/03/22 1054  BP: 120/70  Pulse: 90  Height: 5' 11"$  (1.803 m)  Weight: 141 lb 3.2 oz (64 kg)  SpO2: 99%  BMI (Calculated): 19.7    Physical Exam   Results for orders placed or performed in visit on 10/03/22  POCT Glucose (CBG)  Result Value Ref Range   POC Glucose 118 (A) 70 - 99 mg/dl    Recent Results (from the past 2160 hour(Latrice Storlie))  CBC with Differential/Platelet     Status: Abnormal   Collection Time: 09/27/22  9:00 AM  Result Value Ref Range   WBC 5.5 3.4 - 10.8 x10E3/uL   RBC 4.27 4.14 - 5.80 x10E6/uL   Hemoglobin 12.9 (L) 13.0 - 17.7 g/dL   Hematocrit 39.9 37.5 - 51.0 %   MCV 93 79 - 97 fL   MCH 30.2 26.6 - 33.0 pg   MCHC 32.3 31.5 - 35.7 g/dL   RDW 13.1 11.6 - 15.4 %  Platelets 194 150 - 450 x10E3/uL   Neutrophils 53 Not Estab. %   Lymphs 37 Not Estab. %   Monocytes 8 Not Estab. %   Eos 2 Not Estab. %   Basos 0 Not Estab. %   Neutrophils Absolute 2.9 1.4 - 7.0 x10E3/uL   Lymphocytes Absolute 2.0 0.7 - 3.1 x10E3/uL   Monocytes Absolute 0.4 0.1 - 0.9 x10E3/uL   EOS (ABSOLUTE) 0.1 0.0 - 0.4 x10E3/uL   Basophils Absolute 0.0 0.0 - 0.2 x10E3/uL   Immature Granulocytes 0 Not Estab. %   Immature Grans (Abs) 0.0 0.0 - 0.1 x10E3/uL  Comprehensive metabolic panel     Status: Abnormal   Collection Time: 09/27/22  9:00 AM  Result Value Ref Range   Glucose 117 (H) 70 - 99 mg/dL   BUN 17 8 - 27 mg/dL   Creatinine, Ser 0.82 0.76 - 1.27 mg/dL   eGFR 99 >59 mL/min/1.73   BUN/Creatinine Ratio 21 10 - 24   Sodium 148 (H) 134 - 144 mmol/L   Potassium 4.4 3.5 - 5.2 mmol/L   Chloride 108 (H) 96 - 106 mmol/L   CO2 18 (L) 20 - 29 mmol/L   Calcium 9.3 8.6 - 10.2 mg/dL   Total Protein 6.3 6.0 - 8.5 g/dL   Albumin 4.4 3.9 - 4.9 g/dL   Globulin, Total 1.9 1.5 - 4.5 g/dL    Albumin/Globulin Ratio 2.3 (H) 1.2 - 2.2   Bilirubin Total 0.4 0.0 - 1.2 mg/dL   Alkaline Phosphatase 71 44 - 121 IU/L   AST 23 0 - 40 IU/L   ALT 26 0 - 44 IU/L  Lipid Panel w/o Chol/HDL Ratio     Status: Abnormal   Collection Time: 09/27/22  9:00 AM  Result Value Ref Range   Cholesterol, Total 94 (L) 100 - 199 mg/dL   Triglycerides 100 0 - 149 mg/dL   HDL 43 >39 mg/dL   VLDL Cholesterol Cal 19 5 - 40 mg/dL   LDL Chol Calc (NIH) 32 0 - 99 mg/dL  PSA, SERUM (SERIAL MONITOR)     Status: None   Collection Time: 09/27/22  9:00 AM  Result Value Ref Range   Prostate Specific Ag, Serum <0.1 0.0 - 4.0 ng/mL    Comment: Roche ECLIA methodology. According to the American Urological Association, Serum PSA should decrease and remain at undetectable levels after radical prostatectomy. The AUA defines biochemical recurrence as an initial PSA value 0.2 ng/mL or greater followed by a subsequent confirmatory PSA value 0.2 ng/mL or greater. Values obtained with different assay methods or kits cannot be used interchangeably. Results cannot be interpreted as absolute evidence of the presence or absence of malignant disease.   Hgb A1c w/o eAG     Status: Abnormal   Collection Time: 09/27/22  9:00 AM  Result Value Ref Range   Hgb A1c MFr Bld 6.1 (H) 4.8 - 5.6 %    Comment:          Prediabetes: 5.7 - 6.4          Diabetes: >6.4          Glycemic control for adults with diabetes: <7.0   POCT Glucose (CBG)     Status: Abnormal   Collection Time: 10/03/22 11:03 AM  Result Value Ref Range   POC Glucose 118 (A) 70 - 99 mg/dl      Assessment & Plan:   Problem List Items Addressed This Visit       Endocrine  Diabetes ([place]) - Primary   Relevant Orders   POCT Glucose (CBG) (Completed)  1. Type 2 diabetes mellitus without complication, without long-term current use of insulin (HCC) - POCT Glucose (CBG) - Hemoglobin A1c; Future - empagliflozin (JARDIANCE) 25 MG TABS tablet; Take 1 tablet (25 mg  total) by mouth every morning.  Dispense: 90 tablet; Refill: 1 - metFORMIN (GLUCOPHAGE) 500 MG tablet; Take 1 tablet (500 mg total) by mouth 2 (two) times daily.  Dispense: 180 tablet; Refill: 0  2. Coronary artery disease involving native heart without angina pectoris, unspecified vessel or lesion type  3. Hypernatremia  4. Mixed hyperlipidemia - CK; Future - Lipid panel; Future - Hepatic function panel; Future  5. Other iron deficiency anemia - ferrous sulfate 325 (65 FE) MG tablet; Take 1 tablet (325 mg total) by mouth every other day.  Dispense: 30 tablet; Refill: 0    No follow-ups on file.   Total time spent: 30 minutes  Volanda Napoleon, MD  10/03/2022

## 2022-10-09 ENCOUNTER — Telehealth: Payer: Self-pay

## 2022-10-09 ENCOUNTER — Other Ambulatory Visit: Payer: Self-pay

## 2022-10-09 NOTE — Telephone Encounter (Signed)
Patient caregiver called stating that the ferrous sulfate was d/c on 07/18/22 but looks like we refilled it when he was here for his cpe. Does the patient needd to cintinue to take this medication?

## 2022-10-11 ENCOUNTER — Encounter: Payer: Self-pay | Admitting: Podiatry

## 2022-10-11 ENCOUNTER — Other Ambulatory Visit: Payer: Self-pay

## 2022-10-11 ENCOUNTER — Ambulatory Visit (INDEPENDENT_AMBULATORY_CARE_PROVIDER_SITE_OTHER): Payer: 59 | Admitting: Podiatry

## 2022-10-11 VITALS — BP 120/67 | HR 81

## 2022-10-11 DIAGNOSIS — B351 Tinea unguium: Secondary | ICD-10-CM | POA: Diagnosis not present

## 2022-10-11 DIAGNOSIS — E119 Type 2 diabetes mellitus without complications: Secondary | ICD-10-CM | POA: Diagnosis not present

## 2022-10-11 DIAGNOSIS — M79609 Pain in unspecified limb: Secondary | ICD-10-CM | POA: Diagnosis not present

## 2022-10-11 MED ORDER — ONE-DAILY MULTI VITAMINS PO TABS: 1.0000 | ORAL_TABLET | Freq: Every day | ORAL | 11 refills | Status: DC

## 2022-10-11 MED ORDER — PANTOPRAZOLE SODIUM 40 MG PO TBEC: 40.0000 mg | DELAYED_RELEASE_TABLET | Freq: Every day | ORAL | 11 refills | Status: DC

## 2022-10-11 NOTE — Telephone Encounter (Signed)
Patient caregiver Elige Ko informed

## 2022-10-11 NOTE — Progress Notes (Signed)
This patient returns to my office for at risk foot care.  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  Patient is taking plavix.  This patient is unable to cut nails himself since the patient cannot reach his nails.These nails are painful walking and wearing shoes.  This patient presents for at risk foot care today.  General Appearance  Alert, conversant and in no acute stress.  Vascular  Dorsalis pedis and posterior tibial  pulses are palpable  bilaterally.  Capillary return is within normal limits  bilaterally. Temperature is within normal limits  bilaterally.  Neurologic  Senn-Weinstein monofilament wire test diminished   bilaterally. Muscle power within normal limits bilaterally.  Nails Thick disfigured discolored nails with subungual debris  from hallux to fifth toes bilaterally. No evidence of bacterial infection or drainage bilaterally.  Orthopedic  No limitations of motion  feet .  No crepitus or effusions noted.  No bony pathology or digital deformities noted.  Skin  normotropic skin with no porokeratosis noted bilaterally.  No signs of infections or ulcers noted.     Onychomycosis  Pain in right toes  Pain in left toes  Consent was obtained for treatment procedures.   Mechanical debridement of nails 1-5  bilaterally performed with a nail nipper.  Filed with dremel without incident.  Bleeding noted subungually left hallux nail.   Return office visit  4 months                    Told patient to return for periodic foot care and evaluation due to potential at risk complications.   Gardiner Barefoot DPM

## 2022-10-23 ENCOUNTER — Other Ambulatory Visit: Payer: Self-pay

## 2022-10-23 MED ORDER — ROSUVASTATIN CALCIUM 40 MG PO TABS: 40.0000 mg | ORAL_TABLET | Freq: Every evening | ORAL | 5 refills | Status: DC

## 2022-11-11 IMAGING — CT CT HEAD W/O CM
3 series · 16 of 47 positions shown, 19 images · non-contrast
Comparison: None.

CLINICAL DATA: Fell, hypotension

EXAM:
CT HEAD WITHOUT CONTRAST
TECHNIQUE: Contiguous axial images were obtained from the base of the skull
through the vertex without intravenous contrast.

[Series 3: head wo · axial · 0.44mm/px · z∈[+468,+608]mm · 10 of 34 slices shown, 13 images]
[im 3/34  brain]
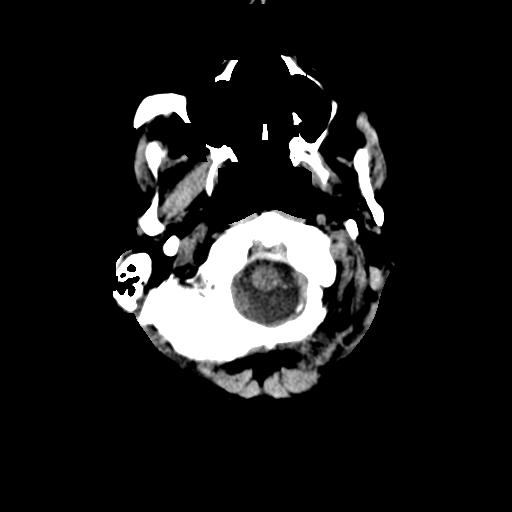
[im 3/34  bone]
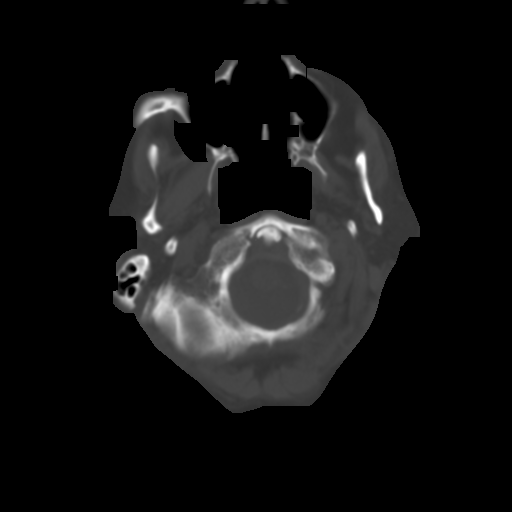
[im 6/34  brain]
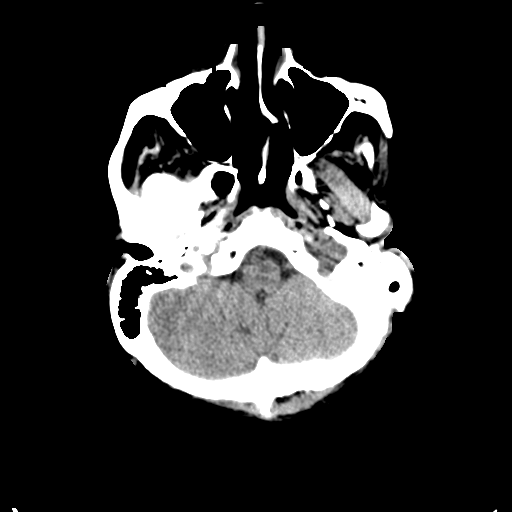
[im 10/34  brain]
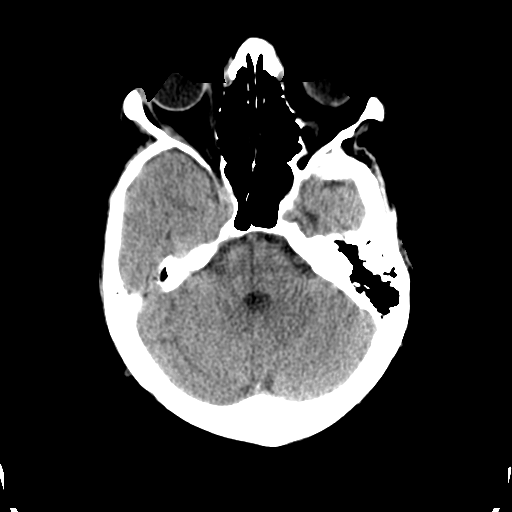
[im 12/34  brain]
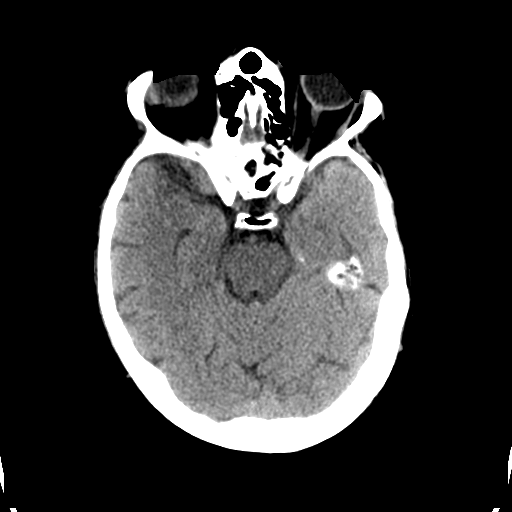
[im 15/34  brain]
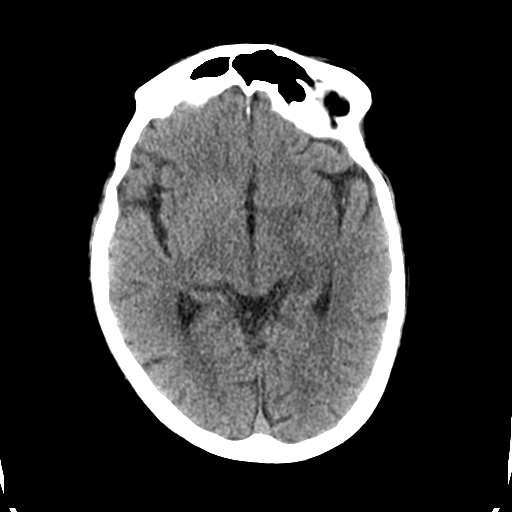
[im 15/34  bone]
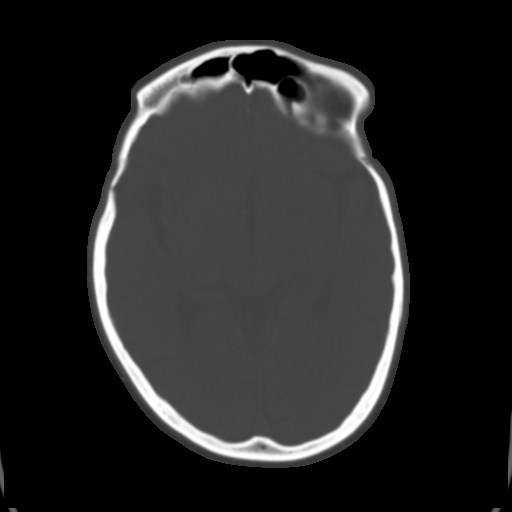
[im 19/34  brain]
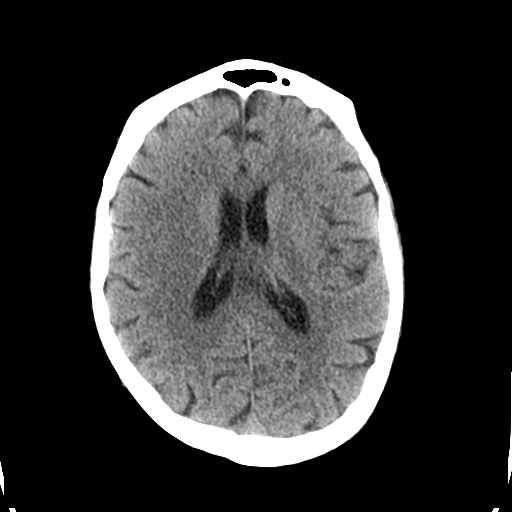
[im 22/34  brain]
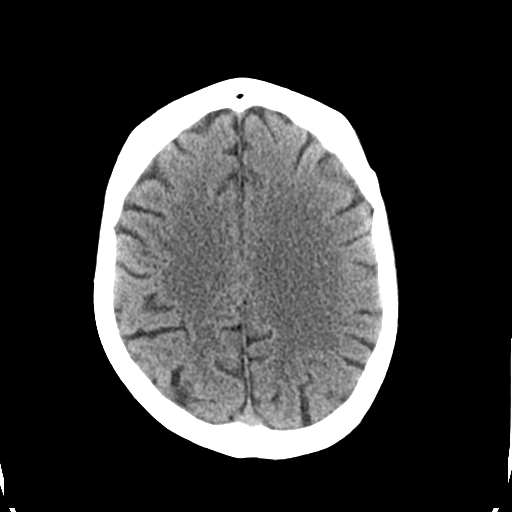
[im 26/34  brain]
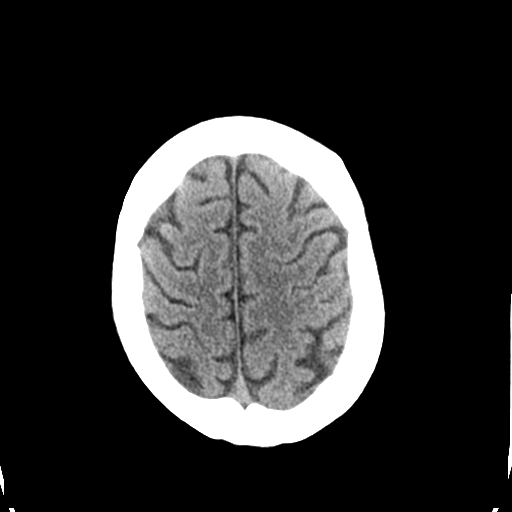
[im 28/34  brain]
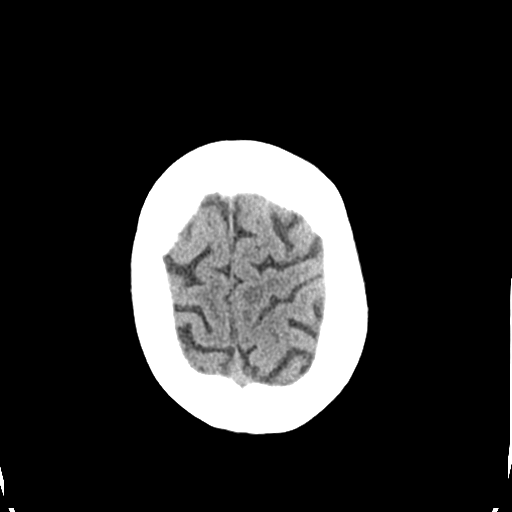
[im 28/34  bone]
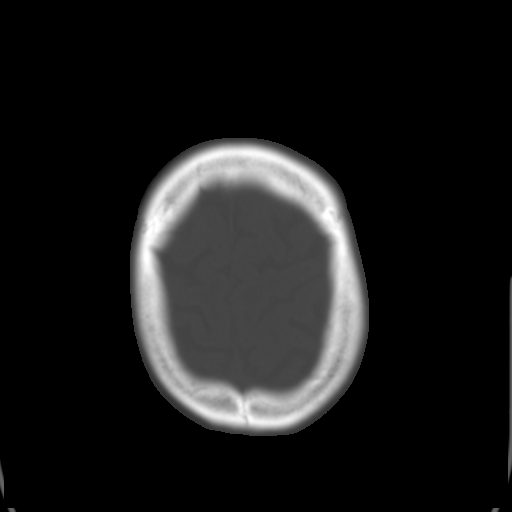
[im 31/34  brain]
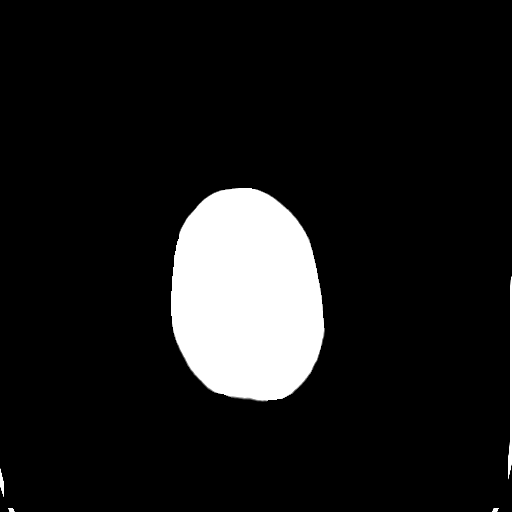

[Series 4: coronal soft tissue · coronal · 0.32mm/px · 3 of 66 slices shown]
[im 22/66  brain]
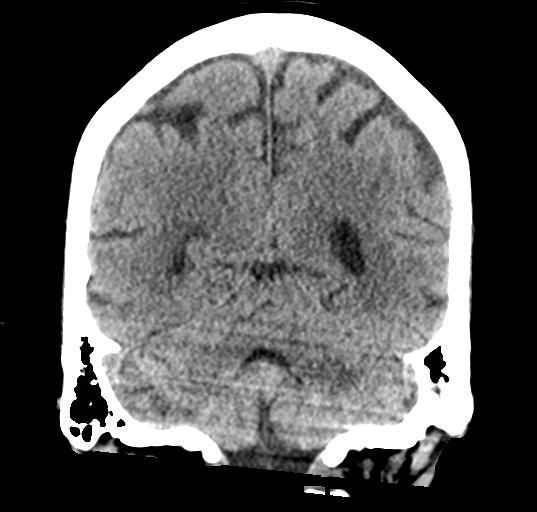
[im 29/66  brain]
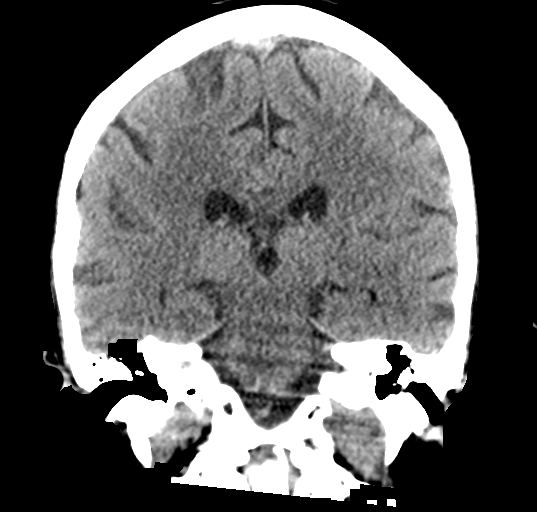
[im 37/66  brain]
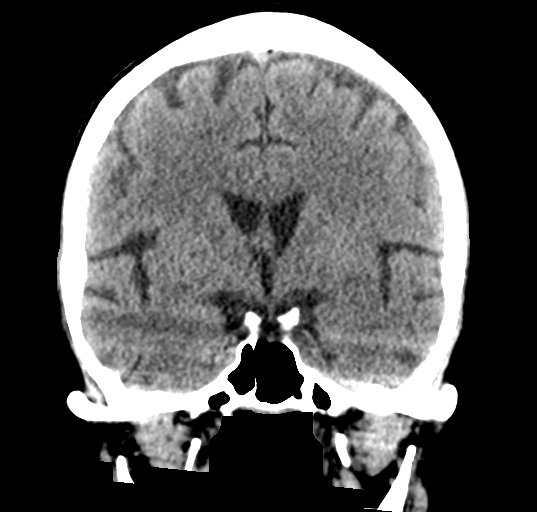

[Series 5: sagittal soft tissue · sagittal · 0.32mm/px · 3 of 57 slices shown]
[im 19/57  brain]
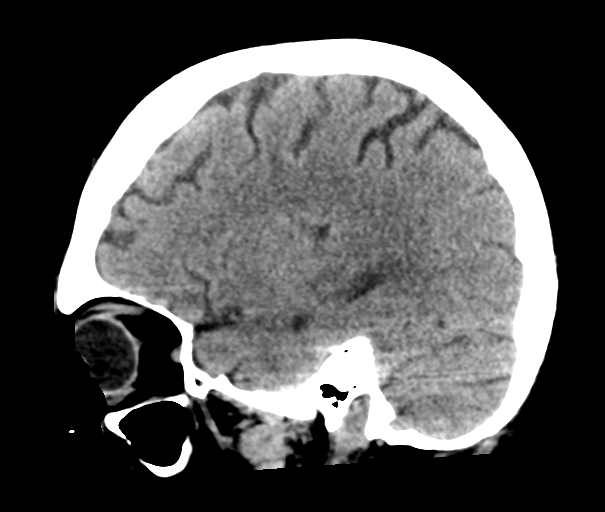
[im 29/57  brain]
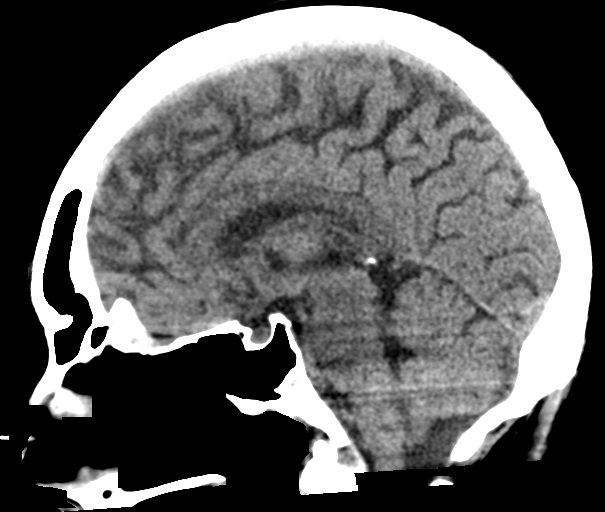
[im 38/57  brain]
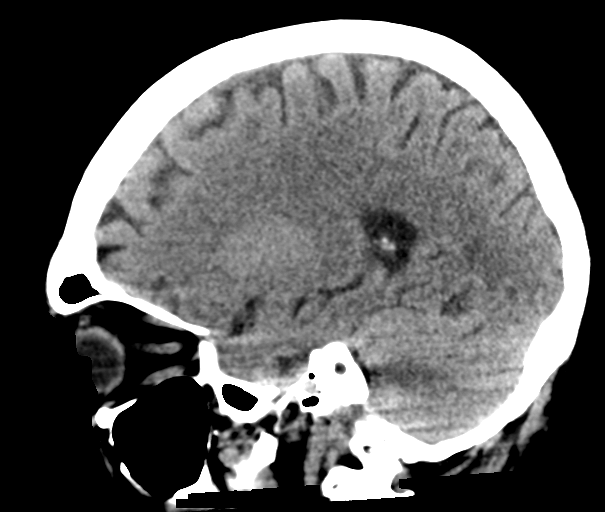

[16 of 47 positions shown; findings below may reference images not displayed]

FINDINGS: Brain: No acute infarct or hemorrhage. Lateral ventricles and
midline structures are unremarkable. No acute extra-axial fluid
collections. No mass effect.

Vascular: No hyperdense vessel or unexpected calcification.

Skull: Normal. Negative for fracture or focal lesion.

Sinuses/Orbits: No acute finding.

Other: None.
IMPRESSION: 1. No acute intracranial process.

## 2022-11-11 IMAGING — DX DG KNEE 1-2V*L*
2 series · 2 of 2 positions shown · non-contrast
Comparison: None.

CLINICAL DATA: Fall with abrasion to the left anterior knee

EXAM:
LEFT KNEE - 1-2 VIEW

[knee ap]
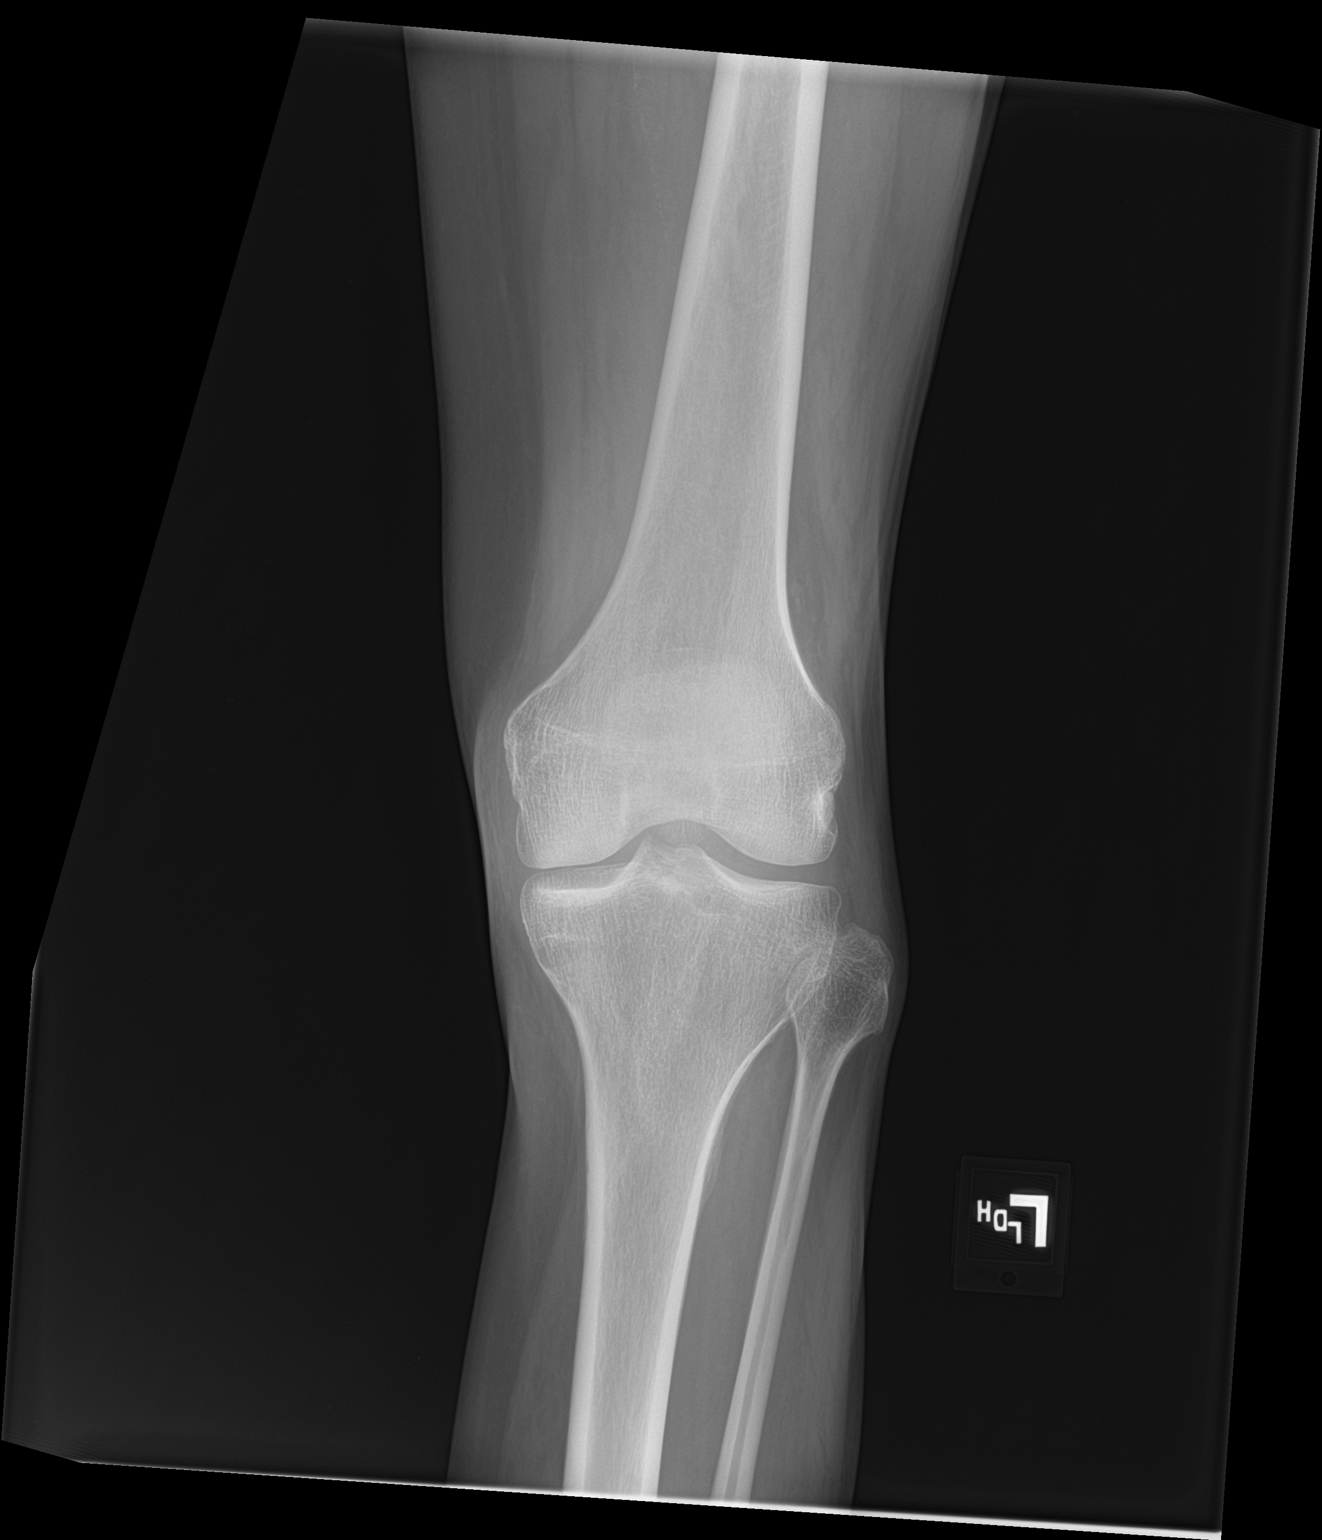

[knee lat]
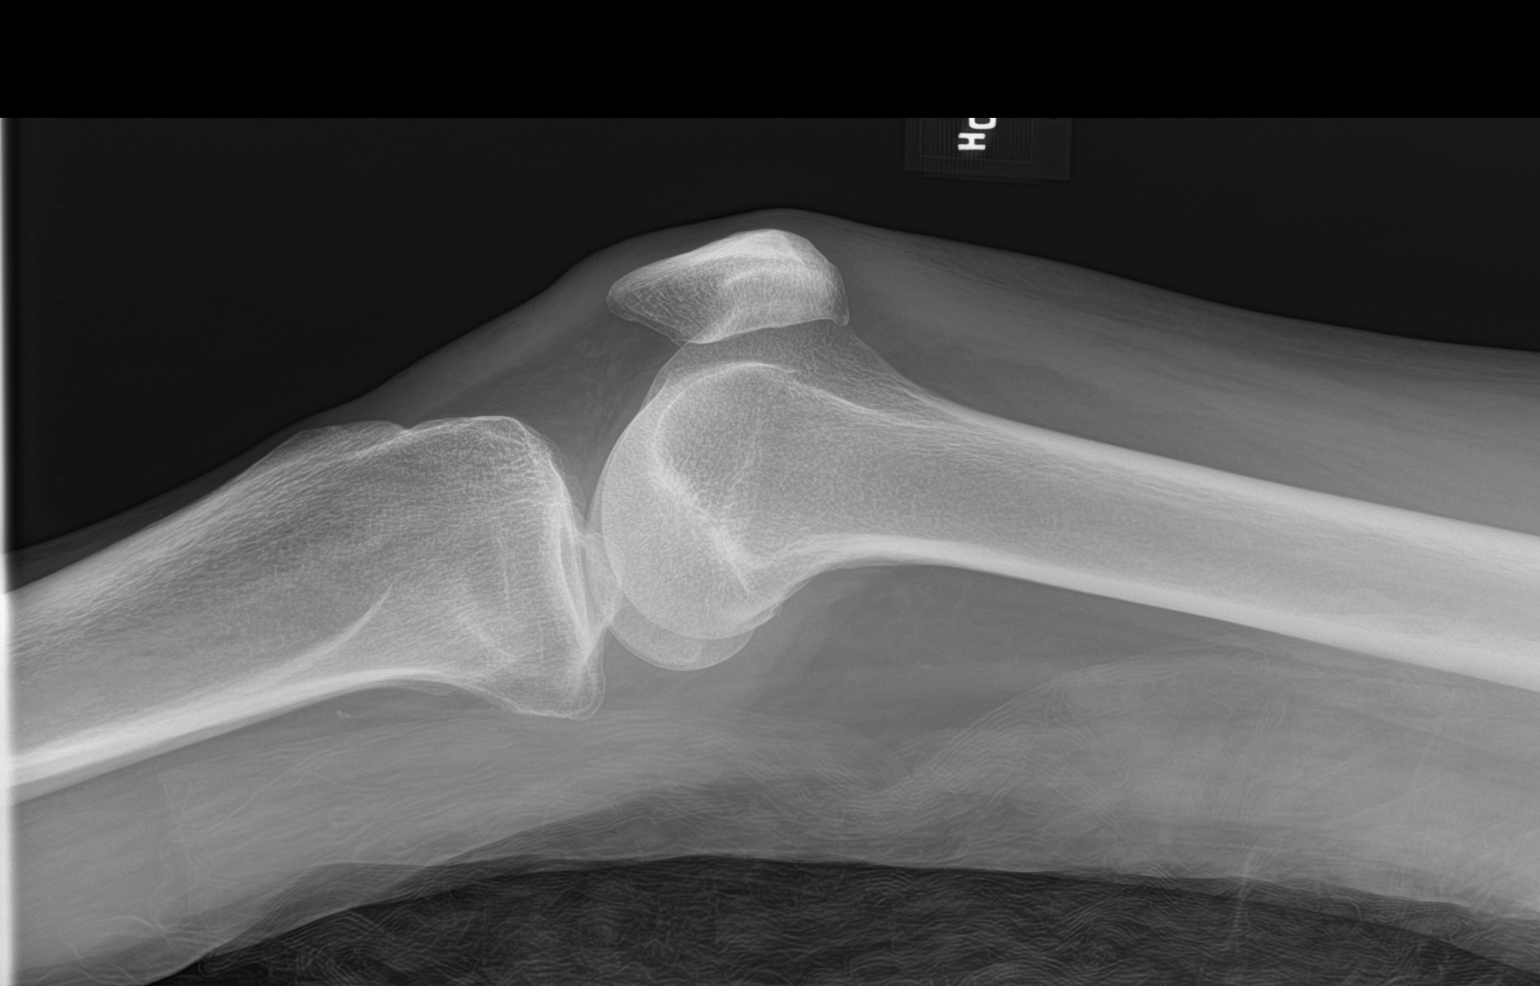

[2 of 2 positions shown; findings below may reference images not displayed]

FINDINGS: No evidence of fracture, dislocation, or joint effusion. No evidence
of arthropathy or other focal bone abnormality. Soft tissues are
unremarkable.
IMPRESSION: Negative.

## 2022-12-17 DIAGNOSIS — H2511 Age-related nuclear cataract, right eye: Secondary | ICD-10-CM | POA: Diagnosis not present

## 2022-12-17 DIAGNOSIS — E119 Type 2 diabetes mellitus without complications: Secondary | ICD-10-CM | POA: Diagnosis not present

## 2022-12-17 DIAGNOSIS — H2512 Age-related nuclear cataract, left eye: Secondary | ICD-10-CM | POA: Diagnosis not present

## 2022-12-18 ENCOUNTER — Encounter: Payer: Self-pay | Admitting: Internal Medicine

## 2022-12-24 ENCOUNTER — Other Ambulatory Visit: Payer: Self-pay | Admitting: Internal Medicine

## 2022-12-28 ENCOUNTER — Other Ambulatory Visit: Payer: 59

## 2022-12-28 DIAGNOSIS — D508 Other iron deficiency anemias: Secondary | ICD-10-CM | POA: Diagnosis not present

## 2022-12-28 DIAGNOSIS — E861 Hypovolemia: Secondary | ICD-10-CM | POA: Diagnosis not present

## 2022-12-28 DIAGNOSIS — E782 Mixed hyperlipidemia: Secondary | ICD-10-CM

## 2022-12-28 DIAGNOSIS — E119 Type 2 diabetes mellitus without complications: Secondary | ICD-10-CM | POA: Diagnosis not present

## 2022-12-29 LAB — CMP14+EGFR
ALT: 64 IU/L — ABNORMAL HIGH (ref 0–44)
AST: 43 IU/L — ABNORMAL HIGH (ref 0–40)
Albumin/Globulin Ratio: 2.1 (ref 1.2–2.2)
Albumin: 3.9 g/dL (ref 3.9–4.9)
Alkaline Phosphatase: 80 IU/L (ref 44–121)
BUN/Creatinine Ratio: 16 (ref 10–24)
BUN: 14 mg/dL (ref 8–27)
Bilirubin Total: 0.3 mg/dL (ref 0.0–1.2)
CO2: 25 mmol/L (ref 20–29)
Calcium: 9.1 mg/dL (ref 8.6–10.2)
Chloride: 105 mmol/L (ref 96–106)
Creatinine, Ser: 0.9 mg/dL (ref 0.76–1.27)
Globulin, Total: 1.9 g/dL (ref 1.5–4.5)
Glucose: 103 mg/dL — ABNORMAL HIGH (ref 70–99)
Potassium: 3.5 mmol/L (ref 3.5–5.2)
Sodium: 145 mmol/L — ABNORMAL HIGH (ref 134–144)
Total Protein: 5.8 g/dL — ABNORMAL LOW (ref 6.0–8.5)
eGFR: 97 mL/min/{1.73_m2} (ref >59)

## 2022-12-29 LAB — CBC WITH DIFFERENTIAL
Basophils Absolute: 0 10*3/uL (ref 0.0–0.2)
Basos: 1 %
EOS (ABSOLUTE): 0.1 10*3/uL (ref 0.0–0.4)
Eos: 3 %
Hematocrit: 38.9 % (ref 37.5–51.0)
Hemoglobin: 12.7 g/dL — ABNORMAL LOW (ref 13.0–17.7)
Immature Grans (Abs): 0 10*3/uL (ref 0.0–0.1)
Immature Granulocytes: 0 %
Lymphocytes Absolute: 2 10*3/uL (ref 0.7–3.1)
Lymphs: 43 %
MCH: 29.8 pg (ref 26.6–33.0)
MCHC: 32.6 g/dL (ref 31.5–35.7)
MCV: 91 fL (ref 79–97)
Monocytes Absolute: 0.4 10*3/uL (ref 0.1–0.9)
Monocytes: 8 %
Neutrophils Absolute: 2.2 10*3/uL (ref 1.4–7.0)
Neutrophils: 45 %
RBC: 4.26 x10E6/uL (ref 4.14–5.80)
RDW: 12.8 % (ref 11.6–15.4)
WBC: 4.8 10*3/uL (ref 3.4–10.8)

## 2022-12-29 LAB — LIPID PANEL
Chol/HDL Ratio: 2.4 ratio (ref 0.0–5.0)
Cholesterol, Total: 84 mg/dL — ABNORMAL LOW (ref 100–199)
HDL: 35 mg/dL — ABNORMAL LOW (ref >39)
LDL Chol Calc (NIH): 32 mg/dL (ref 0–99)
Triglycerides: 82 mg/dL (ref 0–149)
VLDL Cholesterol Cal: 17 mg/dL (ref 5–40)

## 2022-12-29 LAB — HEMOGLOBIN A1C
Est. average glucose Bld gHb Est-mCnc: 123 mg/dL
Hgb A1c MFr Bld: 5.9 % — ABNORMAL HIGH (ref 4.8–5.6)

## 2022-12-29 LAB — TSH: TSH: 2.56 u[IU]/mL (ref 0.450–4.500)

## 2022-12-31 ENCOUNTER — Other Ambulatory Visit: Payer: Self-pay | Admitting: Internal Medicine

## 2022-12-31 DIAGNOSIS — E119 Type 2 diabetes mellitus without complications: Secondary | ICD-10-CM

## 2023-01-02 ENCOUNTER — Encounter: Payer: Self-pay | Admitting: Internal Medicine

## 2023-01-02 ENCOUNTER — Ambulatory Visit (INDEPENDENT_AMBULATORY_CARE_PROVIDER_SITE_OTHER): Payer: 59 | Admitting: Internal Medicine

## 2023-01-02 VITALS — BP 120/70 | HR 71 | Ht 72.0 in | Wt 133.2 lb

## 2023-01-02 DIAGNOSIS — E119 Type 2 diabetes mellitus without complications: Secondary | ICD-10-CM

## 2023-01-02 DIAGNOSIS — I2510 Atherosclerotic heart disease of native coronary artery without angina pectoris: Secondary | ICD-10-CM | POA: Diagnosis not present

## 2023-01-02 DIAGNOSIS — E870 Hyperosmolality and hypernatremia: Secondary | ICD-10-CM | POA: Diagnosis not present

## 2023-01-02 DIAGNOSIS — E782 Mixed hyperlipidemia: Secondary | ICD-10-CM

## 2023-01-02 LAB — GLUCOSE, POCT (MANUAL RESULT ENTRY): POC Glucose: 94 mg/dl (ref 70–99)

## 2023-01-02 MED ORDER — ROSUVASTATIN CALCIUM 40 MG PO TABS
40.0000 mg | ORAL_TABLET | Freq: Every evening | ORAL | 5 refills | Status: DC
Start: 2023-01-02 — End: 2023-08-21

## 2023-01-02 MED ORDER — METFORMIN HCL 500 MG PO TABS: 500.0000 mg | ORAL_TABLET | Freq: Two times a day (BID) | ORAL | 0 refills | Status: DC

## 2023-01-02 MED ORDER — EMPAGLIFLOZIN 25 MG PO TABS
25.0000 mg | ORAL_TABLET | Freq: Every morning | ORAL | 1 refills | Status: DC
Start: 2023-01-02 — End: 2023-08-21

## 2023-01-02 MED ORDER — ONE-DAILY MULTI VITAMINS PO TABS: 1.0000 | ORAL_TABLET | Freq: Every day | ORAL | 11 refills | Status: DC

## 2023-01-02 MED ORDER — PANTOPRAZOLE SODIUM 40 MG PO TBEC: 40.0000 mg | DELAYED_RELEASE_TABLET | Freq: Every day | ORAL | 11 refills | Status: DC

## 2023-01-02 NOTE — Progress Notes (Signed)
Established Patient Office Visit  Subjective:  Patient ID: Michael Wall, male    DOB: 1961/03/01  Age: 62 y.o. MRN: [id number]  Chief Complaint  Patient presents with   Follow-up    3 months with labs prior    No new complaints, here for lab review and medication refills. Labs reviewed and notable for well controlled diabetes, A1c at target. Denies any hypoglycemic episodes and home bg readings have been at target. CMP notable for improvement in sodium but still slightly elevated after increasing his fluid consumption over the past 3 months. C/o excessive weight loss on Jardiance.      No other concerns at this time.   Past Medical History:  Diagnosis Date   Arthritis    Coronary artery disease    Diabetes (HCC)    Dyspnea    GERD (gastroesophageal reflux disease)    HBP (high blood pressure)    Myocardial infarction (HCC)    Schizophrenia (HCC)     Past Surgical History:  Procedure Laterality Date   CARDIAC CATHETERIZATION     COLONOSCOPY WITH PROPOFOL N/A 05/27/2017   Procedure: COLONOSCOPY WITH PROPOFOL;  Surgeon: Scot Jun, MD;  Location: [facility] ENDOSCOPY;  Service: Endoscopy;  Laterality: N/A;   COLONOSCOPY WITH PROPOFOL N/A 10/21/2017   Procedure: COLONOSCOPY WITH PROPOFOL;  Surgeon: Scot Jun, MD;  Location: [facility] ENDOSCOPY;  Service: Endoscopy;  Laterality: N/A;   CORONARY ANGIOPLASTY     FINGER AMPUTATION Left    LEFT HEART CATH AND CORONARY ANGIOGRAPHY Left 01/02/2019   Procedure: LEFT HEART CATH AND CORONARY ANGIOGRAPHY;  Surgeon: Laurier Nancy, MD;  Location: [facility] INVASIVE CV LAB;  Service: Cardiovascular;  Laterality: Left;    Social History   Socioeconomic History   Marital status: Single    Spouse name: Not on file   Number of children: Not on file   Years of education: Not on file   Highest education level: Not on file  Occupational History   Not on file  Tobacco Use   Smoking status: Former    Packs/day: 2.00    Years:  20.00    Additional pack years: 0.00    Total pack years: 40.00    Types: Cigarettes   Smokeless tobacco: Never   Tobacco comments:    quit 1946  Vaping Use   Vaping Use: Never used  Substance and Sexual Activity   Alcohol use: No   Drug use: Not Currently    Types: Marijuana   Sexual activity: Not on file  Other Topics Concern   Not on file  Social History Narrative   Not on file   Social Determinants of Health   Financial Resource Strain: Not on file  Food Insecurity: Not on file  Transportation Needs: Not on file  Physical Activity: Not on file  Stress: Not on file  Social Connections: Not on file  Intimate Partner Violence: Not on file    Family History  Problem Relation Age of Onset   Heart disease Mother    Cancer Father    Stroke Father    Heart disease Father     No Known Allergies  Review of Systems  Constitutional:  Positive for weight loss.       8 lbs  HENT: Negative.    Eyes: Negative.   Respiratory: Negative.    Cardiovascular: Negative.  Negative for chest pain and palpitations.  Gastrointestinal: Negative.   Genitourinary: Negative.   Musculoskeletal: Negative.   Skin: Negative.  Neurological: Negative.   Endo/Heme/Allergies: Negative.        Objective:   BP 120/70   Pulse 71   Ht 6' (1.829 m)   Wt 133 lb 3.2 oz (60.4 kg)   SpO2 100%   BMI 18.07 kg/m   Vitals:   01/02/23 0926  BP: 120/70  Pulse: 71  Height: 6' (1.829 m)  Weight: 133 lb 3.2 oz (60.4 kg)  SpO2: 100%  BMI (Calculated): 18.06    Physical Exam Vitals reviewed.  Constitutional:      Appearance: Normal appearance.  HENT:     Head: Normocephalic.     Left Ear: There is no impacted cerumen.     Nose: Nose normal.     Mouth/Throat:     Mouth: Mucous membranes are moist.     Pharynx: No posterior oropharyngeal erythema.  Eyes:     Extraocular Movements: Extraocular movements intact.     Pupils: Pupils are equal, round, and reactive to light.   Cardiovascular:     Rate and Rhythm: Regular rhythm.     Chest Wall: PMI is not displaced.     Pulses: Normal pulses.     Heart sounds: Normal heart sounds. No murmur heard. Pulmonary:     Effort: Pulmonary effort is normal.     Breath sounds: Normal air entry. No rhonchi or rales.  Abdominal:     General: Abdomen is flat. Bowel sounds are normal. There is no distension.     Palpations: Abdomen is soft. There is no hepatomegaly, splenomegaly or mass.     Tenderness: There is no abdominal tenderness.  Musculoskeletal:        General: Normal range of motion.     Cervical back: Normal range of motion and neck supple.     Right lower leg: No edema.     Left lower leg: No edema.  Skin:    General: Skin is warm and dry.  Neurological:     General: No focal deficit present.     Mental Status: He is alert and oriented to person, place, and time.     Cranial Nerves: No cranial nerve deficit.     Motor: No weakness.  Psychiatric:        Mood and Affect: Mood normal.        Behavior: Behavior normal.      Results for orders placed or performed in visit on 01/02/23  POCT Glucose (CBG)  Result Value Ref Range   POC Glucose 94 70 - 99 mg/dl    Recent Results (from the past 2160 hour([employer]))  Hemoglobin A1c     Status: Abnormal   Collection Time: 12/28/22  8:57 AM  Result Value Ref Range   Hgb A1c MFr Bld 5.9 (H) 4.8 - 5.6 %    Comment:          Prediabetes: 5.7 - 6.4          Diabetes: >6.4          Glycemic control for adults with diabetes: <7.0    Est. average glucose Bld gHb Est-mCnc 123 mg/dL  TSH     Status: None   Collection Time: 12/28/22  8:57 AM  Result Value Ref Range   TSH 2.560 0.450 - 4.500 uIU/mL  CMP14+EGFR     Status: Abnormal   Collection Time: 12/28/22  8:57 AM  Result Value Ref Range   Glucose 103 (H) 70 - 99 mg/dL   BUN 14 8 - 27 mg/dL  Creatinine, Ser 0.90 0.76 - 1.27 mg/dL   eGFR 97 >40 JW/JXB/1.47   BUN/Creatinine Ratio 16 10 - 24   Sodium 145 (H)  134 - 144 mmol/L   Potassium 3.5 3.5 - 5.2 mmol/L   Chloride 105 96 - 106 mmol/L   CO2 25 20 - 29 mmol/L   Calcium 9.1 8.6 - 10.2 mg/dL   Total Protein 5.8 (L) 6.0 - 8.5 g/dL   Albumin 3.9 3.9 - 4.9 g/dL   Globulin, Total 1.9 1.5 - 4.5 g/dL   Albumin/Globulin Ratio 2.1 1.2 - 2.2   Bilirubin Total 0.3 0.0 - 1.2 mg/dL   Alkaline Phosphatase 80 44 - 121 IU/L   AST 43 (H) 0 - 40 IU/L   ALT 64 (H) 0 - 44 IU/L  Lipid panel     Status: Abnormal   Collection Time: 12/28/22  8:57 AM  Result Value Ref Range   Cholesterol, Total 84 (L) 100 - 199 mg/dL   Triglycerides 82 0 - 149 mg/dL   HDL 35 (L) >82 mg/dL   VLDL Cholesterol Cal 17 5 - 40 mg/dL   LDL Chol Calc (NIH) 32 0 - 99 mg/dL   Chol/HDL Ratio 2.4 0.0 - 5.0 ratio    Comment:                                   T. Chol/HDL Ratio                                             Men  Women                               1/2 Avg.Risk  3.4    3.3                                   Avg.Risk  5.0    4.4                                2X Avg.Risk  9.6    7.1                                3X Avg.Risk 23.4   11.0   CBC With Differential     Status: Abnormal   Collection Time: 12/28/22  8:57 AM  Result Value Ref Range   WBC 4.8 3.4 - 10.8 x10E3/uL   RBC 4.26 4.14 - 5.80 x10E6/uL   Hemoglobin 12.7 (L) 13.0 - 17.7 g/dL   Hematocrit 95.6 21.3 - 51.0 %   MCV 91 79 - 97 fL   MCH 29.8 26.6 - 33.0 pg   MCHC 32.6 31.5 - 35.7 g/dL   RDW 08.6 57.8 - 46.9 %   Neutrophils 45 Not Estab. %   Lymphs 43 Not Estab. %   Monocytes 8 Not Estab. %   Eos 3 Not Estab. %   Basos 1 Not Estab. %   Neutrophils Absolute 2.2 1.4 - 7.0 x10E3/uL   Lymphocytes Absolute 2.0 0.7 - 3.1 x10E3/uL   Monocytes Absolute  0.4 0.1 - 0.9 x10E3/uL   EOS (ABSOLUTE) 0.1 0.0 - 0.4 x10E3/uL   Basophils Absolute 0.0 0.0 - 0.2 x10E3/uL   Immature Granulocytes 0 Not Estab. %   Immature Grans (Abs) 0.0 0.0 - 0.1 x10E3/uL  POCT Glucose (CBG)     Status: Normal   Collection Time: 01/02/23   9:33 AM  Result Value Ref Range   POC Glucose 94 70 - 99 mg/dl      Assessment & Plan:   Problem List Items Addressed This Visit       Endocrine   Diabetes (HCC) - Primary   Relevant Orders   POCT Glucose (CBG) (Completed)    No follow-ups on file.   Total time spent: 20 minutes  Luna Fuse, MD  01/02/2023   This document may have been prepared by [facility] Voice Recognition software and as such may include unintentional dictation errors.

## 2023-01-10 ENCOUNTER — Encounter: Payer: Self-pay | Admitting: Cardiovascular Disease

## 2023-01-10 ENCOUNTER — Ambulatory Visit (INDEPENDENT_AMBULATORY_CARE_PROVIDER_SITE_OTHER): Payer: 59 | Admitting: Cardiovascular Disease

## 2023-01-10 VITALS — BP 136/84 | HR 84 | Ht 72.0 in | Wt 133.2 lb

## 2023-01-10 DIAGNOSIS — R0600 Dyspnea, unspecified: Secondary | ICD-10-CM | POA: Diagnosis not present

## 2023-01-10 DIAGNOSIS — I259 Chronic ischemic heart disease, unspecified: Secondary | ICD-10-CM

## 2023-01-10 DIAGNOSIS — I10 Essential (primary) hypertension: Secondary | ICD-10-CM | POA: Diagnosis not present

## 2023-01-10 DIAGNOSIS — E782 Mixed hyperlipidemia: Secondary | ICD-10-CM | POA: Diagnosis not present

## 2023-01-10 DIAGNOSIS — I2510 Atherosclerotic heart disease of native coronary artery without angina pectoris: Secondary | ICD-10-CM | POA: Diagnosis not present

## 2023-01-10 DIAGNOSIS — E119 Type 2 diabetes mellitus without complications: Secondary | ICD-10-CM

## 2023-01-10 MED ORDER — LOSARTAN POTASSIUM 50 MG PO TABS
50.0000 mg | ORAL_TABLET | Freq: Every day | ORAL | 2 refills | Status: DC
Start: 2023-01-10 — End: 2023-09-23

## 2023-01-10 MED ORDER — METOPROLOL TARTRATE 50 MG PO TABS: ORAL_TABLET | ORAL | 0 refills | Status: DC

## 2023-01-10 NOTE — Progress Notes (Signed)
Cardiology Office Note   Date:  01/10/2023   ID:  MAN MOM, DOB Feb 04, 1961, MRN [id number]  PCP:  Sherron Monday, MD  Cardiologist:  Adrian Blackwater, MD      History of Present Illness: Michael Wall is a 62 y.o. male who presents for  Chief Complaint  Patient presents with   Follow-up    5 month follow up    Feeling fine      Past Medical History:  Diagnosis Date   Arthritis    Coronary artery disease    Diabetes (HCC)    Dyspnea    GERD (gastroesophageal reflux disease)    HBP (high blood pressure)    Myocardial infarction (HCC)    Schizophrenia (HCC)      Past Surgical History:  Procedure Laterality Date   CARDIAC CATHETERIZATION     COLONOSCOPY WITH PROPOFOL N/A 05/27/2017   Procedure: COLONOSCOPY WITH PROPOFOL;  Surgeon: Scot Jun, MD;  Location: [facility] ENDOSCOPY;  Service: Endoscopy;  Laterality: N/A;   COLONOSCOPY WITH PROPOFOL N/A 10/21/2017   Procedure: COLONOSCOPY WITH PROPOFOL;  Surgeon: Scot Jun, MD;  Location: [facility] ENDOSCOPY;  Service: Endoscopy;  Laterality: N/A;   CORONARY ANGIOPLASTY     FINGER AMPUTATION Left    LEFT HEART CATH AND CORONARY ANGIOGRAPHY Left 01/02/2019   Procedure: LEFT HEART CATH AND CORONARY ANGIOGRAPHY;  Surgeon: Laurier Nancy, MD;  Location: [facility] INVASIVE CV LAB;  Service: Cardiovascular;  Laterality: Left;     Current Outpatient Medications  Medication Sig Dispense Refill   acetaminophen (TYLENOL) 325 MG tablet Take 2 tablets (650 mg total) by mouth every 8 (eight) hours as needed.     aspirin EC 81 MG tablet Take 81 mg by mouth daily.     benztropine (COGENTIN) 2 MG tablet Take 2 mg by mouth 2 (two) times daily.     clopidogrel (PLAVIX) 75 MG tablet TAKE 1 TABLET BY MOUTH DAILY 30 tablet 3   empagliflozin (JARDIANCE) 25 MG TABS tablet Take 1 tablet (25 mg total) by mouth every morning. 90 tablet 1   haloperidol (HALDOL) 10 MG tablet Take 10 mg by mouth 3 (three) times daily.       losartan (COZAAR) 50 MG tablet Take 1 tablet (50 mg total) by mouth daily. 90 tablet 2   metFORMIN (GLUCOPHAGE) 500 MG tablet Take 1 tablet (500 mg total) by mouth 2 (two) times daily. 180 tablet 0   metoprolol succinate (TOPROL-XL) 100 MG 24 hr tablet Take 100 mg by mouth daily.     metoprolol tartrate (LOPRESSOR) 50 MG tablet Take 1 tab night before and 2tab 90 minutes prior to ccta 3 tablet 0   mirtazapine (REMERON) 45 MG tablet Take 45 mg by mouth at bedtime.      Multiple Vitamin (MULTIVITAMIN) tablet Take 1 tablet by mouth daily. 30 tablet 11   ONETOUCH ULTRA test strip 1 each by Other route 2 (two) times daily.     pantoprazole (PROTONIX) 40 MG tablet Take 1 tablet (40 mg total) by mouth daily. 30 tablet 11   QUEtiapine (SEROQUEL) 100 MG tablet Take 150 mg by mouth at bedtime.     rosuvastatin (CRESTOR) 40 MG tablet Take 1 tablet (40 mg total) by mouth every evening. 30 tablet 5   No current facility-administered medications for this visit.   Facility-Administered Medications Ordered in Other Visits  Medication Dose Route Frequency Provider Last Rate Last Admin   sodium chloride flush (NS) 0.9 %  injection 3 mL  3 mL Intravenous Q12H Caroleen Hamman, FNP        Allergies:   Patient has no known allergies.    Social History:   reports that he has quit smoking. His smoking use included cigarettes. He has a 40.00 pack-year smoking history. He has never used smokeless tobacco. He reports that he does not currently use drugs after having used the following drugs: Marijuana. He reports that he does not drink alcohol.   Family History:  family history includes Cancer in his father; Heart disease in his father and mother; Stroke in his father.    ROS:     Review of Systems  Constitutional: Negative.   HENT: Negative.    Eyes: Negative.   Respiratory: Negative.    Gastrointestinal: Negative.   Genitourinary: Negative.   Musculoskeletal: Negative.   Skin: Negative.   Neurological:  Negative.   Endo/Heme/Allergies: Negative.   Psychiatric/Behavioral: Negative.    All other systems reviewed and are negative.     All other systems are reviewed and negative.    PHYSICAL EXAM: VS:  BP 136/84   Pulse 84   Ht 6' (1.829 m)   Wt 133 lb 3.2 oz (60.4 kg)   SpO2 100%   BMI 18.07 kg/m  , BMI Body mass index is 18.07 kg/m. Last weight:  Wt Readings from Last 3 Encounters:  01/10/23 133 lb 3.2 oz (60.4 kg)  01/02/23 133 lb 3.2 oz (60.4 kg)  10/03/22 141 lb 3.2 oz (64 kg)     Physical Exam Vitals reviewed.  Constitutional:      Appearance: Normal appearance. He is normal weight.  HENT:     Head: Normocephalic.     Nose: Nose normal.     Mouth/Throat:     Mouth: Mucous membranes are moist.  Eyes:     Pupils: Pupils are equal, round, and reactive to light.  Cardiovascular:     Rate and Rhythm: Normal rate and regular rhythm.     Pulses: Normal pulses.     Heart sounds: Normal heart sounds.  Pulmonary:     Effort: Pulmonary effort is normal.  Abdominal:     General: Abdomen is flat. Bowel sounds are normal.  Musculoskeletal:        General: Normal range of motion.     Cervical back: Normal range of motion.  Skin:    General: Skin is warm.  Neurological:     General: No focal deficit present.     Mental Status: He is alert.  Psychiatric:        Mood and Affect: Mood normal.      REASON FOR VISIT  Visit for: Echocardiogram/R06.02  Sex:   Male  wt=200    lbs.  BP=142/80  Height= 64   inches.        TESTS  Imaging: Echocardiogram:  An echocardiogram in (2-d) mode was performed and in Doppler mode with color flow velocity mapping was performed. The aortic valve cusps are abnormal 1.8  cm, flow velocity 1.03    m/s, and systolic calculated mean flow gradient 2  mmHg. Mitral valve diastolic peak flow velocity E 1.61     m/s and E/A ratio 1.1. Aortic root diameter 3.4   cm. The LVOT internal diameter 2.3 cm and flow velocity was abnormal .902   m/s. LV systolic dimension 2.36 cm, diastolic 4.42  cm, posterior wall thickness 1.2   cm, fractional shortening 46.6  %, and EF 78.2 %. IVS  thickness 0.877  cm. LA dimension 5.0 cm. Mitral Valve has Mild Regurgitation. Aortic Valve has Mild Regurgitation. Tricuspid Valve has Trace Regurgitation.     ASSESSMENT  Technically difficult study due to body habitus.  Normal chamber sizes.  Mild left ventricular hypertrophy with GRADE 1 (relaxation abnormality) diastolic dysfunction.  Normal right ventricular systolic function.  Normal right ventricular diastolic function.  Normal left ventricular wall motion.  Normal right ventricular wall motion.  Trace tricuspid regurgitation.  Normal pulmonary artery pressure.  Mild mitral regurgitation.  Mild aortic regurgitation.  No pericardial effusion.  Mildly dilated Left and Right atrium  Mild LVH.     THERAPY   Referring physician: Laurier Nancy  Sonographer: Adrian Blackwater.      Adrian Blackwater MD  Electronically signed by: Adrian Blackwater     Date: 09/03/2022 14:07 REASON FOR VISIT  Visit for: Echocardiogram/R06.02  Sex:   Male          wt= 149   lbs.  BP=124/62  Height=  72  inches.        TESTS  Imaging: Echocardiogram:  An echocardiogram in (2-d) mode was performed and in Doppler mode with color flow velocity mapping was performed. The aortic valve cusps are abnormal 2.2   cm, flow velocity .841    m/s, and systolic calculated mean flow gradient 2   mmHg. Aortic root diameter 3.6  cm. The LVOT internal diameter 3.0 cm and flow velocity was abnormal .639  m/s. LV systolic dimension 1.75  cm, diastolic 3.56 cm, posterior wall thickness 1.16 cm, fractional shortening 50.8 %, and EF 82.9 %. IVS thickness 0.94 cm. LA dimension 4.1 cm. Mitral Valve has Mild Regurgitation. Pulmonic Valve has Trace Regurgitation. Tricuspid Valve has Trace Regurgitation.     ASSESSMENT  Technically adequate study.  Normal chamber sizes.   Normal left ventricular systolic function.  Mild left ventricular hypertrophy with GRADE 1 (relaxation abnormality) diastolic dysfunction.  Normal right ventricular systolic function.  Normal right ventricular diastolic function.  Normal left ventricular wall motion.  Normal right ventricular wall motion.  Trace pulmonary regurgitation.  Trace tricuspid regurgitation.  Normal pulmonary artery pressure.  Mild mitral regurgitation.  No pericardial effusion.  Mildly dilated Left atrium  Mild LVH.     THERAPY   Referring physician: Laurier Nancy  Sonographer: Adrian Blackwater.      Adrian Blackwater MD  Electronically signed by: Adrian Blackwater     Date: 08/09/2022 13:17  TESTS    [facility] [address] [place], [place] [address] [phone] STUDY:  Gated Stress / Rest Myocardial Perfusion Imaging Tomographic (SPECT) Including attenuation correction Wall Motion, Left Ventricular Ejection Fraction By Gated Technique.Persantine Stress Test. SEX: Male   WEIGHT: 150 lbs   HEIGHT: 71 in      ARMS UP: YES/NO  REFERRING PHYSICIAN: Dr.Brynlynn Walko Wel[place] Flakes                                                                                                                                                                                                                       INDICATION FOR STUDY: SOB                                                                                                                                                                                                                    TECHNIQUE:  Approximately 20 minutes following the intravenous administration of 9.6 mCi of Tc-11m Sestamibi after stress testing in a reclined supine position with arms above their head if able to do so, gated SPECT  imaging of the heart was performed. After about a 2hr break, the patient was injected intravenously with 32.3 mCi of Tc-56m Sestamibi.  Approximately 45 minutes later in the same position as stress imaging SPECT rest imaging of the heart was performed.  STRESS BY:  Adrian Blackwater, MD PROTOCOL:  Persantine   DOSE ADMIN:  7.8 CC    ROUTE OF ADMINISTRATION: IV  MAX PRED HR: 158                     85%: 134               75%: 119                                                                                                                   RESTING BP: 102/50  RESTING HR: 97  PEAK BP:  90/52  PEAK HR: 105                                                                    EXERCISE DURATION:    4 min injection                                            REASON FOR TEST TERMINATION:    Protocol end                                                                                                                              SYMPTOMS:   None  EKG RESULTS:  NSR. 98/min. RBBB. No significant ST changes with persantine.                                                             IMAGE QUALITY:  Good                                                                                                                                                                                                                                                                                                                                 PERFUSION/WALL MOTION FINDINGS:  EF = 75%. Large severe fixed with partial reversibility basal, mid and apical anterior wall defects, normal wall motion.                                                                         IMPRESSION:   Infarction in the LAD territory with normal LVEF.  Adrian Blackwater, MD Stress Interpreting Physician / Nuclear Interpreting Physician                Adrian Blackwater MD  Electronically signed by: Adrian Blackwater     Date: 08/03/2022 10:23 EKG:   Recent Labs: 09/27/2022: Platelets 194 12/28/2022: ALT 64; BUN 14; Creatinine, Ser 0.90; Hemoglobin 12.7; Potassium 3.5; Sodium 145; TSH 2.560    Lipid Panel    Component Value Date/Time   CHOL 84 (L) 12/28/2022 0857   TRIG 82 12/28/2022 0857   HDL 35 (L) 12/28/2022 0857   CHOLHDL 2.4 12/28/2022 0857   LDLCALC 32 12/28/2022 0857      Other studies Reviewed: Additional studies/ records that were reviewed today include:  Review of the above records demonstrates:       No data to display            ASSESSMENT AND PLAN:    ICD-10-CM   1. Coronary artery disease involving native heart without angina pectoris, unspecified vessel or lesion type  I25.10 CT CORONARY MORPH W/CTA COR W/SCORE W/CA W/CM &/OR WO/CM   advise ccta as stress test abnormal    2. Two-vessel coronary artery disease  I25.10 CT CORONARY MORPH W/CTA COR W/SCORE W/CA W/CM &/OR WO/CM    3. Type 2 diabetes mellitus without complication, unspecified whether long term insulin use (HCC)  E11.9 CT CORONARY MORPH W/CTA COR W/SCORE W/CA W/CM &/OR WO/CM    4. Dyspnea, unspecified type  R06.00 CT CORONARY MORPH W/CTA COR W/SCORE W/CA W/CM &/OR WO/CM    5. Mixed hyperlipidemia  E78.2 CT CORONARY MORPH W/CTA COR W/SCORE W/CA W/CM &/OR WO/CM    6. Primary hypertension  I10 CT CORONARY MORPH W/CTA COR W/SCORE W/CA W/CM &/OR WO/CM   increase losartan dosage to 50 mg as BP elevated    7. Chest pain due to  myocardial ischemia, unspecified ischemic chest pain type  I25.9 CT CORONARY MORPH W/CTA COR W/SCORE W/CA W/CM &/OR WO/CM       Problem List Items Addressed This Visit       Cardiovascular and Mediastinum   Coronary artery disease - Primary   Relevant Medications   losartan (COZAAR) 50 MG tablet   metoprolol tartrate (LOPRESSOR) 50 MG tablet   Other Relevant Orders   CT CORONARY MORPH W/CTA COR W/SCORE W/CA W/CM &/OR WO/CM   Two-vessel coronary artery disease   Relevant Medications   losartan (COZAAR) 50 MG tablet   metoprolol tartrate (LOPRESSOR) 50 MG tablet   Other Relevant Orders   CT CORONARY MORPH W/CTA COR W/SCORE W/CA W/CM &/OR WO/CM     Endocrine   Diabetes (HCC)   Relevant Medications   losartan (COZAAR) 50 MG tablet   Other Relevant Orders   CT CORONARY MORPH W/CTA COR W/SCORE W/CA W/CM &/OR WO/CM     Other   Dyspnea   Relevant Orders   CT CORONARY MORPH W/CTA COR W/SCORE W/CA W/CM &/OR WO/CM   Mixed hyperlipidemia   Relevant Medications   losartan (COZAAR) 50 MG tablet   metoprolol tartrate (LOPRESSOR) 50 MG tablet   Other Relevant Orders   CT CORONARY MORPH W/CTA COR W/SCORE W/CA W/CM &/OR WO/CM   Other Visit Diagnoses     Primary hypertension       increase losartan dosage to 50 mg as BP elevated   Relevant Medications   losartan (COZAAR) 50 MG tablet   metoprolol tartrate (LOPRESSOR) 50 MG tablet   Other Relevant Orders   CT CORONARY  MORPH W/CTA COR W/SCORE W/CA W/CM &/OR WO/CM   Chest pain due to myocardial ischemia, unspecified ischemic chest pain type       Relevant Orders   CT CORONARY MORPH W/CTA COR W/SCORE W/CA W/CM &/OR WO/CM          Disposition:   Return in about 2 weeks (around 01/24/2023) for after ccta.    Total time spent: 30 minutes  Signed,  Adrian Blackwater, MD  01/10/2023 9:41 AM    [facility]

## 2023-01-14 ENCOUNTER — Ambulatory Visit (INDEPENDENT_AMBULATORY_CARE_PROVIDER_SITE_OTHER): Payer: 59 | Admitting: Podiatry

## 2023-01-14 DIAGNOSIS — M2041 Other hammer toe(s) (acquired), right foot: Secondary | ICD-10-CM | POA: Diagnosis not present

## 2023-01-14 DIAGNOSIS — M79609 Pain in unspecified limb: Secondary | ICD-10-CM | POA: Diagnosis not present

## 2023-01-14 DIAGNOSIS — B351 Tinea unguium: Secondary | ICD-10-CM

## 2023-01-14 DIAGNOSIS — M2042 Other hammer toe(s) (acquired), left foot: Secondary | ICD-10-CM

## 2023-01-14 DIAGNOSIS — E119 Type 2 diabetes mellitus without complications: Secondary | ICD-10-CM

## 2023-01-16 ENCOUNTER — Encounter: Payer: Self-pay | Admitting: Podiatry

## 2023-01-16 NOTE — Progress Notes (Signed)
ANNUAL DIABETIC FOOT EXAM  Subjective: Michael Wall presents today annual diabetic foot exam.  Chief Complaint  Patient presents with   Nail Problem    DFC,Referring Provider Sherron Monday, MD,LOV:05/24,A1C:5.9,BS today :95 per patient      Patient confirms h/o diabetes.  Patient denies any h/o foot wounds.  Patient denies any numbness, tingling, burning, or pins/needle sensation in feet.  Risk factors: diabetes, h/o MI, CAD, hyperlipidemia, h/o tobacco use in remission.  Sherron Monday, MD is patient's PCP.  Past Medical History:  Diagnosis Date   Arthritis    Coronary artery disease    Diabetes (HCC)    Dyspnea    GERD (gastroesophageal reflux disease)    HBP (high blood pressure)    Myocardial infarction (HCC)    Schizophrenia [facility])    Patient Active Problem List   Diagnosis Date Noted   Mixed hyperlipidemia 01/02/2023   Elevated liver enzymes 08/20/2020   Protein-calorie malnutrition, severe 08/19/2020   Acute UTI 08/19/2020   AKI (acute kidney injury) (HCC) 08/19/2020   UTI (urinary tract infection) 08/18/2020   Hypotension 08/18/2020   Unstable angina (HCC) 12/25/2018   Two-vessel coronary artery disease 12/25/2018   Coronary artery disease 12/03/2017   Diabetes (HCC) 12/03/2017   Ischemic colitis (HCC) 12/03/2017   Dyspnea 02/25/2017   Acute myocardial infarction (HCC) 02/25/2017   Esophageal reflux disease 02/25/2017   History of type 2 diabetes mellitus 02/25/2017   Hx of coronary artery disease 02/25/2017   Past Surgical History:  Procedure Laterality Date   CARDIAC CATHETERIZATION     COLONOSCOPY WITH PROPOFOL N/A 05/27/2017   Procedure: COLONOSCOPY WITH PROPOFOL;  Surgeon: Scot Jun, MD;  Location: [facility] ENDOSCOPY;  Service: Endoscopy;  Laterality: N/A;   COLONOSCOPY WITH PROPOFOL N/A 10/21/2017   Procedure: COLONOSCOPY WITH PROPOFOL;  Surgeon: Scot Jun, MD;  Location: [facility] ENDOSCOPY;  Service: Endoscopy;   Laterality: N/A;   CORONARY ANGIOPLASTY     FINGER AMPUTATION Left    LEFT HEART CATH AND CORONARY ANGIOGRAPHY Left 01/02/2019   Procedure: LEFT HEART CATH AND CORONARY ANGIOGRAPHY;  Surgeon: Laurier Nancy, MD;  Location: [facility] INVASIVE CV LAB;  Service: Cardiovascular;  Laterality: Left;   Current Outpatient Medications on File Prior to Visit  Medication Sig Dispense Refill   acetaminophen (TYLENOL) 325 MG tablet Take 2 tablets (650 mg total) by mouth every 8 (eight) hours as needed.     aspirin EC 81 MG tablet Take 81 mg by mouth daily.     benztropine (COGENTIN) 2 MG tablet Take 2 mg by mouth 2 (two) times daily.     clopidogrel (PLAVIX) 75 MG tablet TAKE 1 TABLET BY MOUTH DAILY 30 tablet 3   empagliflozin (JARDIANCE) 25 MG TABS tablet Take 1 tablet (25 mg total) by mouth every morning. 90 tablet 1   haloperidol (HALDOL) 10 MG tablet Take 10 mg by mouth 3 (three) times daily.      losartan (COZAAR) 50 MG tablet Take 1 tablet (50 mg total) by mouth daily. 90 tablet 2   metFORMIN (GLUCOPHAGE) 500 MG tablet Take 1 tablet (500 mg total) by mouth 2 (two) times daily. 180 tablet 0   metoprolol succinate (TOPROL-XL) 100 MG 24 hr tablet Take 100 mg by mouth daily.     metoprolol tartrate (LOPRESSOR) 50 MG tablet Take 1 tab night before and 2tab 90 minutes prior to ccta 3 tablet 0   mirtazapine (REMERON) 45 MG tablet Take 45 mg by mouth at  bedtime.      Multiple Vitamin (MULTIVITAMIN) tablet Take 1 tablet by mouth daily. 30 tablet 11   ONETOUCH ULTRA test strip 1 each by Other route 2 (two) times daily.     pantoprazole (PROTONIX) 40 MG tablet Take 1 tablet (40 mg total) by mouth daily. 30 tablet 11   QUEtiapine (SEROQUEL) 100 MG tablet Take 150 mg by mouth at bedtime.     rosuvastatin (CRESTOR) 40 MG tablet Take 1 tablet (40 mg total) by mouth every evening. 30 tablet 5   Current Facility-Administered Medications on File Prior to Visit  Medication Dose Route Frequency Provider Last Rate Last  Admin   sodium chloride flush (NS) 0.9 % injection 3 mL  3 mL Intravenous Q12H Caroleen Hamman, FNP        No Known Allergies Social History   Occupational History   Not on file  Tobacco Use   Smoking status: Former    Packs/day: 2.00    Years: 20.00    Additional pack years: 0.00    Total pack years: 40.00    Types: Cigarettes   Smokeless tobacco: Never   Tobacco comments:    quit 1946  Vaping Use   Vaping Use: Never used  Substance and Sexual Activity   Alcohol use: No   Drug use: Not Currently    Types: Marijuana   Sexual activity: Not on file   Family History  Problem Relation Age of Onset   Heart disease Mother    Cancer Father    Stroke Father    Heart disease Father     There is no immunization history on file for this patient.   Review of Systems: Negative except as noted in the HPI.   Objective:  Michael Wall is a pleasant 62 y.o. male in NAD. AAO X 3. There were no vitals filed for this visit. Vascular Examination: Capillary refill time immediate b/l. Vascular status intact b/l with palpable pedal pulses. Pedal hair present b/l. No pain with calf compression b/l. Skin temperature gradient WNL b/l. No cyanosis or clubbing b/l. No ischemia or gangrene noted b/l.   Neurological Examination: Sensation grossly intact b/l with 10 gram monofilament. Vibratory sensation intact b/l.   Dermatological Examination: Pedal skin with normal turgor, texture and tone b/l.  No open wounds. No interdigital macerations.   Toenails 1-5 b/l thick, discolored, elongated with subungual debris and pain on dorsal palpation.   Area of blanchable erythema at dorsal PIPJ b/l 2nd digits secondary to hammertoe deformity. No hyperkeratotic nor porokeratotic lesions present on today's visit.  Musculoskeletal Examination: Muscle strength 5/5 to all lower extremity muscle groups bilaterally. Hammertoe(s) noted to the bilateral 2nd toes.  Radiographs: None  Last A1c:       Latest Ref Rng & Units 12/28/2022    8:57 AM 09/27/2022    9:00 AM  Hemoglobin A1C  Hemoglobin-A1c 4.8 - 5.6 % 5.9  6.1    ADA Risk Categorization: Low Risk :  Patient has all of the following: Intact protective sensation No prior foot ulcer  No severe deformity Pedal pulses present  Assessment: 1. Pain due to onychomycosis of nail   2. Acquired hammertoes of both feet   3. Diabetes mellitus without complication (HCC)   4. Encounter for diabetic foot exam [facility])     Plan: -Patient was evaluated and treated. All patient's and/or POA's questions/concerns answered on today's visit. -Recommended shoes with stretchable uppers to avoid pressure on hammertoes. -Diabetic foot examination performed today. -  Continue diabetic foot care principles: inspect feet daily, monitor glucose as recommended by PCP and/or Endocrinologist, and follow prescribed diet per PCP, Endocrinologist and/or dietician. -Patient to continue soft, supportive shoe gear daily. -Toenails 1-5 b/l were debrided in length and girth with sterile nail nippers and dremel without iatrogenic bleeding.  -Patient/POA to call should there be question/concern in the interim. Return in about 3 months (around 04/16/2023).  Freddie Breech, DPM

## 2023-01-21 ENCOUNTER — Ambulatory Visit (INDEPENDENT_AMBULATORY_CARE_PROVIDER_SITE_OTHER): Payer: 59

## 2023-01-21 ENCOUNTER — Other Ambulatory Visit: Payer: Self-pay | Admitting: Internal Medicine

## 2023-01-21 DIAGNOSIS — I259 Chronic ischemic heart disease, unspecified: Secondary | ICD-10-CM

## 2023-01-21 DIAGNOSIS — I2510 Atherosclerotic heart disease of native coronary artery without angina pectoris: Secondary | ICD-10-CM | POA: Diagnosis not present

## 2023-01-21 DIAGNOSIS — I10 Essential (primary) hypertension: Secondary | ICD-10-CM

## 2023-01-21 DIAGNOSIS — R0600 Dyspnea, unspecified: Secondary | ICD-10-CM

## 2023-01-21 DIAGNOSIS — E782 Mixed hyperlipidemia: Secondary | ICD-10-CM

## 2023-01-21 DIAGNOSIS — E119 Type 2 diabetes mellitus without complications: Secondary | ICD-10-CM

## 2023-01-21 MED ORDER — IOHEXOL 350 MG/ML SOLN
100.0000 mL | Freq: Once | INTRAVENOUS | Status: AC | PRN
Start: 2023-01-21 — End: 2023-01-21
  Administered 2023-01-21: 100 mL via INTRAVENOUS

## 2023-01-25 ENCOUNTER — Encounter: Payer: Self-pay | Admitting: Cardiovascular Disease

## 2023-01-25 ENCOUNTER — Ambulatory Visit (INDEPENDENT_AMBULATORY_CARE_PROVIDER_SITE_OTHER): Payer: 59 | Admitting: Cardiovascular Disease

## 2023-01-25 VITALS — BP 110/60 | HR 79 | Ht 72.0 in | Wt 135.0 lb

## 2023-01-25 DIAGNOSIS — E782 Mixed hyperlipidemia: Secondary | ICD-10-CM

## 2023-01-25 DIAGNOSIS — I2510 Atherosclerotic heart disease of native coronary artery without angina pectoris: Secondary | ICD-10-CM

## 2023-01-25 DIAGNOSIS — Z8639 Personal history of other endocrine, nutritional and metabolic disease: Secondary | ICD-10-CM

## 2023-01-25 DIAGNOSIS — R918 Other nonspecific abnormal finding of lung field: Secondary | ICD-10-CM | POA: Diagnosis not present

## 2023-01-25 DIAGNOSIS — R0600 Dyspnea, unspecified: Secondary | ICD-10-CM | POA: Diagnosis not present

## 2023-01-25 NOTE — Progress Notes (Signed)
Cardiology Office Note   Date:  01/25/2023   ID:  [name], DOB [birth year], MRN [id number]  PCP:  Michael Monday, MD  Cardiologist:  Michael Blackwater, MD      History of Present Illness: Michael Wall is a 62 y.o. male who presents for  Chief Complaint  Patient presents with   Follow-up    CCTA Results    No complaints      Past Medical History:  Diagnosis Date   Arthritis    Coronary artery disease    Diabetes (HCC)    Dyspnea    GERD (gastroesophageal reflux disease)    HBP (high blood pressure)    Myocardial infarction (HCC)    Schizophrenia (HCC)      Past Surgical History:  Procedure Laterality Date   CARDIAC CATHETERIZATION     COLONOSCOPY WITH PROPOFOL N/A 05/27/2017   Procedure: COLONOSCOPY WITH PROPOFOL;  Surgeon: Michael Jun, MD;  Location: [facility] ENDOSCOPY;  Service: Endoscopy;  Laterality: N/A;   COLONOSCOPY WITH PROPOFOL N/A 10/21/2017   Procedure: COLONOSCOPY WITH PROPOFOL;  Surgeon: Michael Jun, MD;  Location: [facility] ENDOSCOPY;  Service: Endoscopy;  Laterality: N/A;   CORONARY ANGIOPLASTY     FINGER AMPUTATION Left    LEFT HEART CATH AND CORONARY ANGIOGRAPHY Left 01/02/2019   Procedure: LEFT HEART CATH AND CORONARY ANGIOGRAPHY;  Surgeon: Michael Nancy, MD;  Location: [facility] INVASIVE CV LAB;  Service: Cardiovascular;  Laterality: Left;     Current Outpatient Medications  Medication Sig Dispense Refill   acetaminophen (TYLENOL) 325 MG tablet Take 2 tablets (650 mg total) by mouth every 8 (eight) hours as needed.     aspirin EC 81 MG tablet Take 81 mg by mouth daily.     benztropine (COGENTIN) 2 MG tablet Take 2 mg by mouth 2 (two) times daily.     clopidogrel (PLAVIX) 75 MG tablet TAKE 1 TABLET BY MOUTH DAILY 30 tablet 3   empagliflozin (JARDIANCE) 25 MG TABS tablet Take 1 tablet (25 mg total) by mouth every morning. 90 tablet 1   glucose blood (ONETOUCH ULTRA) test strip USE WITH DEVICE TO CHECK BLOOD SUGARS TWICE DAILY  100 each 3   haloperidol (HALDOL) 10 MG tablet Take 10 mg by mouth 3 (three) times daily.      losartan (COZAAR) 50 MG tablet Take 1 tablet (50 mg total) by mouth daily. 90 tablet 2   metFORMIN (GLUCOPHAGE) 500 MG tablet Take 1 tablet (500 mg total) by mouth 2 (two) times daily. 180 tablet 0   metoprolol succinate (TOPROL-XL) 100 MG 24 hr tablet Take 100 mg by mouth daily.     mirtazapine (REMERON) 45 MG tablet Take 45 mg by mouth at bedtime.      Multiple Vitamin (MULTIVITAMIN) tablet Take 1 tablet by mouth daily. 30 tablet 11   pantoprazole (PROTONIX) 40 MG tablet Take 1 tablet (40 mg total) by mouth daily. 30 tablet 11   QUEtiapine (SEROQUEL) 100 MG tablet Take 150 mg by mouth at bedtime.     rosuvastatin (CRESTOR) 40 MG tablet Take 1 tablet (40 mg total) by mouth every evening. 30 tablet 5   No current facility-administered medications for this visit.   Facility-Administered Medications Ordered in Other Visits  Medication Dose Route Frequency Provider Last Rate Last Admin   sodium chloride flush (NS) 0.9 % injection 3 mL  3 mL Intravenous Q12H Michael Hamman, FNP        Allergies:  Patient has no known allergies.    Social History:   reports that he has quit smoking. His smoking use included cigarettes. He has a 40.00 pack-year smoking history. He has never used smokeless tobacco. He reports that he does not currently use drugs after having used the following drugs: Marijuana. He reports that he does not drink alcohol.   Family History:  family history includes Cancer in his father; Heart disease in his father and mother; Stroke in his father.    ROS:     Review of Systems  Constitutional: Negative.   HENT: Negative.    Eyes: Negative.   Respiratory: Negative.    Gastrointestinal: Negative.   Genitourinary: Negative.   Musculoskeletal: Negative.   Skin: Negative.   Neurological: Negative.   Endo/Heme/Allergies: Negative.   Psychiatric/Behavioral: Negative.    All  other systems reviewed and are negative.     All other systems are reviewed and negative.    PHYSICAL EXAM: VS:  BP 110/60   Pulse 79   Ht 6' (1.829 m)   Wt 135 lb (61.2 kg)   SpO2 99%   BMI 18.31 kg/m  , BMI Body mass index is 18.31 kg/m. Last weight:  Wt Readings from Last 3 Encounters:  01/25/23 135 lb (61.2 kg)  01/10/23 133 lb 3.2 oz (60.4 kg)  01/02/23 133 lb 3.2 oz (60.4 kg)     Physical Exam Vitals reviewed.  Constitutional:      Appearance: Normal appearance. He is normal weight.  HENT:     Head: Normocephalic.     Nose: Nose normal.     Mouth/Throat:     Mouth: Mucous membranes are moist.  Eyes:     Pupils: Pupils are equal, round, and reactive to light.  Cardiovascular:     Rate and Rhythm: Normal rate and regular rhythm.     Pulses: Normal pulses.     Heart sounds: Normal heart sounds.  Pulmonary:     Effort: Pulmonary effort is normal.  Abdominal:     General: Abdomen is flat. Bowel sounds are normal.  Musculoskeletal:        General: Normal range of motion.     Cervical back: Normal range of motion.  Skin:    General: Skin is warm.  Neurological:     General: No focal deficit present.     Mental Status: He is alert.  Psychiatric:        Mood and Affect: Mood normal.       EKG:   Recent Labs: 09/27/2022: Platelets 194 12/28/2022: ALT 64; BUN 14; Creatinine, Ser 0.90; Hemoglobin 12.7; Potassium 3.5; Sodium 145; TSH 2.560    Lipid Panel    Component Value Date/Time   CHOL 84 (L) 12/28/2022 0857   TRIG 82 12/28/2022 0857   HDL 35 (L) 12/28/2022 0857   CHOLHDL 2.4 12/28/2022 0857   LDLCALC 32 12/28/2022 0857      Other studies Reviewed: Additional studies/ records that were reviewed today include:  Review of the above records demonstrates:       No data to display            ASSESSMENT AND PLAN:    ICD-10-CM   1. Coronary artery disease involving native coronary artery of native heart without angina pectoris  I25.10 CT  CHEST LUNG CANCER SCREENING LOW DOSE WO CONTRAST   Stent in LAD has no restenosis but has 40-50-% disease distal to stent and limited lung study has left sided mass, advise  full ct chest by radiology    2. Dyspnea, unspecified type  R06.00 CT CHEST LUNG CANCER SCREENING LOW DOSE WO CONTRAST    3. Mixed hyperlipidemia  E78.2 CT CHEST LUNG CANCER SCREENING LOW DOSE WO CONTRAST    4. History of type 2 diabetes mellitus  Z86.39 CT CHEST LUNG CANCER SCREENING LOW DOSE WO CONTRAST    5. Lung mass  R91.8 CT CHEST LUNG CANCER SCREENING LOW DOSE WO CONTRAST   on limited cCCTA, advise full ct chest by radiology       Problem List Items Addressed This Visit       Cardiovascular and Mediastinum   Coronary artery disease - Primary   Relevant Orders   CT CHEST LUNG CANCER SCREENING LOW DOSE WO CONTRAST     Other   Dyspnea   Relevant Orders   CT CHEST LUNG CANCER SCREENING LOW DOSE WO CONTRAST   History of type 2 diabetes mellitus   Relevant Orders   CT CHEST LUNG CANCER SCREENING LOW DOSE WO CONTRAST   Mixed hyperlipidemia   Relevant Orders   CT CHEST LUNG CANCER SCREENING LOW DOSE WO CONTRAST   Other Visit Diagnoses     Lung mass       on limited cCCTA, advise full ct chest by radiology   Relevant Orders   CT CHEST LUNG CANCER SCREENING LOW DOSE WO CONTRAST          Disposition:   Return in about 4 weeks (around 02/22/2023) for ct chest and f/u.    Total time spent: 30 minutes  Signed,  Michael Blackwater, MD  01/25/2023 11:10 AM    [facility]

## 2023-02-04 ENCOUNTER — Ambulatory Visit (INDEPENDENT_AMBULATORY_CARE_PROVIDER_SITE_OTHER): Payer: 59

## 2023-02-04 DIAGNOSIS — E782 Mixed hyperlipidemia: Secondary | ICD-10-CM

## 2023-02-04 DIAGNOSIS — F17219 Nicotine dependence, cigarettes, with unspecified nicotine-induced disorders: Secondary | ICD-10-CM | POA: Diagnosis not present

## 2023-02-04 DIAGNOSIS — R918 Other nonspecific abnormal finding of lung field: Secondary | ICD-10-CM

## 2023-02-04 DIAGNOSIS — I2510 Atherosclerotic heart disease of native coronary artery without angina pectoris: Secondary | ICD-10-CM

## 2023-02-04 DIAGNOSIS — Z8639 Personal history of other endocrine, nutritional and metabolic disease: Secondary | ICD-10-CM

## 2023-02-04 DIAGNOSIS — R0600 Dyspnea, unspecified: Secondary | ICD-10-CM

## 2023-02-18 ENCOUNTER — Other Ambulatory Visit: Payer: Self-pay | Admitting: Cardiovascular Disease

## 2023-03-04 ENCOUNTER — Ambulatory Visit (INDEPENDENT_AMBULATORY_CARE_PROVIDER_SITE_OTHER): Payer: 59 | Admitting: Cardiology

## 2023-03-04 ENCOUNTER — Encounter: Payer: Self-pay | Admitting: Cardiology

## 2023-03-04 VITALS — BP 105/60 | HR 56 | Ht 72.0 in | Wt 141.4 lb

## 2023-03-04 DIAGNOSIS — I2510 Atherosclerotic heart disease of native coronary artery without angina pectoris: Secondary | ICD-10-CM | POA: Diagnosis not present

## 2023-03-04 DIAGNOSIS — E782 Mixed hyperlipidemia: Secondary | ICD-10-CM | POA: Diagnosis not present

## 2023-03-04 DIAGNOSIS — R918 Other nonspecific abnormal finding of lung field: Secondary | ICD-10-CM

## 2023-03-04 NOTE — Assessment & Plan Note (Signed)
Feeling well.  No complaints.

## 2023-03-04 NOTE — Progress Notes (Signed)
Cardiology Office Note   Date:  03/04/2023   ID:  Michael Wall, DOB 1961-04-14, MRN [id number]  PCP:  Sherron Monday, MD  Cardiologist:  Marisue Ivan, NP      History of Present Illness: Michael Wall is a 62 y.o. male who presents for  Chief Complaint  Patient presents with   Follow-up    1 mo F/U    Patient in office for 1 month follow up. Patient doing well.  No complaints. Had CT chest.      Past Medical History:  Diagnosis Date   Arthritis    Coronary artery disease    Diabetes (HCC)    Dyspnea    GERD (gastroesophageal reflux disease)    HBP (high blood pressure)    Myocardial infarction (HCC)    Schizophrenia (HCC)      Past Surgical History:  Procedure Laterality Date   CARDIAC CATHETERIZATION     COLONOSCOPY WITH PROPOFOL N/A 05/27/2017   Procedure: COLONOSCOPY WITH PROPOFOL;  Surgeon: Scot Jun, MD;  Location: [facility] ENDOSCOPY;  Service: Endoscopy;  Laterality: N/A;   COLONOSCOPY WITH PROPOFOL N/A 10/21/2017   Procedure: COLONOSCOPY WITH PROPOFOL;  Surgeon: Scot Jun, MD;  Location: [facility] ENDOSCOPY;  Service: Endoscopy;  Laterality: N/A;   CORONARY ANGIOPLASTY     FINGER AMPUTATION Left    LEFT HEART CATH AND CORONARY ANGIOGRAPHY Left 01/02/2019   Procedure: LEFT HEART CATH AND CORONARY ANGIOGRAPHY;  Surgeon: Laurier Nancy, MD;  Location: [facility] INVASIVE CV LAB;  Service: Cardiovascular;  Laterality: Left;     Current Outpatient Medications  Medication Sig Dispense Refill   acetaminophen (TYLENOL) 325 MG tablet Take 2 tablets (650 mg total) by mouth every 8 (eight) hours as needed.     aspirin EC 81 MG tablet Take 81 mg by mouth daily.     benztropine (COGENTIN) 2 MG tablet Take 2 mg by mouth 2 (two) times daily.     clopidogrel (PLAVIX) 75 MG tablet TAKE 1 TABLET BY MOUTH DAILY 30 tablet 3   empagliflozin (JARDIANCE) 25 MG TABS tablet Take 1 tablet (25 mg total) by mouth every morning. 90 tablet 1   glucose blood  (ONETOUCH ULTRA) test strip USE WITH DEVICE TO CHECK BLOOD SUGARS TWICE DAILY 100 each 3   haloperidol (HALDOL) 10 MG tablet Take 10 mg by mouth 3 (three) times daily.      losartan (COZAAR) 50 MG tablet Take 1 tablet (50 mg total) by mouth daily. 90 tablet 2   metFORMIN (GLUCOPHAGE) 500 MG tablet Take 1 tablet (500 mg total) by mouth 2 (two) times daily. 180 tablet 0   metoprolol succinate (TOPROL-XL) 100 MG 24 hr tablet TAKE 1 TABLET BY MOUTH DAILY 90 tablet 3   Multiple Vitamin (MULTIVITAMIN) tablet Take 1 tablet by mouth daily. 30 tablet 11   pantoprazole (PROTONIX) 40 MG tablet Take 1 tablet (40 mg total) by mouth daily. 30 tablet 11   QUEtiapine (SEROQUEL) 100 MG tablet Take 150 mg by mouth at bedtime.     rosuvastatin (CRESTOR) 40 MG tablet Take 1 tablet (40 mg total) by mouth every evening. 30 tablet 5   mirtazapine (REMERON) 45 MG tablet Take 45 mg by mouth at bedtime.      No current facility-administered medications for this visit.   Facility-Administered Medications Ordered in Other Visits  Medication Dose Route Frequency Provider Last Rate Last Admin   sodium chloride flush (NS) 0.9 % injection 3 mL  3 mL Intravenous Q12H Caroleen Hamman, FNP        Allergies:   Patient has no known allergies.    Social History:   reports that he has quit smoking. His smoking use included cigarettes. He has a 40 pack-year smoking history. He has never used smokeless tobacco. He reports that he does not currently use drugs after having used the following drugs: Marijuana. He reports that he does not drink alcohol.   Family History:  family history includes Cancer in his father; Heart disease in his father and mother; Stroke in his father.    ROS:     Review of Systems  Constitutional: Negative.   HENT: Negative.    Eyes: Negative.   Respiratory: Negative.    Cardiovascular: Negative.   Gastrointestinal: Negative.   Genitourinary: Negative.   Musculoskeletal: Negative.   Skin:  Negative.   Neurological: Negative.   Endo/Heme/Allergies: Negative.   Psychiatric/Behavioral: Negative.    All other systems reviewed and are negative.    All other systems are reviewed and negative.    PHYSICAL EXAM: VS:  BP 105/60   Pulse (!) 56   Ht 6' (1.829 m)   Wt 141 lb 6.4 oz (64.1 kg)   SpO2 99%   BMI 19.18 kg/m  , BMI Body mass index is 19.18 kg/m. Last weight:  Wt Readings from Last 3 Encounters:  03/04/23 141 lb 6.4 oz (64.1 kg)  01/25/23 135 lb (61.2 kg)  01/10/23 133 lb 3.2 oz (60.4 kg)     Physical Exam Vitals and nursing note reviewed.  Constitutional:      Appearance: Normal appearance. He is normal weight.  HENT:     Head: Normocephalic and atraumatic.     Nose: Nose normal.     Mouth/Throat:     Mouth: Mucous membranes are moist.     Pharynx: Oropharynx is clear.  Eyes:     Extraocular Movements: Extraocular movements intact.     Conjunctiva/sclera: Conjunctivae normal.     Pupils: Pupils are equal, round, and reactive to light.  Cardiovascular:     Rate and Rhythm: Normal rate and regular rhythm.     Pulses: Normal pulses.     Heart sounds: Normal heart sounds.  Pulmonary:     Effort: Pulmonary effort is normal.     Breath sounds: Normal breath sounds.  Abdominal:     General: Abdomen is flat. Bowel sounds are normal.     Palpations: Abdomen is soft.  Musculoskeletal:        General: Normal range of motion.     Cervical back: Normal range of motion.  Skin:    General: Skin is warm and dry.  Neurological:     General: No focal deficit present.     Mental Status: He is alert and oriented to person, place, and time. Mental status is at baseline.  Psychiatric:        Mood and Affect: Mood normal.        Behavior: Behavior normal.        Thought Content: Thought content normal.        Judgment: Judgment normal.      EKG: none today  Recent Labs: 09/27/2022: Platelets 194 12/28/2022: ALT 64; BUN 14; Creatinine, Ser 0.90; Hemoglobin  12.7; Potassium 3.5; Sodium 145; TSH 2.560    Lipid Panel    Component Value Date/Time   CHOL 84 (L) 12/28/2022 0857   TRIG 82 12/28/2022 0857   HDL 35 (L) 12/28/2022  0857   CHOLHDL 2.4 12/28/2022 0857   LDLCALC 32 12/28/2022 0857     ASSESSMENT AND PLAN:    ICD-10-CM   1. Coronary artery disease involving native heart without angina pectoris, unspecified vessel or lesion type  I25.10     2. Mixed hyperlipidemia  E78.2     3. Lung mass  R91.8 CT CHEST LUNG CA SCREEN LOW DOSE W/O CM       Problem List Items Addressed This Visit       Cardiovascular and Mediastinum   Coronary artery disease - Primary    Feeling well. No complaints.         Other   Mixed hyperlipidemia   Lung mass    CT lungs 02/04/23 Fibronodular changes in right upper lobe, recommended repeat in 6-8 weeks.       Relevant Orders   CT CHEST LUNG CA SCREEN LOW DOSE W/O CM     Disposition:   Return in about 2 months (around 05/05/2023) for after CT chest.    Total time spent: 25 minutes  Signed,  Google, NP  03/04/2023 11:14 AM    [facility]

## 2023-03-04 NOTE — Assessment & Plan Note (Signed)
CT lungs 02/04/23 Fibronodular changes in right upper lobe, recommended repeat in 6-8 weeks.

## 2023-03-29 ENCOUNTER — Other Ambulatory Visit: Payer: Self-pay

## 2023-03-29 ENCOUNTER — Other Ambulatory Visit: Payer: 59

## 2023-03-29 DIAGNOSIS — E870 Hyperosmolality and hypernatremia: Secondary | ICD-10-CM

## 2023-03-29 DIAGNOSIS — I2510 Atherosclerotic heart disease of native coronary artery without angina pectoris: Secondary | ICD-10-CM | POA: Diagnosis not present

## 2023-03-29 DIAGNOSIS — E782 Mixed hyperlipidemia: Secondary | ICD-10-CM

## 2023-03-29 DIAGNOSIS — E119 Type 2 diabetes mellitus without complications: Secondary | ICD-10-CM | POA: Diagnosis not present

## 2023-03-30 LAB — CBC WITH DIFF/PLATELET
Basophils Absolute: 0 10*3/uL (ref 0.0–0.2)
Basos: 0 %
EOS (ABSOLUTE): 0.1 10*3/uL (ref 0.0–0.4)
Eos: 2 %
Hematocrit: 41.2 % (ref 37.5–51.0)
Hemoglobin: 12.9 g/dL — ABNORMAL LOW (ref 13.0–17.7)
Immature Grans (Abs): 0 10*3/uL (ref 0.0–0.1)
Immature Granulocytes: 0 %
Lymphocytes Absolute: 2 10*3/uL (ref 0.7–3.1)
Lymphs: 38 %
MCH: 28.7 pg (ref 26.6–33.0)
MCHC: 31.3 g/dL — ABNORMAL LOW (ref 31.5–35.7)
MCV: 92 fL (ref 79–97)
Monocytes Absolute: 0.4 10*3/uL (ref 0.1–0.9)
Monocytes: 8 %
Neutrophils Absolute: 2.8 10*3/uL (ref 1.4–7.0)
Neutrophils: 52 %
Platelets: 175 10*3/uL (ref 150–450)
RBC: 4.49 x10E6/uL (ref 4.14–5.80)
RDW: 13.2 % (ref 11.6–15.4)
WBC: 5.4 10*3/uL (ref 3.4–10.8)

## 2023-03-30 LAB — COMPREHENSIVE METABOLIC PANEL
ALT: 32 IU/L (ref 0–44)
AST: 22 IU/L (ref 0–40)
Albumin: 4.2 g/dL (ref 3.9–4.9)
Alkaline Phosphatase: 74 IU/L (ref 44–121)
BUN/Creatinine Ratio: 28 — ABNORMAL HIGH (ref 10–24)
BUN: 23 mg/dL (ref 8–27)
Bilirubin Total: 0.3 mg/dL (ref 0.0–1.2)
CO2: 22 mmol/L (ref 20–29)
Calcium: 9.3 mg/dL (ref 8.6–10.2)
Chloride: 107 mmol/L — ABNORMAL HIGH (ref 96–106)
Creatinine, Ser: 0.82 mg/dL (ref 0.76–1.27)
Globulin, Total: 2.1 g/dL (ref 1.5–4.5)
Glucose: 173 mg/dL — ABNORMAL HIGH (ref 70–99)
Potassium: 4.7 mmol/L (ref 3.5–5.2)
Sodium: 144 mmol/L (ref 134–144)
Total Protein: 6.3 g/dL (ref 6.0–8.5)
eGFR: 99 mL/min/{1.73_m2} (ref >59)

## 2023-03-30 LAB — HEPATIC FUNCTION PANEL: Bilirubin, Direct: 0.11 mg/dL (ref 0.00–0.40)

## 2023-03-30 LAB — LIPID PANEL
Chol/HDL Ratio: 2.3 ratio (ref 0.0–5.0)
Cholesterol, Total: 104 mg/dL (ref 100–199)
HDL: 46 mg/dL (ref >39)
LDL Chol Calc (NIH): 43 mg/dL (ref 0–99)
Triglycerides: 71 mg/dL (ref 0–149)
VLDL Cholesterol Cal: 15 mg/dL (ref 5–40)

## 2023-03-30 LAB — HEMOGLOBIN A1C
Est. average glucose Bld gHb Est-mCnc: 180 mg/dL
Hgb A1c MFr Bld: 7.9 % — ABNORMAL HIGH (ref 4.8–5.6)

## 2023-03-30 LAB — CK: Total CK: 42 U/L (ref 41–331)

## 2023-04-02 ENCOUNTER — Ambulatory Visit: Payer: 59

## 2023-04-02 DIAGNOSIS — R918 Other nonspecific abnormal finding of lung field: Secondary | ICD-10-CM | POA: Diagnosis not present

## 2023-04-02 DIAGNOSIS — F17219 Nicotine dependence, cigarettes, with unspecified nicotine-induced disorders: Secondary | ICD-10-CM | POA: Diagnosis not present

## 2023-04-05 ENCOUNTER — Ambulatory Visit (INDEPENDENT_AMBULATORY_CARE_PROVIDER_SITE_OTHER): Payer: 59 | Admitting: Internal Medicine

## 2023-04-05 VITALS — BP 130/70 | HR 65 | Ht 72.0 in | Wt 146.0 lb

## 2023-04-05 DIAGNOSIS — Z1331 Encounter for screening for depression: Secondary | ICD-10-CM

## 2023-04-05 DIAGNOSIS — I10 Essential (primary) hypertension: Secondary | ICD-10-CM

## 2023-04-05 DIAGNOSIS — I2510 Atherosclerotic heart disease of native coronary artery without angina pectoris: Secondary | ICD-10-CM

## 2023-04-05 DIAGNOSIS — E782 Mixed hyperlipidemia: Secondary | ICD-10-CM | POA: Diagnosis not present

## 2023-04-05 DIAGNOSIS — E119 Type 2 diabetes mellitus without complications: Secondary | ICD-10-CM | POA: Diagnosis not present

## 2023-04-05 LAB — POC CREATINE & ALBUMIN,URINE
Albumin/Creatinine Ratio, Urine, POC: <30
Creatinine, POC: 50 mg/dL
Microalbumin Ur, POC: 10 mg/L

## 2023-04-05 LAB — POCT CBG (FASTING - GLUCOSE)-MANUAL ENTRY

## 2023-04-05 MED ORDER — METFORMIN HCL 850 MG PO TABS
850.0000 mg | ORAL_TABLET | Freq: Two times a day (BID) | ORAL | 2 refills | Status: DC
Start: 2023-04-05 — End: 2023-07-01

## 2023-04-05 NOTE — Progress Notes (Unsigned)
Established Patient Office Visit  Subjective:  Patient ID: Michael Wall, male    DOB: 1960/11/06  Age: 62 y.o. MRN: [id number]  Chief Complaint  Patient presents with   Annual Exam    CPE & 3 mo lab results    No new complaints, here for CPE, lab review and medication refills. Labs reviewed and notable for uncontrolled diabetes, A1c rose to 7.9 from 5.9  lipids at target with unremarkable cmp. Admits to dietary indiscretion. Stable anemia otherwise unremarkable cbc.     No other concerns at this time.   Past Medical History:  Diagnosis Date   Arthritis    Coronary artery disease    Diabetes (HCC)    Dyspnea    GERD (gastroesophageal reflux disease)    HBP (high blood pressure)    Myocardial infarction (HCC)    Schizophrenia (HCC)     Past Surgical History:  Procedure Laterality Date   CARDIAC CATHETERIZATION     COLONOSCOPY WITH PROPOFOL N/A 05/27/2017   Procedure: COLONOSCOPY WITH PROPOFOL;  Surgeon: Scot Jun, MD;  Location: [facility] ENDOSCOPY;  Service: Endoscopy;  Laterality: N/A;   COLONOSCOPY WITH PROPOFOL N/A 10/21/2017   Procedure: COLONOSCOPY WITH PROPOFOL;  Surgeon: Scot Jun, MD;  Location: [facility] ENDOSCOPY;  Service: Endoscopy;  Laterality: N/A;   CORONARY ANGIOPLASTY     FINGER AMPUTATION Left    LEFT HEART CATH AND CORONARY ANGIOGRAPHY Left 01/02/2019   Procedure: LEFT HEART CATH AND CORONARY ANGIOGRAPHY;  Surgeon: Laurier Nancy, MD;  Location: [facility] INVASIVE CV LAB;  Service: Cardiovascular;  Laterality: Left;    Social History   Socioeconomic History   Marital status: Single    Spouse name: Not on file   Number of children: Not on file   Years of education: Not on file   Highest education level: Not on file  Occupational History   Not on file  Tobacco Use   Smoking status: Former    Current packs/day: 2.00    Average packs/day: 2.0 packs/day for 20.0 years (40.0 ttl pk-yrs)    Types: Cigarettes   Smokeless tobacco: Never    Tobacco comments:    quit 1946  Vaping Use   Vaping status: Never Used  Substance and Sexual Activity   Alcohol use: No   Drug use: Not Currently    Types: Marijuana   Sexual activity: Not on file  Other Topics Concern   Not on file  Social History Narrative   Not on file   Social Determinants of Health   Financial Resource Strain: Not on file  Food Insecurity: Not on file  Transportation Needs: Not on file  Physical Activity: Not on file  Stress: Not on file  Social Connections: Not on file  Intimate Partner Violence: Not on file    Family History  Problem Relation Age of Onset   Heart disease Mother    Cancer Father    Stroke Father    Heart disease Father     No Known Allergies  Review of Systems  Constitutional:  Negative for weight loss (gained 5 lbs).  HENT: Negative.    Eyes: Negative.   Respiratory: Negative.    Cardiovascular: Negative.  Negative for chest pain and palpitations.  Gastrointestinal: Negative.   Genitourinary: Negative.   Musculoskeletal: Negative.   Skin: Negative.   Neurological: Negative.   Endo/Heme/Allergies: Negative.        Objective:   BP 130/70   Pulse 65   Ht 6' (1.829  m)   Wt 146 lb (66.2 kg)   SpO2 99%   BMI 19.80 kg/m   Vitals:   04/05/23 0935  BP: 130/70  Pulse: 65  Height: 6' (1.829 m)  Weight: 146 lb (66.2 kg)  SpO2: 99%  BMI (Calculated): 19.8    Physical Exam Vitals reviewed.  Constitutional:      Appearance: Normal appearance.  HENT:     Head: Normocephalic.     Left Ear: There is no impacted cerumen.     Nose: Nose normal.     Mouth/Throat:     Mouth: Mucous membranes are moist.     Pharynx: No posterior oropharyngeal erythema.  Eyes:     Extraocular Movements: Extraocular movements intact.     Pupils: Pupils are equal, round, and reactive to light.  Cardiovascular:     Rate and Rhythm: Regular rhythm.     Chest Wall: PMI is not displaced.     Pulses: Normal pulses.     Heart sounds:  Normal heart sounds. No murmur heard. Pulmonary:     Effort: Pulmonary effort is normal.     Breath sounds: Normal air entry. No rhonchi or rales.  Abdominal:     General: Abdomen is flat. Bowel sounds are normal. There is no distension.     Palpations: Abdomen is soft. There is no hepatomegaly, splenomegaly or mass.     Tenderness: There is no abdominal tenderness.  Musculoskeletal:        General: Normal range of motion.     Cervical back: Normal range of motion and neck supple.     Right lower leg: No edema.     Left lower leg: No edema.  Skin:    General: Skin is warm and dry.  Neurological:     General: No focal deficit present.     Mental Status: He is alert and oriented to person, place, and time.     Cranial Nerves: No cranial nerve deficit.     Motor: No weakness.  Psychiatric:        Mood and Affect: Mood normal.        Behavior: Behavior normal.      Results for orders placed or performed in visit on 04/05/23  POCT CBG (Fasting - Glucose)  Result Value Ref Range   Glucose Fasting, POC      Recent Results (from the past 2160 hour(Nakaiya Beddow))  CK     Status: None   Collection Time: 03/29/23  9:20 AM  Result Value Ref Range   Total CK 42 41 - 331 U/L  Hemoglobin A1c     Status: Abnormal   Collection Time: 03/29/23  9:20 AM  Result Value Ref Range   Hgb A1c MFr Bld 7.9 (H) 4.8 - 5.6 %    Comment:          Prediabetes: 5.7 - 6.4          Diabetes: >6.4          Glycemic control for adults with diabetes: <7.0    Est. average glucose Bld gHb Est-mCnc 180 mg/dL  Lipid panel     Status: None   Collection Time: 03/29/23  9:20 AM  Result Value Ref Range   Cholesterol, Total 104 100 - 199 mg/dL   Triglycerides 71 0 - 149 mg/dL   HDL 46 >14 mg/dL   VLDL Cholesterol Cal 15 5 - 40 mg/dL   LDL Chol Calc (NIH) 43 0 - 99 mg/dL   Chol/HDL  Ratio 2.3 0.0 - 5.0 ratio    Comment:                                   T. Chol/HDL Ratio                                             Men   Women                               1/2 Avg.Risk  3.4    3.3                                   Avg.Risk  5.0    4.4                                2X Avg.Risk  9.6    7.1                                3X Avg.Risk 23.4   11.0   Hepatic function panel     Status: None   Collection Time: 03/29/23  9:20 AM  Result Value Ref Range   Bilirubin, Direct 0.11 0.00 - 0.40 mg/dL  CBC With Diff/Platelet     Status: Abnormal   Collection Time: 03/29/23  9:20 AM  Result Value Ref Range   WBC 5.4 3.4 - 10.8 x10E3/uL   RBC 4.49 4.14 - 5.80 x10E6/uL   Hemoglobin 12.9 (L) 13.0 - 17.7 g/dL   Hematocrit 82.9 56.2 - 51.0 %   MCV 92 79 - 97 fL   MCH 28.7 26.6 - 33.0 pg   MCHC 31.3 (L) 31.5 - 35.7 g/dL   RDW 13.0 86.5 - 78.4 %   Platelets 175 150 - 450 x10E3/uL   Neutrophils 52 Not Estab. %   Lymphs 38 Not Estab. %   Monocytes 8 Not Estab. %   Eos 2 Not Estab. %   Basos 0 Not Estab. %   Neutrophils Absolute 2.8 1.4 - 7.0 x10E3/uL   Lymphocytes Absolute 2.0 0.7 - 3.1 x10E3/uL   Monocytes Absolute 0.4 0.1 - 0.9 x10E3/uL   EOS (ABSOLUTE) 0.1 0.0 - 0.4 x10E3/uL   Basophils Absolute 0.0 0.0 - 0.2 x10E3/uL   Immature Granulocytes 0 Not Estab. %   Immature Grans (Abs) 0.0 0.0 - 0.1 x10E3/uL  Comprehensive metabolic panel     Status: Abnormal   Collection Time: 03/29/23  9:20 AM  Result Value Ref Range   Glucose 173 (H) 70 - 99 mg/dL   BUN 23 8 - 27 mg/dL   Creatinine, Ser 6.96 0.76 - 1.27 mg/dL   eGFR 99 >29 BM/WUX/3.24   BUN/Creatinine Ratio 28 (H) 10 - 24   Sodium 144 134 - 144 mmol/L   Potassium 4.7 3.5 - 5.2 mmol/L   Chloride 107 (H) 96 - 106 mmol/L   CO2 22 20 - 29 mmol/L   Calcium 9.3 8.6 - 10.2 mg/dL   Total Protein 6.3 6.0 - 8.5 g/dL   Albumin 4.2 3.9 - 4.9 g/dL  Globulin, Total 2.1 1.5 - 4.5 g/dL   Bilirubin Total 0.3 0.0 - 1.2 mg/dL   Alkaline Phosphatase 74 44 - 121 IU/L   AST 22 0 - 40 IU/L   ALT 32 0 - 44 IU/L  POCT CBG (Fasting - Glucose)     Status: None   Collection Time:  04/05/23  9:47 AM  Result Value Ref Range   Glucose Fasting, POC        Assessment & Plan:  As per problem list  Problem List Items Addressed This Visit       Cardiovascular and Mediastinum   Coronary artery disease   Primary hypertension     Endocrine   Diabetes (HCC) - Primary   Relevant Medications   metFORMIN (GLUCOPHAGE) 850 MG tablet   Other Relevant Orders   POCT CBG (Fasting - Glucose) (Completed)   Fructosamine   POC CREATINE & ALBUMIN,URINE     Other   Mixed hyperlipidemia    Return in about 6 weeks (around 05/17/2023) for fu with labs prior.   Total time spent: 40 minutes  Luna Fuse, MD  04/05/2023   This document may have been prepared by [facility] Voice Recognition software and as such may include unintentional dictation errors.

## 2023-04-10 ENCOUNTER — Encounter: Payer: Self-pay | Admitting: Internal Medicine

## 2023-04-10 NOTE — Progress Notes (Signed)
Spoke with Tasha from the group home who was informed of pt results and verbalized understanding.

## 2023-04-16 ENCOUNTER — Other Ambulatory Visit: Payer: Self-pay | Admitting: Internal Medicine

## 2023-04-25 ENCOUNTER — Encounter: Payer: Self-pay | Admitting: Podiatry

## 2023-04-25 ENCOUNTER — Ambulatory Visit (INDEPENDENT_AMBULATORY_CARE_PROVIDER_SITE_OTHER): Payer: 59 | Admitting: Podiatry

## 2023-04-25 DIAGNOSIS — M79609 Pain in unspecified limb: Secondary | ICD-10-CM | POA: Diagnosis not present

## 2023-04-25 DIAGNOSIS — B351 Tinea unguium: Secondary | ICD-10-CM

## 2023-04-25 DIAGNOSIS — E119 Type 2 diabetes mellitus without complications: Secondary | ICD-10-CM | POA: Diagnosis not present

## 2023-04-29 NOTE — Progress Notes (Signed)
Subjective:  Patient ID: Michael Wall, male    DOB: Jun 15, 1961,  MRN: [id number]  Michael Wall presents to clinic today for preventative diabetic foot care and painful elongated mycotic toenails 1-5 bilaterally which are tender when wearing enclosed shoe gear. Pain is relieved with periodic professional debridement.  Chief Complaint  Patient presents with   Nail Problem    DFC,Referring Provider Sherron Monday, MD,lov:08/24,A1C:7.9      New problem(s): None.   PCP is Sherron Monday, MD.  No Known Allergies  Review of Systems: Negative except as noted in the HPI.  Objective: No changes noted in today's physical examination. There were no vitals filed for this visit. Michael Wall is a pleasant 62 y.o. male WD, WN in NAD. AAO x 3.  Vascular Examination: Capillary refill time immediate b/l. Vascular status intact b/l with palpable pedal pulses. Pedal hair present b/l. No pain with calf compression b/l. Skin temperature gradient WNL b/l. No cyanosis or clubbing b/l. No ischemia or gangrene noted b/l.   Neurological Examination: Sensation grossly intact b/l with 10 gram monofilament. Vibratory sensation intact b/l.   Dermatological Examination: Pedal skin with normal turgor, texture and tone b/l.  No open wounds. No interdigital macerations.   Toenails 1-5 b/l thick, discolored, elongated with subungual debris and pain on dorsal palpation.   Area of blanchable erythema at dorsal PIPJ b/l 2nd digits secondary to hammertoe deformity. No hyperkeratotic nor porokeratotic lesions present on today's visit.  Musculoskeletal Examination: Muscle strength 5/5 to all lower extremity muscle groups bilaterally. Hammertoe(s) noted to the bilateral 2nd toes.  Radiographs: None  Assessment/Plan: 1. Pain due to onychomycosis of nail   2. Diabetes mellitus without complication (HCC)     -Consent given for treatment as described below: -Examined patient. -Continue  foot and shoe inspections daily. Monitor blood glucose per PCP/Endocrinologist's recommendations. -Continue supportive shoe gear daily. -Mycotic toenails 1-5 bilaterally were debrided in length and girth with sterile nail nippers and dremel without incident. -Patient/POA to call should there be question/concern in the interim.   Return in about 3 months (around 07/25/2023).  Freddie Breech, DPM

## 2023-05-06 ENCOUNTER — Ambulatory Visit (INDEPENDENT_AMBULATORY_CARE_PROVIDER_SITE_OTHER): Payer: 59 | Admitting: Cardiovascular Disease

## 2023-05-06 ENCOUNTER — Encounter: Payer: Self-pay | Admitting: Cardiovascular Disease

## 2023-05-06 VITALS — BP 106/70 | HR 72 | Ht 71.0 in | Wt 142.6 lb

## 2023-05-06 DIAGNOSIS — I10 Essential (primary) hypertension: Secondary | ICD-10-CM

## 2023-05-06 DIAGNOSIS — E861 Hypovolemia: Secondary | ICD-10-CM

## 2023-05-06 DIAGNOSIS — I2510 Atherosclerotic heart disease of native coronary artery without angina pectoris: Secondary | ICD-10-CM | POA: Diagnosis not present

## 2023-05-06 DIAGNOSIS — Z8639 Personal history of other endocrine, nutritional and metabolic disease: Secondary | ICD-10-CM | POA: Diagnosis not present

## 2023-05-06 NOTE — Progress Notes (Signed)
Cardiology Office Note   Date:  05/06/2023   ID:  Michael Wall, DOB [birth year], MRN [id number]  PCP:  Sherron Monday, MD  Cardiologist:  Adrian Blackwater, MD      History of Present Illness: Michael Wall is a 62 y.o. male who presents for No chief complaint on file.   Doing well      Past Medical History:  Diagnosis Date   Arthritis    Coronary artery disease    Diabetes (HCC)    Dyspnea    GERD (gastroesophageal reflux disease)    HBP (high blood pressure)    Myocardial infarction (HCC)    Schizophrenia (HCC)      Past Surgical History:  Procedure Laterality Date   CARDIAC CATHETERIZATION     COLONOSCOPY WITH PROPOFOL N/A 05/27/2017   Procedure: COLONOSCOPY WITH PROPOFOL;  Surgeon: Scot Jun, MD;  Location: [facility] ENDOSCOPY;  Service: Endoscopy;  Laterality: N/A;   COLONOSCOPY WITH PROPOFOL N/A 10/21/2017   Procedure: COLONOSCOPY WITH PROPOFOL;  Surgeon: Scot Jun, MD;  Location: [facility] ENDOSCOPY;  Service: Endoscopy;  Laterality: N/A;   CORONARY ANGIOPLASTY     FINGER AMPUTATION Left    LEFT HEART CATH AND CORONARY ANGIOGRAPHY Left 01/02/2019   Procedure: LEFT HEART CATH AND CORONARY ANGIOGRAPHY;  Surgeon: Laurier Nancy, MD;  Location: [facility] INVASIVE CV LAB;  Service: Cardiovascular;  Laterality: Left;     Current Outpatient Medications  Medication Sig Dispense Refill   acetaminophen (TYLENOL) 325 MG tablet Take 2 tablets (650 mg total) by mouth every 8 (eight) hours as needed.     aspirin EC 81 MG tablet Take 81 mg by mouth daily.     benztropine (COGENTIN) 2 MG tablet Take 2 mg by mouth 2 (two) times daily.     clopidogrel (PLAVIX) 75 MG tablet TAKE 1 TABLET BY MOUTH DAILY 30 tablet 3   empagliflozin (JARDIANCE) 25 MG TABS tablet Take 1 tablet (25 mg total) by mouth every morning. 90 tablet 1   glucose blood (ONETOUCH ULTRA) test strip USE WITH DEVICE TO CHECK BLOOD SUGARS TWICE DAILY 100 each 3   haloperidol (HALDOL) 10 MG tablet  Take 10 mg by mouth 3 (three) times daily.      losartan (COZAAR) 50 MG tablet Take 1 tablet (50 mg total) by mouth daily. 90 tablet 2   metFORMIN (GLUCOPHAGE) 850 MG tablet Take 1 tablet (850 mg total) by mouth 2 (two) times daily with a meal. 60 tablet 2   metoprolol succinate (TOPROL-XL) 100 MG 24 hr tablet TAKE 1 TABLET BY MOUTH DAILY 90 tablet 3   mirtazapine (REMERON) 45 MG tablet Take 45 mg by mouth at bedtime.      Multiple Vitamin (MULTIVITAMIN) tablet Take 1 tablet by mouth daily. 30 tablet 11   pantoprazole (PROTONIX) 40 MG tablet Take 1 tablet (40 mg total) by mouth daily. 30 tablet 11   QUEtiapine (SEROQUEL) 100 MG tablet Take 150 mg by mouth at bedtime.     rosuvastatin (CRESTOR) 40 MG tablet Take 1 tablet (40 mg total) by mouth every evening. 30 tablet 5   No current facility-administered medications for this visit.   Facility-Administered Medications Ordered in Other Visits  Medication Dose Route Frequency Provider Last Rate Last Admin   sodium chloride flush (NS) 0.9 % injection 3 mL  3 mL Intravenous Q12H Caroleen Hamman, FNP        Allergies:   Patient has no known allergies.  Social History:   reports that he has quit smoking. His smoking use included cigarettes. He has a 40 pack-year smoking history. He has never used smokeless tobacco. He reports that he does not currently use drugs after having used the following drugs: Marijuana. He reports that he does not drink alcohol.   Family History:  family history includes Cancer in his father; Heart disease in his father and mother; Stroke in his father.    ROS:     Review of Systems  Constitutional: Negative.   HENT: Negative.    Eyes: Negative.   Respiratory: Negative.    Gastrointestinal: Negative.   Genitourinary: Negative.   Musculoskeletal: Negative.   Skin: Negative.   Neurological: Negative.   Endo/Heme/Allergies: Negative.   Psychiatric/Behavioral: Negative.    All other systems reviewed and are  negative.     All other systems are reviewed and negative.    PHYSICAL EXAM: VS:  BP 106/70   Pulse 72   Ht 5\' 11"  (1.803 m)   Wt 142 lb 9.6 oz (64.7 kg)   SpO2 100%   BMI 19.89 kg/m  , BMI Body mass index is 19.89 kg/m. Last weight:  Wt Readings from Last 3 Encounters:  05/06/23 142 lb 9.6 oz (64.7 kg)  04/05/23 146 lb (66.2 kg)  03/04/23 141 lb 6.4 oz (64.1 kg)     Physical Exam Vitals reviewed.  Constitutional:      Appearance: Normal appearance. He is normal weight.  HENT:     Head: Normocephalic.     Nose: Nose normal.     Mouth/Throat:     Mouth: Mucous membranes are moist.  Eyes:     Pupils: Pupils are equal, round, and reactive to light.  Cardiovascular:     Rate and Rhythm: Normal rate and regular rhythm.     Pulses: Normal pulses.     Heart sounds: Normal heart sounds.  Pulmonary:     Effort: Pulmonary effort is normal.  Abdominal:     General: Abdomen is flat. Bowel sounds are normal.  Musculoskeletal:        General: Normal range of motion.     Cervical back: Normal range of motion.  Skin:    General: Skin is warm.  Neurological:     General: No focal deficit present.     Mental Status: He is alert.  Psychiatric:        Mood and Affect: Mood normal.       EKG:   Recent Labs: 12/28/2022: TSH 2.560 03/29/2023: ALT 32; BUN 23; Creatinine, Ser 0.82; Hemoglobin 12.9; Platelets 175; Potassium 4.7; Sodium 144    Lipid Panel    Component Value Date/Time   CHOL 104 03/29/2023 0920   TRIG 71 03/29/2023 0920   HDL 46 03/29/2023 0920   CHOLHDL 2.3 03/29/2023 0920   LDLCALC 43 03/29/2023 0920      Other studies Reviewed: Additional studies/ records that were reviewed today include:  Review of the above records demonstrates:       No data to display            ASSESSMENT AND PLAN:    ICD-10-CM   1. Two-vessel coronary artery disease  I25.10    stable    2. Primary hypertension  I10     3. Hypotension due to hypovolemia  E86.1      4. History of type 2 diabetes mellitus  Z86.39        Problem List Items Addressed This Visit  Cardiovascular and Mediastinum   Primary hypertension   Two-vessel coronary artery disease - Primary   Hypotension     Other   History of type 2 diabetes mellitus       Disposition:   Return in about 3 months (around 08/05/2023).    Total time spent: 30 minutes  Signed,  Adrian Blackwater, MD  05/06/2023 10:42 AM    [facility]

## 2023-05-15 ENCOUNTER — Other Ambulatory Visit: Payer: 59

## 2023-05-15 DIAGNOSIS — E119 Type 2 diabetes mellitus without complications: Secondary | ICD-10-CM

## 2023-05-15 DIAGNOSIS — E782 Mixed hyperlipidemia: Secondary | ICD-10-CM

## 2023-05-16 LAB — FRUCTOSAMINE: Fructosamine: 271 umol/L (ref 0–285)

## 2023-05-16 LAB — LIPID PANEL
Chol/HDL Ratio: 2.3 {ratio} (ref 0.0–5.0)
Cholesterol, Total: 90 mg/dL — ABNORMAL LOW (ref 100–199)
HDL: 40 mg/dL (ref >39)
LDL Chol Calc (NIH): 31 mg/dL (ref 0–99)
Triglycerides: 97 mg/dL (ref 0–149)
VLDL Cholesterol Cal: 19 mg/dL (ref 5–40)

## 2023-05-16 LAB — HEMOGLOBIN A1C
Est. average glucose Bld gHb Est-mCnc: 174 mg/dL
Hgb A1c MFr Bld: 7.7 % — ABNORMAL HIGH (ref 4.8–5.6)

## 2023-05-17 ENCOUNTER — Encounter: Payer: Self-pay | Admitting: Internal Medicine

## 2023-05-17 ENCOUNTER — Ambulatory Visit (INDEPENDENT_AMBULATORY_CARE_PROVIDER_SITE_OTHER): Payer: 59 | Admitting: Internal Medicine

## 2023-05-17 VITALS — BP 120/75 | HR 69 | Ht 71.0 in | Wt 145.6 lb

## 2023-05-17 DIAGNOSIS — E119 Type 2 diabetes mellitus without complications: Secondary | ICD-10-CM

## 2023-05-17 DIAGNOSIS — E782 Mixed hyperlipidemia: Secondary | ICD-10-CM | POA: Diagnosis not present

## 2023-05-17 DIAGNOSIS — I10 Essential (primary) hypertension: Secondary | ICD-10-CM

## 2023-05-17 LAB — POCT CBG (FASTING - GLUCOSE)-MANUAL ENTRY: Glucose Fasting, POC: 136 mg/dL — AB (ref 70–99)

## 2023-05-17 MED ORDER — GLIMEPIRIDE 2 MG PO TABS: 2.0000 mg | ORAL_TABLET | ORAL | 2 refills | Status: DC

## 2023-05-17 NOTE — Progress Notes (Signed)
Established Patient Office Visit  Subjective:  Patient ID: Michael Wall, male    DOB: [birth year]  Age: 62 y.o. MRN: [id number]  Chief Complaint  Patient presents with   Follow-up    6 week f/u with lab results    No new complaints, here for lab review and medication refills. Labs reviewed and notable for uncontrolled diabetes, A1c improved but not at target, lipids at target. Denies any hypoglycemic episodes and home bg readings have been at target in the evening but high fasting.   No other concerns at this time.   Past Medical History:  Diagnosis Date   Arthritis    Coronary artery disease    Diabetes (HCC)    Dyspnea    GERD (gastroesophageal reflux disease)    HBP (high blood pressure)    Myocardial infarction (HCC)    Schizophrenia (HCC)     Past Surgical History:  Procedure Laterality Date   CARDIAC CATHETERIZATION     COLONOSCOPY WITH PROPOFOL N/A 05/27/2017   Procedure: COLONOSCOPY WITH PROPOFOL;  Surgeon: Scot Jun, MD;  Location: [facility] ENDOSCOPY;  Service: Endoscopy;  Laterality: N/A;   COLONOSCOPY WITH PROPOFOL N/A 10/21/2017   Procedure: COLONOSCOPY WITH PROPOFOL;  Surgeon: Scot Jun, MD;  Location: [facility] ENDOSCOPY;  Service: Endoscopy;  Laterality: N/A;   CORONARY ANGIOPLASTY     FINGER AMPUTATION Left    LEFT HEART CATH AND CORONARY ANGIOGRAPHY Left 01/02/2019   Procedure: LEFT HEART CATH AND CORONARY ANGIOGRAPHY;  Surgeon: Laurier Nancy, MD;  Location: [facility] INVASIVE CV LAB;  Service: Cardiovascular;  Laterality: Left;    Social History   Socioeconomic History   Marital status: Single    Spouse name: Not on file   Number of children: Not on file   Years of education: Not on file   Highest education level: Not on file  Occupational History   Not on file  Tobacco Use   Smoking status: Former    Current packs/day: 2.00    Average packs/day: 2.0 packs/day for 20.0 years (40.0 ttl pk-yrs)    Types: Cigarettes   Smokeless tobacco:  Never   Tobacco comments:    quit 1946  Vaping Use   Vaping status: Never Used  Substance and Sexual Activity   Alcohol use: No   Drug use: Not Currently    Types: Marijuana   Sexual activity: Not on file  Other Topics Concern   Not on file  Social History Narrative   Not on file   Social Determinants of Health   Financial Resource Strain: Not on file  Food Insecurity: Not on file  Transportation Needs: Not on file  Physical Activity: Not on file  Stress: Not on file  Social Connections: Not on file  Intimate Partner Violence: Not on file    Family History  Problem Relation Age of Onset   Heart disease Mother    Cancer Father    Stroke Father    Heart disease Father     No Known Allergies  Review of Systems  Constitutional:  Negative for weight loss (gained 1 lbs).  HENT: Negative.    Eyes: Negative.   Respiratory: Negative.    Cardiovascular: Negative.  Negative for chest pain and palpitations.  Gastrointestinal: Negative.   Genitourinary: Negative.   Musculoskeletal: Negative.   Skin: Negative.   Neurological: Negative.   Endo/Heme/Allergies: Negative.        Objective:   BP 120/75   Pulse 69   Ht 5'  11" (1.803 m)   Wt 145 lb 9.6 oz (66 kg)   SpO2 99%   BMI 20.31 kg/m   Vitals:   05/17/23 1120  BP: 120/75  Pulse: 69  Height: 5\' 11"  (1.803 m)  Weight: 145 lb 9.6 oz (66 kg)  SpO2: 99%  BMI (Calculated): 20.32    Physical Exam Vitals reviewed.  Constitutional:      Appearance: Normal appearance.  HENT:     Head: Normocephalic.     Left Ear: There is no impacted cerumen.     Nose: Nose normal.     Mouth/Throat:     Mouth: Mucous membranes are moist.     Pharynx: No posterior oropharyngeal erythema.  Eyes:     Extraocular Movements: Extraocular movements intact.     Pupils: Pupils are equal, round, and reactive to light.  Cardiovascular:     Rate and Rhythm: Regular rhythm.     Chest Wall: PMI is not displaced.     Pulses: Normal  pulses.     Heart sounds: Normal heart sounds. No murmur heard. Pulmonary:     Effort: Pulmonary effort is normal.     Breath sounds: Normal air entry. No rhonchi or rales.  Abdominal:     General: Abdomen is flat. Bowel sounds are normal. There is no distension.     Palpations: Abdomen is soft. There is no hepatomegaly, splenomegaly or mass.     Tenderness: There is no abdominal tenderness.  Musculoskeletal:        General: Normal range of motion.     Cervical back: Normal range of motion and neck supple.     Right lower leg: No edema.     Left lower leg: No edema.  Skin:    General: Skin is warm and dry.  Neurological:     General: No focal deficit present.     Mental Status: He is alert and oriented to person, place, and time.     Cranial Nerves: No cranial nerve deficit.     Motor: No weakness.  Psychiatric:        Mood and Affect: Mood normal.        Behavior: Behavior normal.      Results for orders placed or performed in visit on 05/17/23  POCT CBG (Fasting - Glucose)  Result Value Ref Range   Glucose Fasting, POC 136 (A) 70 - 99 mg/dL    Recent Results (from the past 2160 hour(Dreanna Kyllo))  CK     Status: None   Collection Time: 03/29/23  9:20 AM  Result Value Ref Range   Total CK 42 41 - 331 U/L  Hemoglobin A1c     Status: Abnormal   Collection Time: 03/29/23  9:20 AM  Result Value Ref Range   Hgb A1c MFr Bld 7.9 (H) 4.8 - 5.6 %    Comment:          Prediabetes: 5.7 - 6.4          Diabetes: >6.4          Glycemic control for adults with diabetes: <7.0    Est. average glucose Bld gHb Est-mCnc 180 mg/dL  Lipid panel     Status: None   Collection Time: 03/29/23  9:20 AM  Result Value Ref Range   Cholesterol, Total 104 100 - 199 mg/dL   Triglycerides 71 0 - 149 mg/dL   HDL 46 >57 mg/dL   VLDL Cholesterol Cal 15 5 - 40 mg/dL   LDL  Chol Calc (NIH) 43 0 - 99 mg/dL   Chol/HDL Ratio 2.3 0.0 - 5.0 ratio    Comment:                                   T. Chol/HDL Ratio                                              Men  Women                               1/2 Avg.Risk  3.4    3.3                                   Avg.Risk  5.0    4.4                                2X Avg.Risk  9.6    7.1                                3X Avg.Risk 23.4   11.0   Hepatic function panel     Status: None   Collection Time: 03/29/23  9:20 AM  Result Value Ref Range   Bilirubin, Direct 0.11 0.00 - 0.40 mg/dL  CBC With Diff/Platelet     Status: Abnormal   Collection Time: 03/29/23  9:20 AM  Result Value Ref Range   WBC 5.4 3.4 - 10.8 x10E3/uL   RBC 4.49 4.14 - 5.80 x10E6/uL   Hemoglobin 12.9 (L) 13.0 - 17.7 g/dL   Hematocrit 29.5 28.4 - 51.0 %   MCV 92 79 - 97 fL   MCH 28.7 26.6 - 33.0 pg   MCHC 31.3 (L) 31.5 - 35.7 g/dL   RDW 13.2 44.0 - 10.2 %   Platelets 175 150 - 450 x10E3/uL   Neutrophils 52 Not Estab. %   Lymphs 38 Not Estab. %   Monocytes 8 Not Estab. %   Eos 2 Not Estab. %   Basos 0 Not Estab. %   Neutrophils Absolute 2.8 1.4 - 7.0 x10E3/uL   Lymphocytes Absolute 2.0 0.7 - 3.1 x10E3/uL   Monocytes Absolute 0.4 0.1 - 0.9 x10E3/uL   EOS (ABSOLUTE) 0.1 0.0 - 0.4 x10E3/uL   Basophils Absolute 0.0 0.0 - 0.2 x10E3/uL   Immature Granulocytes 0 Not Estab. %   Immature Grans (Abs) 0.0 0.0 - 0.1 x10E3/uL  Comprehensive metabolic panel     Status: Abnormal   Collection Time: 03/29/23  9:20 AM  Result Value Ref Range   Glucose 173 (H) 70 - 99 mg/dL   BUN 23 8 - 27 mg/dL   Creatinine, Ser 7.25 0.76 - 1.27 mg/dL   eGFR 99 >36 UY/QIH/4.74   BUN/Creatinine Ratio 28 (H) 10 - 24   Sodium 144 134 - 144 mmol/L   Potassium 4.7 3.5 - 5.2 mmol/L   Chloride 107 (H) 96 - 106 mmol/L   CO2 22 20 - 29 mmol/L   Calcium 9.3 8.6 - 10.2 mg/dL   Total Protein 6.3 6.0 -  8.5 g/dL   Albumin 4.2 3.9 - 4.9 g/dL   Globulin, Total 2.1 1.5 - 4.5 g/dL   Bilirubin Total 0.3 0.0 - 1.2 mg/dL   Alkaline Phosphatase 74 44 - 121 IU/L   AST 22 0 - 40 IU/L   ALT 32 0 - 44 IU/L  POCT CBG (Fasting -  Glucose)     Status: None   Collection Time: 04/05/23  9:47 AM  Result Value Ref Range   Glucose Fasting, POC    POC CREATINE & ALBUMIN,URINE     Status: Normal   Collection Time: 04/05/23  3:02 PM  Result Value Ref Range   Microalbumin Ur, POC 10 mg/L   Creatinine, POC 50 mg/dL   Albumin/Creatinine Ratio, Urine, POC <30   Fructosamine     Status: None   Collection Time: 05/15/23  9:27 AM  Result Value Ref Range   Fructosamine 271 0 - 285 umol/L    Comment: Published reference interval for apparently healthy subjects between age 51 and 25 is 56 - 285 umol/L and in a poorly controlled diabetic population is 228 - 563 umol/L with a mean of 396 umol/L.   Hemoglobin A1c     Status: Abnormal   Collection Time: 05/15/23  9:27 AM  Result Value Ref Range   Hgb A1c MFr Bld 7.7 (H) 4.8 - 5.6 %    Comment:          Prediabetes: 5.7 - 6.4          Diabetes: >6.4          Glycemic control for adults with diabetes: <7.0    Est. average glucose Bld gHb Est-mCnc 174 mg/dL  Lipid panel     Status: Abnormal   Collection Time: 05/15/23  9:27 AM  Result Value Ref Range   Cholesterol, Total 90 (L) 100 - 199 mg/dL   Triglycerides 97 0 - 149 mg/dL   HDL 40 >16 mg/dL   VLDL Cholesterol Cal 19 5 - 40 mg/dL   LDL Chol Calc (NIH) 31 0 - 99 mg/dL   Chol/HDL Ratio 2.3 0.0 - 5.0 ratio    Comment:                                   T. Chol/HDL Ratio                                             Men  Women                               1/2 Avg.Risk  3.4    3.3                                   Avg.Risk  5.0    4.4                                2X Avg.Risk  9.6    7.1  3X Avg.Risk 23.4   11.0   POCT CBG (Fasting - Glucose)     Status: Abnormal   Collection Time: 05/17/23 11:22 AM  Result Value Ref Range   Glucose Fasting, POC 136 (A) 70 - 99 mg/dL      Assessment & Plan:  As per problem list. Add sulfonylurea. Stricter low calorie diet, low cholesterol and low fat diet  and exercise as much as possible.  Problem List Items Addressed This Visit       Cardiovascular and Mediastinum   Primary hypertension     Endocrine   Diabetes (HCC) - Primary   Relevant Medications   glimepiride (AMARYL) 2 MG tablet   Other Relevant Orders   POCT CBG (Fasting - Glucose) (Completed)   Fructosamine     Other   Mixed hyperlipidemia    Return in about 6 weeks (around 06/28/2023).   Total time spent: 20 minutes  Luna Fuse, MD  05/17/2023   This document may have been prepared by [facility] Voice Recognition software and as such may include unintentional dictation errors.

## 2023-06-17 ENCOUNTER — Other Ambulatory Visit: Payer: Self-pay | Admitting: Internal Medicine

## 2023-06-28 ENCOUNTER — Other Ambulatory Visit: Payer: Self-pay

## 2023-06-28 ENCOUNTER — Other Ambulatory Visit: Payer: 59

## 2023-06-28 DIAGNOSIS — E119 Type 2 diabetes mellitus without complications: Secondary | ICD-10-CM | POA: Diagnosis not present

## 2023-06-29 LAB — FRUCTOSAMINE: Fructosamine: 247 umol/L (ref 0–285)

## 2023-07-01 ENCOUNTER — Other Ambulatory Visit: Payer: Self-pay | Admitting: Internal Medicine

## 2023-07-01 DIAGNOSIS — E119 Type 2 diabetes mellitus without complications: Secondary | ICD-10-CM

## 2023-07-05 ENCOUNTER — Ambulatory Visit (INDEPENDENT_AMBULATORY_CARE_PROVIDER_SITE_OTHER): Payer: 59 | Admitting: Internal Medicine

## 2023-07-05 ENCOUNTER — Encounter: Payer: Self-pay | Admitting: Internal Medicine

## 2023-07-05 VITALS — BP 110/72 | HR 70 | Ht 71.0 in | Wt 147.2 lb

## 2023-07-05 DIAGNOSIS — E119 Type 2 diabetes mellitus without complications: Secondary | ICD-10-CM

## 2023-07-05 DIAGNOSIS — E782 Mixed hyperlipidemia: Secondary | ICD-10-CM | POA: Diagnosis not present

## 2023-07-05 LAB — POCT CBG (FASTING - GLUCOSE)-MANUAL ENTRY: Glucose Fasting, POC: 178 mg/dL — AB (ref 70–99)

## 2023-07-05 NOTE — Progress Notes (Unsigned)
Established Patient Office Visit  Subjective:  Patient ID: Michael Wall, male    DOB: 1960/08/26  Age: 62 y.o. MRN: [id number]  Chief Complaint  Patient presents with   Follow-up    No new complaints, here for lab review and medication refills. Labs reviewed and notable for well controlled diabetes, fructosamine at target. Denies any hypoglycemic episodes but home bg readings have been at target in the evening but only 35% of fasting readings were at target.     No other concerns at this time.   Past Medical History:  Diagnosis Date   Arthritis    Coronary artery disease    Diabetes (HCC)    Dyspnea    GERD (gastroesophageal reflux disease)    HBP (high blood pressure)    Myocardial infarction (HCC)    Schizophrenia (HCC)     Past Surgical History:  Procedure Laterality Date   CARDIAC CATHETERIZATION     COLONOSCOPY WITH PROPOFOL N/A 05/27/2017   Procedure: COLONOSCOPY WITH PROPOFOL;  Surgeon: Scot Jun, MD;  Location: [facility] ENDOSCOPY;  Service: Endoscopy;  Laterality: N/A;   COLONOSCOPY WITH PROPOFOL N/A 10/21/2017   Procedure: COLONOSCOPY WITH PROPOFOL;  Surgeon: Scot Jun, MD;  Location: [facility] ENDOSCOPY;  Service: Endoscopy;  Laterality: N/A;   CORONARY ANGIOPLASTY     FINGER AMPUTATION Left    LEFT HEART CATH AND CORONARY ANGIOGRAPHY Left 01/02/2019   Procedure: LEFT HEART CATH AND CORONARY ANGIOGRAPHY;  Surgeon: Laurier Nancy, MD;  Location: [facility] INVASIVE CV LAB;  Service: Cardiovascular;  Laterality: Left;    Social History   Socioeconomic History   Marital status: Single    Spouse name: Not on file   Number of children: Not on file   Years of education: Not on file   Highest education level: Not on file  Occupational History   Not on file  Tobacco Use   Smoking status: Former    Current packs/day: 2.00    Average packs/day: 2.0 packs/day for 20.0 years (40.0 ttl pk-yrs)    Types: Cigarettes   Smokeless tobacco: Never   Tobacco  comments:    quit 1946  Vaping Use   Vaping status: Never Used  Substance and Sexual Activity   Alcohol use: No   Drug use: Not Currently    Types: Marijuana   Sexual activity: Not on file  Other Topics Concern   Not on file  Social History Narrative   Not on file   Social Determinants of Health   Financial Resource Strain: Not on file  Food Insecurity: Not on file  Transportation Needs: Not on file  Physical Activity: Not on file  Stress: Not on file  Social Connections: Not on file  Intimate Partner Violence: Not on file    Family History  Problem Relation Age of Onset   Heart disease Mother    Cancer Father    Stroke Father    Heart disease Father     No Known Allergies  Outpatient Medications Prior to Visit  Medication Sig   acetaminophen (TYLENOL) 325 MG tablet Take 2 tablets (650 mg total) by mouth every 8 (eight) hours as needed.   aspirin EC (ASPIRIN LOW DOSE) 81 MG tablet TAKE 1 TABLET BY MOUTH DAILY   benztropine (COGENTIN) 2 MG tablet Take 2 mg by mouth 2 (two) times daily.   clopidogrel (PLAVIX) 75 MG tablet TAKE 1 TABLET BY MOUTH DAILY   empagliflozin (JARDIANCE) 25 MG TABS tablet Take 1 tablet (25  mg total) by mouth every morning.   glimepiride (AMARYL) 2 MG tablet Take 1 tablet (2 mg total) by mouth every morning.   glucose blood (ONETOUCH ULTRA) test strip USE WITH DEVICE TO CHECK BLOOD SUGARS TWICE DAILY   haloperidol (HALDOL) 10 MG tablet Take 10 mg by mouth 3 (three) times daily.    losartan (COZAAR) 50 MG tablet Take 1 tablet (50 mg total) by mouth daily.   metFORMIN (GLUCOPHAGE) 850 MG tablet TAKE 1 TABLET BY MOUTH TWICE A DAY WITH A MEAL   metoprolol succinate (TOPROL-XL) 100 MG 24 hr tablet TAKE 1 TABLET BY MOUTH DAILY   mirtazapine (REMERON) 45 MG tablet Take 45 mg by mouth at bedtime.    Multiple Vitamin (MULTIVITAMIN) tablet Take 1 tablet by mouth daily.   pantoprazole (PROTONIX) 40 MG tablet Take 1 tablet (40 mg total) by mouth daily.    QUEtiapine (SEROQUEL) 100 MG tablet Take 150 mg by mouth at bedtime.   rosuvastatin (CRESTOR) 40 MG tablet Take 1 tablet (40 mg total) by mouth every evening.   Facility-Administered Medications Prior to Visit  Medication Dose Route Frequency Provider   sodium chloride flush (NS) 0.9 % injection 3 mL  3 mL Intravenous Q12H Caroleen Hamman, FNP    Review of Systems  Constitutional:  Negative for weight loss (gained 2 lbs).  HENT: Negative.    Eyes: Negative.   Respiratory: Negative.    Cardiovascular: Negative.  Negative for chest pain and palpitations.  Gastrointestinal: Negative.   Genitourinary: Negative.   Musculoskeletal: Negative.   Skin: Negative.   Neurological: Negative.   Endo/Heme/Allergies: Negative.        Objective:   BP 110/72   Pulse 70   Wt 147 lb 3.2 oz (66.8 kg)   SpO2 99%   BMI 20.53 kg/m   Vitals:   07/05/23 1112  BP: 110/72  Pulse: 70  Weight: 147 lb 3.2 oz (66.8 kg)  SpO2: 99%    Physical Exam Vitals reviewed.  Constitutional:      Appearance: Normal appearance.  HENT:     Head: Normocephalic.     Left Ear: There is no impacted cerumen.     Nose: Nose normal.     Mouth/Throat:     Mouth: Mucous membranes are moist.     Pharynx: No posterior oropharyngeal erythema.  Eyes:     Extraocular Movements: Extraocular movements intact.     Pupils: Pupils are equal, round, and reactive to light.  Cardiovascular:     Rate and Rhythm: Regular rhythm.     Chest Wall: PMI is not displaced.     Pulses: Normal pulses.     Heart sounds: Normal heart sounds. No murmur heard. Pulmonary:     Effort: Pulmonary effort is normal.     Breath sounds: Normal air entry. No rhonchi or rales.  Abdominal:     General: Abdomen is flat. Bowel sounds are normal. There is no distension.     Palpations: Abdomen is soft. There is no hepatomegaly, splenomegaly or mass.     Tenderness: There is no abdominal tenderness.  Musculoskeletal:        General: Normal  range of motion.     Cervical back: Normal range of motion and neck supple.     Right lower leg: No edema.     Left lower leg: No edema.  Skin:    General: Skin is warm and dry.  Neurological:     General: No focal deficit present.  Mental Status: He is alert and oriented to person, place, and time.     Cranial Nerves: No cranial nerve deficit.     Motor: No weakness.  Psychiatric:        Mood and Affect: Mood normal.        Behavior: Behavior normal.      Results for orders placed or performed in visit on 07/05/23  POCT CBG (Fasting - Glucose)  Result Value Ref Range   Glucose Fasting, POC 178 (A) 70 - 99 mg/dL    Recent Results (from the past 2160 hour(Emmit Oriley))  Fructosamine     Status: None   Collection Time: 05/15/23  9:27 AM  Result Value Ref Range   Fructosamine 271 0 - 285 umol/L    Comment: Published reference interval for apparently healthy subjects between age 8 and 74 is 30 - 285 umol/L and in a poorly controlled diabetic population is 228 - 563 umol/L with a mean of 396 umol/L.   Hemoglobin A1c     Status: Abnormal   Collection Time: 05/15/23  9:27 AM  Result Value Ref Range   Hgb A1c MFr Bld 7.7 (H) 4.8 - 5.6 %    Comment:          Prediabetes: 5.7 - 6.4          Diabetes: >6.4          Glycemic control for adults with diabetes: <7.0    Est. average glucose Bld gHb Est-mCnc 174 mg/dL  Lipid panel     Status: Abnormal   Collection Time: 05/15/23  9:27 AM  Result Value Ref Range   Cholesterol, Total 90 (L) 100 - 199 mg/dL   Triglycerides 97 0 - 149 mg/dL   HDL 40 >36 mg/dL   VLDL Cholesterol Cal 19 5 - 40 mg/dL   LDL Chol Calc (NIH) 31 0 - 99 mg/dL   Chol/HDL Ratio 2.3 0.0 - 5.0 ratio    Comment:                                   T. Chol/HDL Ratio                                             Men  Women                               1/2 Avg.Risk  3.4    3.3                                   Avg.Risk  5.0    4.4                                2X  Avg.Risk  9.6    7.1                                3X Avg.Risk 23.4   11.0   POCT CBG (Fasting - Glucose)     Status: Abnormal   Collection Time: 05/17/23 11:22 AM  Result  Value Ref Range   Glucose Fasting, POC 136 (A) 70 - 99 mg/dL  Fructosamine     Status: None   Collection Time: 06/28/23  9:34 AM  Result Value Ref Range   Fructosamine 247 0 - 285 umol/L    Comment: Published reference interval for apparently healthy subjects between age 31 and 2 is 86 - 285 umol/L and in a poorly controlled diabetic population is 228 - 563 umol/L with a mean of 396 umol/L.   POCT CBG (Fasting - Glucose)     Status: Abnormal   Collection Time: 07/05/23 11:17 AM  Result Value Ref Range   Glucose Fasting, POC 178 (A) 70 - 99 mg/dL      Assessment & Plan:  As per problem list. Stricter low calorie diet, low cholesterol and low fat diet and exercise as much as possible. Problem List Items Addressed This Visit       Endocrine   Diabetes (HCC) - Primary   Relevant Orders   POCT CBG (Fasting - Glucose) (Completed)   Hemoglobin A1c     Other   Mixed hyperlipidemia   Relevant Orders   Lipid panel    Return in about 6 weeks (around 08/16/2023) for fu with labs prior.   Total time spent: 20 minutes  Luna Fuse, MD  07/05/2023   This document may have been prepared by [facility] Voice Recognition software and as such may include unintentional dictation errors.

## 2023-07-25 ENCOUNTER — Ambulatory Visit (INDEPENDENT_AMBULATORY_CARE_PROVIDER_SITE_OTHER): Payer: 59 | Admitting: Podiatry

## 2023-07-25 DIAGNOSIS — E119 Type 2 diabetes mellitus without complications: Secondary | ICD-10-CM | POA: Diagnosis not present

## 2023-07-25 DIAGNOSIS — B351 Tinea unguium: Secondary | ICD-10-CM

## 2023-07-25 DIAGNOSIS — M79609 Pain in unspecified limb: Secondary | ICD-10-CM | POA: Diagnosis not present

## 2023-07-25 NOTE — Progress Notes (Signed)
  Subjective:  Patient ID: Michael Wall, male    DOB: 04/10/1961,  MRN: [id number]  62 y.o. male presents preventative diabetic foot care and painful thick toenails that are difficult to trim. Pain interferes with ambulation. Aggravating factors include wearing enclosed shoe gear. Pain is relieved with periodic professional debridement.  New problem(s): None   PCP is Sherron Monday, MD , and last visit was July 05, 2023.  No Known Allergies  Review of Systems: Negative except as noted in the HPI.   Objective:  ANJEL GRAUL is a pleasant 62 y.o. male WD, WN in NAD. AAO x 3.  Vascular Examination: Vascular status intact b/l with palpable pedal pulses. CFT immediate b/l. Pedal hair present. No edema. No pain with calf compression b/l. Skin temperature gradient WNL b/l. No varicosities noted. No cyanosis or clubbing noted.  Neurological Examination: Sensation grossly intact b/l with 10 gram monofilament. Vibratory sensation intact b/l.  Dermatological Examination: Pedal skin with normal turgor, texture and tone b/l. No open wounds nor interdigital macerations noted. Toenails 1-5 b/l thick, discolored, elongated with subungual debris and pain on dorsal palpation. No hyperkeratotic lesions noted b/l.   Musculoskeletal Examination: Muscle strength 5/5 to b/l LE.  No pain, crepitus noted b/l. Hammertoe(s) noted to the L 2nd toe and R 2nd toe. Patient ambulates independently without assistive aids.   Radiographs: None  Last A1c:      Latest Ref Rng & Units 05/15/2023    9:27 AM 03/29/2023    9:20 AM 12/28/2022    8:57 AM 09/27/2022    9:00 AM  Hemoglobin A1C  Hemoglobin-A1c 4.8 - 5.6 % 7.7  7.9  5.9  6.1      Assessment:   1. Pain due to onychomycosis of nail   2. Diabetes mellitus without complication (HCC)    Plan:  -Patient was evaluated today. All questions/concerns addressed on today's visit. -Patient to continue soft, supportive shoe gear daily. -Toenails  1-5 b/l were debrided in length and girth with sterile nail nippers and dremel without iatrogenic bleeding.  -Patient/POA to call should there be question/concern in the interim.  Return in about 3 months (around 10/23/2023).  Freddie Breech, DPM      [place] LOCATION: [address] [place] [address]                   Office [phone]   [facility] LOCATION: [address] [place], [place] [address] Office [phone]

## 2023-07-29 ENCOUNTER — Other Ambulatory Visit: Payer: Self-pay | Admitting: Internal Medicine

## 2023-08-05 ENCOUNTER — Encounter: Payer: Self-pay | Admitting: Cardiovascular Disease

## 2023-08-05 ENCOUNTER — Ambulatory Visit (INDEPENDENT_AMBULATORY_CARE_PROVIDER_SITE_OTHER): Payer: 59 | Admitting: Cardiovascular Disease

## 2023-08-05 VITALS — BP 118/72 | HR 71 | Ht 71.0 in | Wt 145.0 lb

## 2023-08-05 DIAGNOSIS — I10 Essential (primary) hypertension: Secondary | ICD-10-CM | POA: Diagnosis not present

## 2023-08-05 DIAGNOSIS — E782 Mixed hyperlipidemia: Secondary | ICD-10-CM | POA: Diagnosis not present

## 2023-08-05 DIAGNOSIS — I2510 Atherosclerotic heart disease of native coronary artery without angina pectoris: Secondary | ICD-10-CM

## 2023-08-05 NOTE — Progress Notes (Signed)
Cardiology Office Note   Date:  08/05/2023   ID:  Michael Wall, DOB September 27, 1960, MRN [id number]  PCP:  Sherron Monday, MD  Cardiologist:  Adrian Blackwater, MD      History of Present Illness: Michael Wall is a 62 y.o. male who presents for No chief complaint on file.   No complaints, no chest pain or SOB      Past Medical History:  Diagnosis Date   Arthritis    Coronary artery disease    Diabetes (HCC)    Dyspnea    GERD (gastroesophageal reflux disease)    HBP (high blood pressure)    Myocardial infarction (HCC)    Schizophrenia (HCC)      Past Surgical History:  Procedure Laterality Date   CARDIAC CATHETERIZATION     COLONOSCOPY WITH PROPOFOL N/A 05/27/2017   Procedure: COLONOSCOPY WITH PROPOFOL;  Surgeon: Scot Jun, MD;  Location: [facility] ENDOSCOPY;  Service: Endoscopy;  Laterality: N/A;   COLONOSCOPY WITH PROPOFOL N/A 10/21/2017   Procedure: COLONOSCOPY WITH PROPOFOL;  Surgeon: Scot Jun, MD;  Location: [facility] ENDOSCOPY;  Service: Endoscopy;  Laterality: N/A;   CORONARY ANGIOPLASTY     FINGER AMPUTATION Left    LEFT HEART CATH AND CORONARY ANGIOGRAPHY Left 01/02/2019   Procedure: LEFT HEART CATH AND CORONARY ANGIOGRAPHY;  Surgeon: Laurier Nancy, MD;  Location: [facility] INVASIVE CV LAB;  Service: Cardiovascular;  Laterality: Left;     Current Outpatient Medications  Medication Sig Dispense Refill   acetaminophen (TYLENOL) 325 MG tablet Take 2 tablets (650 mg total) by mouth every 8 (eight) hours as needed.     aspirin EC (ASPIRIN LOW DOSE) 81 MG tablet TAKE 1 TABLET BY MOUTH DAILY 30 tablet 11   benztropine (COGENTIN) 2 MG tablet Take 2 mg by mouth 2 (two) times daily.     clopidogrel (PLAVIX) 75 MG tablet TAKE 1 TABLET BY MOUTH DAILY 30 tablet 3   empagliflozin (JARDIANCE) 25 MG TABS tablet Take 1 tablet (25 mg total) by mouth every morning. 90 tablet 1   glimepiride (AMARYL) 2 MG tablet Take 1 tablet (2 mg total) by mouth every  morning. 30 tablet 2   haloperidol (HALDOL) 10 MG tablet Take 10 mg by mouth 3 (three) times daily.      losartan (COZAAR) 50 MG tablet Take 1 tablet (50 mg total) by mouth daily. 90 tablet 2   metFORMIN (GLUCOPHAGE) 850 MG tablet TAKE 1 TABLET BY MOUTH TWICE A DAY WITH A MEAL 60 tablet 3   metoprolol succinate (TOPROL-XL) 100 MG 24 hr tablet TAKE 1 TABLET BY MOUTH DAILY 90 tablet 3   mirtazapine (REMERON) 45 MG tablet Take 45 mg by mouth at bedtime.      Multiple Vitamin (MULTIVITAMIN) tablet Take 1 tablet by mouth daily. 30 tablet 11   ONETOUCH ULTRA test strip USE WITH DEVICE TO CHECK BLOOD SUGARS TWICE DAILY 100 each 3   pantoprazole (PROTONIX) 40 MG tablet Take 1 tablet (40 mg total) by mouth daily. 30 tablet 11   QUEtiapine (SEROQUEL) 100 MG tablet Take 150 mg by mouth at bedtime.     rosuvastatin (CRESTOR) 40 MG tablet Take 1 tablet (40 mg total) by mouth every evening. 30 tablet 5   No current facility-administered medications for this visit.   Facility-Administered Medications Ordered in Other Visits  Medication Dose Route Frequency Provider Last Rate Last Admin   sodium chloride flush (NS) 0.9 % injection 3 mL  3  mL Intravenous Q12H Caroleen Hamman, FNP        Allergies:   Patient has no known allergies.    Social History:   reports that he has quit smoking. His smoking use included cigarettes. He has a 40 pack-year smoking history. He has never used smokeless tobacco. He reports that he does not currently use drugs after having used the following drugs: Marijuana. He reports that he does not drink alcohol.   Family History:  family history includes Cancer in his father; Heart disease in his father and mother; Stroke in his father.    ROS:     Review of Systems  Constitutional: Negative.   HENT: Negative.    Eyes: Negative.   Respiratory: Negative.    Gastrointestinal: Negative.   Genitourinary: Negative.   Musculoskeletal: Negative.   Skin: Negative.    Neurological: Negative.   Endo/Heme/Allergies: Negative.   Psychiatric/Behavioral: Negative.    All other systems reviewed and are negative.     All other systems are reviewed and negative.    PHYSICAL EXAM: VS:  BP 118/72   Pulse 71   Wt 145 lb (65.8 kg)   SpO2 98%   BMI 20.22 kg/m  , BMI Body mass index is 20.22 kg/m. Last weight:  Wt Readings from Last 3 Encounters:  08/05/23 145 lb (65.8 kg)  07/05/23 147 lb 3.2 oz (66.8 kg)  05/17/23 145 lb 9.6 oz (66 kg)     Physical Exam Vitals reviewed.  Constitutional:      Appearance: Normal appearance. He is normal weight.  HENT:     Head: Normocephalic.     Nose: Nose normal.     Mouth/Throat:     Mouth: Mucous membranes are moist.  Eyes:     Pupils: Pupils are equal, round, and reactive to light.  Cardiovascular:     Rate and Rhythm: Normal rate and regular rhythm.     Pulses: Normal pulses.     Heart sounds: Normal heart sounds.  Pulmonary:     Effort: Pulmonary effort is normal.  Abdominal:     General: Abdomen is flat. Bowel sounds are normal.  Musculoskeletal:        General: Normal range of motion.     Cervical back: Normal range of motion.  Skin:    General: Skin is warm.  Neurological:     General: No focal deficit present.     Mental Status: He is alert.  Psychiatric:        Mood and Affect: Mood normal.       EKG:   Recent Labs: 12/28/2022: TSH 2.560 03/29/2023: ALT 32; BUN 23; Creatinine, Ser 0.82; Hemoglobin 12.9; Platelets 175; Potassium 4.7; Sodium 144    Lipid Panel    Component Value Date/Time   CHOL 90 (L) 05/15/2023 0927   TRIG 97 05/15/2023 0927   HDL 40 05/15/2023 0927   CHOLHDL 2.3 05/15/2023 0927   LDLCALC 31 05/15/2023 2952      Other studies Reviewed: Additional studies/ records that were reviewed today include:  Review of the above records demonstrates:       No data to display            ASSESSMENT AND PLAN:    ICD-10-CM   1. Coronary artery disease  involving native heart without angina pectoris, unspecified vessel or lesion type  I25.10    No chest pain    2. Mixed hyperlipidemia  E78.2     3. Primary hypertension  I10  4. Two-vessel coronary artery disease  I25.10        Problem List Items Addressed This Visit       Cardiovascular and Mediastinum   Coronary artery disease - Primary   Primary hypertension   Two-vessel coronary artery disease     Other   Mixed hyperlipidemia       Disposition:   Return in about 3 months (around 11/03/2023).    Total time spent: 30 minutes  Signed,  Adrian Blackwater, MD  08/05/2023 10:04 AM    [facility]

## 2023-08-12 ENCOUNTER — Other Ambulatory Visit: Payer: 59

## 2023-08-12 ENCOUNTER — Other Ambulatory Visit: Payer: Self-pay | Admitting: Internal Medicine

## 2023-08-12 DIAGNOSIS — E119 Type 2 diabetes mellitus without complications: Secondary | ICD-10-CM

## 2023-08-16 ENCOUNTER — Ambulatory Visit: Payer: 59 | Admitting: Internal Medicine

## 2023-08-16 ENCOUNTER — Other Ambulatory Visit: Payer: 59

## 2023-08-16 DIAGNOSIS — E782 Mixed hyperlipidemia: Secondary | ICD-10-CM | POA: Diagnosis not present

## 2023-08-16 DIAGNOSIS — E119 Type 2 diabetes mellitus without complications: Secondary | ICD-10-CM | POA: Diagnosis not present

## 2023-08-17 LAB — LIPID PANEL
Chol/HDL Ratio: 2.8 {ratio} (ref 0.0–5.0)
Cholesterol, Total: 80 mg/dL — ABNORMAL LOW (ref 100–199)
HDL: 29 mg/dL — ABNORMAL LOW (ref >39)
LDL Chol Calc (NIH): 35 mg/dL (ref 0–99)
Triglycerides: 77 mg/dL (ref 0–149)
VLDL Cholesterol Cal: 16 mg/dL (ref 5–40)

## 2023-08-17 LAB — HEMOGLOBIN A1C
Est. average glucose Bld gHb Est-mCnc: 143 mg/dL
Hgb A1c MFr Bld: 6.6 % — ABNORMAL HIGH (ref 4.8–5.6)

## 2023-08-21 ENCOUNTER — Encounter: Payer: Self-pay | Admitting: Internal Medicine

## 2023-08-21 ENCOUNTER — Ambulatory Visit (INDEPENDENT_AMBULATORY_CARE_PROVIDER_SITE_OTHER): Payer: 59 | Admitting: Internal Medicine

## 2023-08-21 VITALS — BP 118/82 | HR 77 | Temp 98.5°F | Ht 71.0 in | Wt 147.0 lb

## 2023-08-21 DIAGNOSIS — E782 Mixed hyperlipidemia: Secondary | ICD-10-CM

## 2023-08-21 DIAGNOSIS — E119 Type 2 diabetes mellitus without complications: Secondary | ICD-10-CM | POA: Diagnosis not present

## 2023-08-21 DIAGNOSIS — Z0130 Encounter for examination of blood pressure without abnormal findings: Secondary | ICD-10-CM

## 2023-08-21 LAB — POCT CBG (FASTING - GLUCOSE)-MANUAL ENTRY: Glucose Fasting, POC: 107 mg/dL — AB (ref 70–99)

## 2023-08-21 MED ORDER — ROSUVASTATIN CALCIUM 40 MG PO TABS: 40.0000 mg | ORAL_TABLET | Freq: Every evening | ORAL | 5 refills | Status: DC

## 2023-08-21 MED ORDER — EMPAGLIFLOZIN 25 MG PO TABS: 25.0000 mg | ORAL_TABLET | Freq: Every morning | ORAL | 1 refills | Status: DC

## 2023-08-21 NOTE — Progress Notes (Signed)
 Established Patient Office Visit  Subjective:  Patient ID: Michael Wall, male    DOB: [birth year]  Age: 63 y.o. MRN: [id number]  Chief Complaint  Patient presents with   Follow-up    6 week follow up     No new complaints, here for lab review and medication refills. Labs reviewed and notable for well controlled diabetes, A1c at target, lipids also at target. Admits to single hypoglycemic episodes but otherwise home bg readings have been at target.     No other concerns at this time.   Past Medical History:  Diagnosis Date   Arthritis    Coronary artery disease    Diabetes (HCC)    Dyspnea    GERD (gastroesophageal reflux disease)    HBP (high blood pressure)    Myocardial infarction (HCC)    Schizophrenia (HCC)     Past Surgical History:  Procedure Laterality Date   CARDIAC CATHETERIZATION     COLONOSCOPY WITH PROPOFOL  N/A 05/27/2017   Procedure: COLONOSCOPY WITH PROPOFOL ;  Surgeon: Viktoria Lamar DASEN, MD;  Location: [facility] ENDOSCOPY;  Service: Endoscopy;  Laterality: N/A;   COLONOSCOPY WITH PROPOFOL  N/A 10/21/2017   Procedure: COLONOSCOPY WITH PROPOFOL ;  Surgeon: Viktoria Lamar DASEN, MD;  Location: [facility] ENDOSCOPY;  Service: Endoscopy;  Laterality: N/A;   CORONARY ANGIOPLASTY     FINGER AMPUTATION Left    LEFT HEART CATH AND CORONARY ANGIOGRAPHY Left 01/02/2019   Procedure: LEFT HEART CATH AND CORONARY ANGIOGRAPHY;  Surgeon: Fernand Denyse DELENA, MD;  Location: [facility] INVASIVE CV LAB;  Service: Cardiovascular;  Laterality: Left;    Social History   Socioeconomic History   Marital status: Single    Spouse name: Not on file   Number of children: Not on file   Years of education: Not on file   Highest education level: Not on file  Occupational History   Not on file  Tobacco Use   Smoking status: Former    Current packs/day: 2.00    Average packs/day: 2.0 packs/day for 20.0 years (40.0 ttl pk-yrs)    Types: Cigarettes   Smokeless tobacco: Never   Tobacco comments:     quit 1946  Vaping Use   Vaping status: Never Used  Substance and Sexual Activity   Alcohol use: No   Drug use: Not Currently    Types: Marijuana   Sexual activity: Not on file  Other Topics Concern   Not on file  Social History Narrative   Not on file   Social Drivers of Health   Financial Resource Strain: Not on file  Food Insecurity: Not on file  Transportation Needs: Not on file  Physical Activity: Not on file  Stress: Not on file  Social Connections: Not on file  Intimate Partner Violence: Not on file    Family History  Problem Relation Age of Onset   Heart disease Mother    Cancer Father    Stroke Father    Heart disease Father     No Known Allergies  Outpatient Medications Prior to Visit  Medication Sig   acetaminophen  (TYLENOL ) 325 MG tablet Take 2 tablets (650 mg total) by mouth every 8 (eight) hours as needed.   aspirin  EC (ASPIRIN  LOW DOSE) 81 MG tablet TAKE 1 TABLET BY MOUTH DAILY   benztropine  (COGENTIN ) 2 MG tablet Take 2 mg by mouth 2 (two) times daily.   clopidogrel  (PLAVIX ) 75 MG tablet TAKE 1 TABLET BY MOUTH DAILY   glimepiride  (AMARYL ) 2 MG tablet TAKE 1 TABLET  BY MOUTH EVERY MORNING   haloperidol  (HALDOL ) 10 MG tablet Take 10 mg by mouth 3 (three) times daily.    losartan  (COZAAR ) 50 MG tablet Take 1 tablet (50 mg total) by mouth daily.   metFORMIN  (GLUCOPHAGE ) 850 MG tablet TAKE 1 TABLET BY MOUTH TWICE A DAY WITH A MEAL   metoprolol  succinate (TOPROL -XL) 100 MG 24 hr tablet TAKE 1 TABLET BY MOUTH DAILY   mirtazapine  (REMERON ) 45 MG tablet Take 45 mg by mouth at bedtime.    Multiple Vitamin (MULTIVITAMIN) tablet Take 1 tablet by mouth daily.   ONETOUCH ULTRA test strip USE WITH DEVICE TO CHECK BLOOD SUGARS TWICE DAILY   pantoprazole  (PROTONIX ) 40 MG tablet Take 1 tablet (40 mg total) by mouth daily.   QUEtiapine  (SEROQUEL ) 100 MG tablet Take 150 mg by mouth at bedtime.   [DISCONTINUED] empagliflozin  (JARDIANCE ) 25 MG TABS tablet Take 1 tablet (25  mg total) by mouth every morning.   [DISCONTINUED] rosuvastatin  (CRESTOR ) 40 MG tablet Take 1 tablet (40 mg total) by mouth every evening.   Facility-Administered Medications Prior to Visit  Medication Dose Route Frequency Provider   sodium chloride  flush (NS) 0.9 % injection 3 mL  3 mL Intravenous Q12H Cunningham, Kristin, FNP    Review of Systems  Constitutional:  Negative for weight loss (gained 2 lbs).  HENT: Negative.    Eyes: Negative.   Respiratory: Negative.    Cardiovascular: Negative.  Negative for chest pain and palpitations.  Gastrointestinal: Negative.   Genitourinary: Negative.   Musculoskeletal: Negative.   Skin: Negative.   Neurological: Negative.   Endo/Heme/Allergies: Negative.        Objective:   BP 118/82   Pulse 77   Temp 98.5 F (36.9 C)   Ht 5' 11 (1.803 m)   Wt 147 lb (66.7 kg)   SpO2 98%   BMI 20.50 kg/m   Vitals:   08/21/23 1146  BP: 118/82  Pulse: 77  Temp: 98.5 F (36.9 C)  Height: 5' 11 (1.803 m)  Weight: 147 lb (66.7 kg)  SpO2: 98%  BMI (Calculated): 20.51    Physical Exam Vitals reviewed.  Constitutional:      Appearance: Normal appearance.  HENT:     Head: Normocephalic.     Left Ear: There is no impacted cerumen.     Nose: Nose normal.     Mouth/Throat:     Mouth: Mucous membranes are moist.     Pharynx: No posterior oropharyngeal erythema.  Eyes:     Extraocular Movements: Extraocular movements intact.     Pupils: Pupils are equal, round, and reactive to light.  Cardiovascular:     Rate and Rhythm: Regular rhythm.     Chest Wall: PMI is not displaced.     Pulses: Normal pulses.     Heart sounds: Normal heart sounds. No murmur heard. Pulmonary:     Effort: Pulmonary effort is normal.     Breath sounds: Normal air entry. No rhonchi or rales.  Abdominal:     General: Abdomen is flat. Bowel sounds are normal. There is no distension.     Palpations: Abdomen is soft. There is no hepatomegaly, splenomegaly or mass.      Tenderness: There is no abdominal tenderness.  Musculoskeletal:        General: Normal range of motion.     Cervical back: Normal range of motion and neck supple.     Right lower leg: No edema.     Left lower leg:  No edema.  Skin:    General: Skin is warm and dry.  Neurological:     General: No focal deficit present.     Mental Status: He is alert and oriented to person, place, and time.     Cranial Nerves: No cranial nerve deficit.     Motor: No weakness.  Psychiatric:        Mood and Affect: Mood normal.        Behavior: Behavior normal.      No results found for any visits on 08/21/23.  Recent Results (from the past 2160 hours)  Fructosamine     Status: None   Collection Time: 06/28/23  9:34 AM  Result Value Ref Range   Fructosamine 247 0 - 285 umol/L    Comment: Published reference interval for apparently healthy subjects between age 8 and 52 is 100 - 285 umol/L and in a poorly controlled diabetic population is 228 - 563 umol/L with a mean of 396 umol/L.   POCT CBG (Fasting - Glucose)     Status: Abnormal   Collection Time: 07/05/23 11:17 AM  Result Value Ref Range   Glucose Fasting, POC 178 (A) 70 - 99 mg/dL  Lipid panel     Status: Abnormal   Collection Time: 08/16/23  8:55 AM  Result Value Ref Range   Cholesterol, Total 80 (L) 100 - 199 mg/dL   Triglycerides 77 0 - 149 mg/dL   HDL 29 (L) >60 mg/dL   VLDL Cholesterol Cal 16 5 - 40 mg/dL   LDL Chol Calc (NIH) 35 0 - 99 mg/dL   Chol/HDL Ratio 2.8 0.0 - 5.0 ratio    Comment:                                   T. Chol/HDL Ratio                                             Men  Women                               1/2 Avg.Risk  3.4    3.3                                   Avg.Risk  5.0    4.4                                2X Avg.Risk  9.6    7.1                                3X Avg.Risk 23.4   11.0   Hemoglobin A1c     Status: Abnormal   Collection Time: 08/16/23  8:56 AM  Result Value Ref Range   Hgb A1c  MFr Bld 6.6 (H) 4.8 - 5.6 %    Comment:          Prediabetes: 5.7 - 6.4          Diabetes: >6.4          Glycemic  control for adults with diabetes: <7.0    Est. average glucose Bld gHb Est-mCnc 143 mg/dL      Assessment & Plan:  As per problem list  Problem List Items Addressed This Visit       Endocrine   Diabetes (HCC) - Primary   Relevant Medications   empagliflozin  (JARDIANCE ) 25 MG TABS tablet   rosuvastatin  (CRESTOR ) 40 MG tablet   Other Relevant Orders   POCT CBG (Fasting - Glucose)   Hemoglobin A1c     Other   Mixed hyperlipidemia   Relevant Medications   rosuvastatin  (CRESTOR ) 40 MG tablet   Other Relevant Orders   Lipid panel   CK   Hepatic function panel    Return in about 3 months (around 11/19/2023) for fu with labs prior.   Total time spent: 20 minutes  Sherrill Cinderella Perry, MD  08/21/2023   This document may have been prepared by [facility] Voice Recognition software and as such may include unintentional dictation errors.

## 2023-09-23 ENCOUNTER — Other Ambulatory Visit: Payer: Self-pay | Admitting: Cardiovascular Disease

## 2023-10-21 ENCOUNTER — Other Ambulatory Visit: Payer: Self-pay | Admitting: Internal Medicine

## 2023-10-21 DIAGNOSIS — E119 Type 2 diabetes mellitus without complications: Secondary | ICD-10-CM

## 2023-10-24 ENCOUNTER — Encounter: Payer: Self-pay | Admitting: Podiatry

## 2023-10-24 ENCOUNTER — Ambulatory Visit (INDEPENDENT_AMBULATORY_CARE_PROVIDER_SITE_OTHER): Payer: 59 | Admitting: Podiatry

## 2023-10-24 VITALS — Ht 71.0 in | Wt 147.0 lb

## 2023-10-24 DIAGNOSIS — B351 Tinea unguium: Secondary | ICD-10-CM

## 2023-10-24 DIAGNOSIS — M79609 Pain in unspecified limb: Secondary | ICD-10-CM | POA: Diagnosis not present

## 2023-10-24 DIAGNOSIS — E119 Type 2 diabetes mellitus without complications: Secondary | ICD-10-CM | POA: Diagnosis not present

## 2023-10-24 NOTE — Progress Notes (Signed)
  Subjective:  Patient ID: Michael Wall, male    DOB: 1961/04/02,  MRN: [id number]  63 y.o. male presents preventative diabetic foot care and painful, elongated thickened toenails x 10 which are symptomatic when wearing enclosed shoe gear. This interferes with his/her daily activities. He is a resident of [facility]. Chief Complaint  Patient presents with   Nail Problem    Pt is here for [facility] unsure of last A1C PCP is Dr Evalee Mutton and LOV was in January.   New problem(s): None   PCP is Sherron Monday, MD.  No Known Allergies  Review of Systems: Negative except as noted in the HPI.   Objective:  Michael Wall is a pleasant 63 y.o. male WD, WN in NAD. AAO x 3.  Vascular Examination: Vascular status intact b/l with palpable pedal pulses. CFT immediate b/l. Pedal hair present. No edema. No pain with calf compression b/l. Skin temperature gradient WNL b/l. No varicosities noted. No cyanosis or clubbing noted.  Neurological Examination: Sensation grossly intact b/l with 10 gram monofilament. Vibratory sensation intact b/l.  Dermatological Examination: Pedal skin with normal turgor, texture and tone b/l. No open wounds nor interdigital macerations noted. Toenails 1-5 b/l thick, discolored, elongated with subungual debris and pain on dorsal palpation. No hyperkeratotic lesions noted b/l.   Musculoskeletal Examination: Muscle strength 5/5 to b/l LE.  No pain, crepitus noted b/l. Hammertoe(s) bilateral 2nd toes.. Patient ambulates independently without assistive aids.   Radiographs: None  Last A1c:      Latest Ref Rng & Units 08/16/2023    8:56 AM 05/15/2023    9:27 AM 03/29/2023    9:20 AM 12/28/2022    8:57 AM  Hemoglobin A1C  Hemoglobin-A1c 4.8 - 5.6 % 6.6  7.7  7.9  5.9      Assessment:   1. Pain due to onychomycosis of nail   2. Diabetes mellitus without complication (HCC)    Plan:  Consent given for treatment. Patient examined. All patient's and/or POA's  questions/concerns addressed on today's visit. Toenails 1-5 debrided in length and girth without incident. Continue foot and shoe inspections daily. Monitor blood glucose per PCP/Endocrinologist's recommendations. Continue soft, supportive shoe gear daily. Report any pedal injuries to medical professional. Call office if there are any questions/concerns. -Patient/POA to call should there be question/concern in the interim.  Return in about 3 months (around 01/24/2024).  Freddie Breech, DPM      [facility] LOCATION: [address], [place] [address]                   Office [phone]   [facility] LOCATION: [address] [place], [place] [address] Office [phone]

## 2023-10-29 ENCOUNTER — Encounter: Payer: Self-pay | Admitting: Podiatry

## 2023-11-08 ENCOUNTER — Encounter: Payer: Self-pay | Admitting: Cardiovascular Disease

## 2023-11-08 ENCOUNTER — Ambulatory Visit (INDEPENDENT_AMBULATORY_CARE_PROVIDER_SITE_OTHER): Payer: 59 | Admitting: Cardiovascular Disease

## 2023-11-08 VITALS — BP 110/68 | HR 80 | Ht 71.0 in | Wt 150.0 lb

## 2023-11-08 DIAGNOSIS — I2510 Atherosclerotic heart disease of native coronary artery without angina pectoris: Secondary | ICD-10-CM | POA: Diagnosis not present

## 2023-11-08 DIAGNOSIS — I10 Essential (primary) hypertension: Secondary | ICD-10-CM | POA: Diagnosis not present

## 2023-11-08 DIAGNOSIS — E782 Mixed hyperlipidemia: Secondary | ICD-10-CM

## 2023-11-08 NOTE — Progress Notes (Signed)
 Cardiology Office Note   Date:  11/08/2023   ID:  Michael Wall, DOB 1960-12-22, MRN [id number]  PCP:  Sherron Monday, MD  Cardiologist:  Adrian Blackwater, MD      History of Present Illness: Michael Wall is a 63 y.o. male who presents for  Chief Complaint  Patient presents with   Follow-up    3 month follow up     Doing good      Past Medical History:  Diagnosis Date   Arthritis    Coronary artery disease    Diabetes (HCC)    Dyspnea    GERD (gastroesophageal reflux disease)    HBP (high blood pressure)    Myocardial infarction (HCC)    Schizophrenia (HCC)      Past Surgical History:  Procedure Laterality Date   CARDIAC CATHETERIZATION     COLONOSCOPY WITH PROPOFOL N/A 05/27/2017   Procedure: COLONOSCOPY WITH PROPOFOL;  Surgeon: Scot Jun, MD;  Location: [facility] ENDOSCOPY;  Service: Endoscopy;  Laterality: N/A;   COLONOSCOPY WITH PROPOFOL N/A 10/21/2017   Procedure: COLONOSCOPY WITH PROPOFOL;  Surgeon: Scot Jun, MD;  Location: [facility] ENDOSCOPY;  Service: Endoscopy;  Laterality: N/A;   CORONARY ANGIOPLASTY     FINGER AMPUTATION Left    LEFT HEART CATH AND CORONARY ANGIOGRAPHY Left 01/02/2019   Procedure: LEFT HEART CATH AND CORONARY ANGIOGRAPHY;  Surgeon: Laurier Nancy, MD;  Location: [facility] INVASIVE CV LAB;  Service: Cardiovascular;  Laterality: Left;     Current Outpatient Medications  Medication Sig Dispense Refill   acetaminophen (TYLENOL) 325 MG tablet Take 2 tablets (650 mg total) by mouth every 8 (eight) hours as needed.     aspirin EC (ASPIRIN LOW DOSE) 81 MG tablet TAKE 1 TABLET BY MOUTH DAILY 30 tablet 11   benztropine (COGENTIN) 2 MG tablet Take 2 mg by mouth 2 (two) times daily.     clopidogrel (PLAVIX) 75 MG tablet TAKE 1 TABLET BY MOUTH DAILY 30 tablet 3   empagliflozin (JARDIANCE) 25 MG TABS tablet Take 1 tablet (25 mg total) by mouth every morning. 90 tablet 1   glimepiride (AMARYL) 2 MG tablet TAKE 1 TABLET BY MOUTH  EVERY MORNING 30 tablet 3   haloperidol (HALDOL) 10 MG tablet Take 10 mg by mouth 3 (three) times daily.      losartan (COZAAR) 50 MG tablet TAKE 1 TABLET BY MOUTH DAILY 90 tablet 2   metFORMIN (GLUCOPHAGE) 850 MG tablet TAKE 1 TABLET BY MOUTH TWICE A DAY WITH A MEAL 60 tablet 1   metoprolol succinate (TOPROL-XL) 100 MG 24 hr tablet TAKE 1 TABLET BY MOUTH DAILY 90 tablet 3   mirtazapine (REMERON) 45 MG tablet Take 45 mg by mouth at bedtime.      Multiple Vitamin (MULTIVITAMIN) tablet Take 1 tablet by mouth daily. 30 tablet 11   ONETOUCH ULTRA test strip USE WITH DEVICE TO CHECK BLOOD SUGARS TWICE DAILY 100 each 3   pantoprazole (PROTONIX) 40 MG tablet Take 1 tablet (40 mg total) by mouth daily. 30 tablet 11   QUEtiapine (SEROQUEL) 100 MG tablet Take 150 mg by mouth at bedtime.     rosuvastatin (CRESTOR) 40 MG tablet Take 1 tablet (40 mg total) by mouth every evening. 30 tablet 5   No current facility-administered medications for this visit.   Facility-Administered Medications Ordered in Other Visits  Medication Dose Route Frequency Provider Last Rate Last Admin   sodium chloride flush (NS) 0.9 % injection 3  mL  3 mL Intravenous Q12H Caroleen Hamman, FNP        Allergies:   Patient has no known allergies.    Social History:   reports that he has quit smoking. His smoking use included cigarettes. He has a 40 pack-year smoking history. He has never used smokeless tobacco. He reports that he does not currently use drugs after having used the following drugs: Marijuana. He reports that he does not drink alcohol.   Family History:  family history includes Cancer in his father; Heart disease in his father and mother; Stroke in his father.    ROS:     Review of Systems  Constitutional: Negative.   HENT: Negative.    Eyes: Negative.   Respiratory: Negative.    Gastrointestinal: Negative.   Genitourinary: Negative.   Musculoskeletal: Negative.   Skin: Negative.   Neurological:  Negative.   Endo/Heme/Allergies: Negative.   Psychiatric/Behavioral: Negative.    All other systems reviewed and are negative.     All other systems are reviewed and negative.    PHYSICAL EXAM: VS:  BP 110/68   Pulse 80   Ht 5\' 11"  (1.803 m)   Wt 150 lb (68 kg)   SpO2 98%   BMI 20.92 kg/m  , BMI Body mass index is 20.92 kg/m. Last weight:  Wt Readings from Last 3 Encounters:  11/08/23 150 lb (68 kg)  10/24/23 147 lb (66.7 kg)  08/21/23 147 lb (66.7 kg)     Physical Exam Vitals reviewed.  Constitutional:      Appearance: Normal appearance. He is normal weight.  HENT:     Head: Normocephalic.     Nose: Nose normal.     Mouth/Throat:     Mouth: Mucous membranes are moist.  Eyes:     Pupils: Pupils are equal, round, and reactive to light.  Cardiovascular:     Rate and Rhythm: Normal rate and regular rhythm.     Pulses: Normal pulses.     Heart sounds: Normal heart sounds.  Pulmonary:     Effort: Pulmonary effort is normal.  Abdominal:     General: Abdomen is flat. Bowel sounds are normal.  Musculoskeletal:        General: Normal range of motion.     Cervical back: Normal range of motion.  Skin:    General: Skin is warm.  Neurological:     General: No focal deficit present.     Mental Status: He is alert.  Psychiatric:        Mood and Affect: Mood normal.       EKG:   Recent Labs: 12/28/2022: TSH 2.560 03/29/2023: ALT 32; BUN 23; Creatinine, Ser 0.82; Hemoglobin 12.9; Platelets 175; Potassium 4.7; Sodium 144    Lipid Panel    Component Value Date/Time   CHOL 80 (L) 08/16/2023 0855   TRIG 77 08/16/2023 0855   HDL 29 (L) 08/16/2023 0855   CHOLHDL 2.8 08/16/2023 0855   LDLCALC 35 08/16/2023 0855      Other studies Reviewed: Additional studies/ records that were reviewed today include:  Review of the above records demonstrates:       No data to display            ASSESSMENT AND PLAN:    ICD-10-CM   1. Mixed hyperlipidemia  E78.2      2. Primary hypertension  I10    Normal BP    3. Two-vessel coronary artery disease  I25.10  4. Coronary artery disease involving native heart without angina pectoris, unspecified vessel or lesion type  I25.10    No chest pains       Problem List Items Addressed This Visit       Cardiovascular and Mediastinum   Coronary artery disease   Primary hypertension   Two-vessel coronary artery disease     Other   Mixed hyperlipidemia - Primary       Disposition:   Return in about 3 months (around 02/08/2024).    Total time spent: 30 minutes  Signed,  Adrian Blackwater, MD  11/08/2023 10:15 AM    [facility]

## 2023-11-29 ENCOUNTER — Ambulatory Visit: Payer: 59 | Admitting: Internal Medicine

## 2023-12-06 ENCOUNTER — Other Ambulatory Visit

## 2023-12-06 DIAGNOSIS — E782 Mixed hyperlipidemia: Secondary | ICD-10-CM | POA: Diagnosis not present

## 2023-12-07 LAB — HEPATIC FUNCTION PANEL
ALT: 23 IU/L (ref 0–44)
AST: 23 IU/L (ref 0–40)
Albumin: 4.1 g/dL (ref 3.9–4.9)
Alkaline Phosphatase: 66 IU/L (ref 44–121)
Bilirubin Total: 0.3 mg/dL (ref 0.0–1.2)
Bilirubin, Direct: 0.11 mg/dL (ref 0.00–0.40)
Total Protein: 6.1 g/dL (ref 6.0–8.5)

## 2023-12-09 ENCOUNTER — Ambulatory Visit (INDEPENDENT_AMBULATORY_CARE_PROVIDER_SITE_OTHER): Admitting: Internal Medicine

## 2023-12-09 ENCOUNTER — Encounter: Payer: Self-pay | Admitting: Internal Medicine

## 2023-12-09 VITALS — BP 120/70 | HR 92 | Temp 97.8°F | Ht 71.0 in | Wt 147.4 lb

## 2023-12-09 DIAGNOSIS — K219 Gastro-esophageal reflux disease without esophagitis: Secondary | ICD-10-CM

## 2023-12-09 DIAGNOSIS — I872 Venous insufficiency (chronic) (peripheral): Secondary | ICD-10-CM | POA: Diagnosis not present

## 2023-12-09 DIAGNOSIS — E782 Mixed hyperlipidemia: Secondary | ICD-10-CM | POA: Diagnosis not present

## 2023-12-09 DIAGNOSIS — N400 Enlarged prostate without lower urinary tract symptoms: Secondary | ICD-10-CM

## 2023-12-09 DIAGNOSIS — E119 Type 2 diabetes mellitus without complications: Secondary | ICD-10-CM

## 2023-12-09 DIAGNOSIS — Z8639 Personal history of other endocrine, nutritional and metabolic disease: Secondary | ICD-10-CM

## 2023-12-09 DIAGNOSIS — I2510 Atherosclerotic heart disease of native coronary artery without angina pectoris: Secondary | ICD-10-CM

## 2023-12-09 DIAGNOSIS — R600 Localized edema: Secondary | ICD-10-CM | POA: Diagnosis not present

## 2023-12-09 LAB — GLUCOSE, POCT (MANUAL RESULT ENTRY): POC Glucose: 113 mg/dL — AB (ref 70–99)

## 2023-12-09 MED ORDER — ONE-DAILY MULTI VITAMINS PO TABS: 1.0000 | ORAL_TABLET | Freq: Every day | ORAL | 11 refills | Status: AC

## 2023-12-09 MED ORDER — PANTOPRAZOLE SODIUM 40 MG PO TBEC: 40.0000 mg | DELAYED_RELEASE_TABLET | Freq: Every day | ORAL | 11 refills | Status: AC

## 2023-12-09 MED ORDER — METFORMIN HCL 850 MG PO TABS
850.0000 mg | ORAL_TABLET | Freq: Two times a day (BID) | ORAL | 2 refills | Status: DC
Start: 2023-12-09 — End: 2024-03-24

## 2023-12-09 MED ORDER — CLOPIDOGREL BISULFATE 75 MG PO TABS: 75.0000 mg | ORAL_TABLET | Freq: Every day | ORAL | 5 refills | Status: DC

## 2023-12-09 MED ORDER — GLIMEPIRIDE 2 MG PO TABS: 2.0000 mg | ORAL_TABLET | Freq: Every morning | ORAL | 5 refills | Status: DC

## 2023-12-09 NOTE — Progress Notes (Signed)
 Established Patient Office Visit  Subjective:  Patient ID: Michael Wall, male    DOB: 1961/05/15  Age: 63 y.o. MRN: [id number]  Chief Complaint  Patient presents with   Follow-up    3 month lab results    No new complaints, here for lab review and medication refills. Admits to two hypoglycemic episodes and home bg readings have been at target.     No other concerns at this time.   Past Medical History:  Diagnosis Date   Arthritis    Coronary artery disease    Diabetes (HCC)    Dyspnea    GERD (gastroesophageal reflux disease)    HBP (high blood pressure)    Myocardial infarction (HCC)    Schizophrenia (HCC)     Past Surgical History:  Procedure Laterality Date   CARDIAC CATHETERIZATION     COLONOSCOPY WITH PROPOFOL  N/A 05/27/2017   Procedure: COLONOSCOPY WITH PROPOFOL ;  Surgeon: Cassie Click, MD;  Location: [facility] ENDOSCOPY;  Service: Endoscopy;  Laterality: N/A;   COLONOSCOPY WITH PROPOFOL  N/A 10/21/2017   Procedure: COLONOSCOPY WITH PROPOFOL ;  Surgeon: Cassie Click, MD;  Location: [facility] ENDOSCOPY;  Service: Endoscopy;  Laterality: N/A;   CORONARY ANGIOPLASTY     FINGER AMPUTATION Left    LEFT HEART CATH AND CORONARY ANGIOGRAPHY Left 01/02/2019   Procedure: LEFT HEART CATH AND CORONARY ANGIOGRAPHY;  Surgeon: Cherrie Cornwall, MD;  Location: [facility] INVASIVE CV LAB;  Service: Cardiovascular;  Laterality: Left;    Social History   Socioeconomic History   Marital status: Single    Spouse name: Not on file   Number of children: Not on file   Years of education: Not on file   Highest education level: Not on file  Occupational History   Not on file  Tobacco Use   Smoking status: Former    Current packs/day: 2.00    Average packs/day: 2.0 packs/day for 20.0 years (40.0 ttl pk-yrs)    Types: Cigarettes   Smokeless tobacco: Never  Vaping Use   Vaping status: Never Used  Substance and Sexual Activity   Alcohol use: No   Drug use: Not Currently    Types:  Marijuana   Sexual activity: Not on file  Other Topics Concern   Not on file  Social History Narrative   Not on file   Social Drivers of Health   Financial Resource Strain: Not on file  Food Insecurity: Not on file  Transportation Needs: Not on file  Physical Activity: Not on file  Stress: Not on file  Social Connections: Not on file  Intimate Partner Violence: Not on file    Family History  Problem Relation Age of Onset   Heart disease Mother    Cancer Father    Stroke Father    Heart disease Father     No Known Allergies  Outpatient Medications Prior to Visit  Medication Sig   acetaminophen  (TYLENOL ) 325 MG tablet Take 2 tablets (650 mg total) by mouth every 8 (eight) hours as needed.   aspirin  EC (ASPIRIN  LOW DOSE) 81 MG tablet TAKE 1 TABLET BY MOUTH DAILY   benztropine  (COGENTIN ) 2 MG tablet Take 2 mg by mouth 2 (two) times daily.   clopidogrel  (PLAVIX ) 75 MG tablet TAKE 1 TABLET BY MOUTH DAILY   empagliflozin  (JARDIANCE ) 25 MG TABS tablet Take 1 tablet (25 mg total) by mouth every morning.   glimepiride  (AMARYL ) 2 MG tablet TAKE 1 TABLET BY MOUTH EVERY MORNING   haloperidol  (HALDOL )  10 MG tablet Take 10 mg by mouth 3 (three) times daily.    losartan  (COZAAR ) 50 MG tablet TAKE 1 TABLET BY MOUTH DAILY   metFORMIN  (GLUCOPHAGE ) 850 MG tablet TAKE 1 TABLET BY MOUTH TWICE A DAY WITH A MEAL   metoprolol  succinate (TOPROL -XL) 100 MG 24 hr tablet TAKE 1 TABLET BY MOUTH DAILY   mirtazapine  (REMERON ) 45 MG tablet Take 45 mg by mouth at bedtime.    Multiple Vitamin (MULTIVITAMIN) tablet Take 1 tablet by mouth daily.   ONETOUCH ULTRA test strip USE WITH DEVICE TO CHECK BLOOD SUGARS TWICE DAILY   pantoprazole  (PROTONIX ) 40 MG tablet Take 1 tablet (40 mg total) by mouth daily.   QUEtiapine  (SEROQUEL ) 100 MG tablet Take 150 mg by mouth at bedtime.   rosuvastatin  (CRESTOR ) 40 MG tablet Take 1 tablet (40 mg total) by mouth every evening.   Facility-Administered Medications Prior to  Visit  Medication Dose Route Frequency Provider   sodium chloride  flush (NS) 0.9 % injection 3 mL  3 mL Intravenous Q12H Cunningham, Kristin, FNP    Review of Systems  Constitutional:  Positive for weight loss (3 lbs).  HENT: Negative.    Eyes: Negative.   Respiratory: Negative.    Cardiovascular: Negative.  Negative for chest pain and palpitations.  Gastrointestinal: Negative.   Genitourinary: Negative.   Musculoskeletal: Negative.   Skin: Negative.   Neurological: Negative.   Endo/Heme/Allergies: Negative.        Objective:   BP 120/70   Pulse 92   Temp 97.8 F (36.6 C)   Ht 5\' 11"  (1.803 m)   Wt 147 lb 6.4 oz (66.9 kg)   SpO2 (!) 81%   BMI 20.56 kg/m   Vitals:   12/09/23 1142  BP: 120/70  Pulse: 92  Temp: 97.8 F (36.6 C)  Height: 5\' 11"  (1.803 m)  Weight: 147 lb 6.4 oz (66.9 kg)  SpO2: (!) 81%  BMI (Calculated): 20.57    Physical Exam Vitals reviewed.  Constitutional:      Appearance: Normal appearance.  HENT:     Head: Normocephalic.     Left Ear: There is no impacted cerumen.     Nose: Nose normal.     Mouth/Throat:     Mouth: Mucous membranes are moist.     Pharynx: No posterior oropharyngeal erythema.  Eyes:     Extraocular Movements: Extraocular movements intact.     Pupils: Pupils are equal, round, and reactive to light.  Cardiovascular:     Rate and Rhythm: Regular rhythm.     Chest Wall: PMI is not displaced.     Pulses: Normal pulses.     Heart sounds: Normal heart sounds. No murmur heard. Pulmonary:     Effort: Pulmonary effort is normal.     Breath sounds: Normal air entry. No rhonchi or rales.  Abdominal:     General: Abdomen is flat. Bowel sounds are normal. There is no distension.     Palpations: Abdomen is soft. There is no hepatomegaly, splenomegaly or mass.     Tenderness: There is no abdominal tenderness.  Musculoskeletal:        General: Normal range of motion.     Cervical back: Normal range of motion and neck supple.      Right lower leg: Edema present.     Left lower leg: Edema present.  Skin:    General: Skin is warm and dry.  Neurological:     General: No focal deficit present.  Mental Status: He is alert and oriented to person, place, and time.     Cranial Nerves: No cranial nerve deficit.     Motor: No weakness.  Psychiatric:        Mood and Affect: Mood normal.        Behavior: Behavior normal.      Results for orders placed or performed in visit on 12/09/23  POCT Glucose (CBG)  Result Value Ref Range   POC Glucose 113 (A) 70 - 99 mg/dl    Recent Results (from the past 2160 hours)  Hepatic function panel     Status: None   Collection Time: 12/06/23  8:53 AM  Result Value Ref Range   Total Protein 6.1 6.0 - 8.5 g/dL   Albumin 4.1 3.9 - 4.9 g/dL   Bilirubin Total 0.3 0.0 - 1.2 mg/dL   Bilirubin, Direct 4.09 0.00 - 0.40 mg/dL   Alkaline Phosphatase 66 44 - 121 IU/L   AST 23 0 - 40 IU/L   ALT 23 0 - 44 IU/L  POCT Glucose (CBG)     Status: Abnormal   Collection Time: 12/09/23 11:47 AM  Result Value Ref Range   POC Glucose 113 (A) 70 - 99 mg/dl      Assessment & Plan:  As per problem list  Problem List Items Addressed This Visit       Endocrine   Diabetes (HCC)   Relevant Orders   Hemoglobin A1c     Other   History of type 2 diabetes mellitus - Primary   Relevant Orders   POCT Glucose (CBG) (Completed)   POC CREATINE & ALBUMIN,URINE   Mixed hyperlipidemia   Relevant Orders   Lipid panel   Other Visit Diagnoses       Bilateral leg edema         Venous insufficiency of both lower extremities       Relevant Orders   Compression stockings   CBC With Diff/Platelet     Benign prostatic hyperplasia without lower urinary tract symptoms       Relevant Orders   PSA       Return in about 3 months (around 03/09/2024) for cpe with labs prior.   Total time spent: 20 minutes  Arzella Bitters, MD  12/09/2023   This document may have been prepared by [facility]  Voice Recognition software and as such may include unintentional dictation errors.

## 2023-12-10 LAB — SPECIMEN STATUS REPORT

## 2023-12-19 DIAGNOSIS — H2513 Age-related nuclear cataract, bilateral: Secondary | ICD-10-CM | POA: Diagnosis not present

## 2023-12-19 DIAGNOSIS — E119 Type 2 diabetes mellitus without complications: Secondary | ICD-10-CM | POA: Diagnosis not present

## 2023-12-24 ENCOUNTER — Encounter: Payer: Self-pay | Admitting: Internal Medicine

## 2024-01-23 ENCOUNTER — Encounter: Payer: Self-pay | Admitting: Podiatry

## 2024-01-23 ENCOUNTER — Ambulatory Visit (INDEPENDENT_AMBULATORY_CARE_PROVIDER_SITE_OTHER): Admitting: Podiatry

## 2024-01-23 DIAGNOSIS — D689 Coagulation defect, unspecified: Secondary | ICD-10-CM

## 2024-01-23 DIAGNOSIS — E119 Type 2 diabetes mellitus without complications: Secondary | ICD-10-CM

## 2024-01-23 DIAGNOSIS — M2041 Other hammer toe(s) (acquired), right foot: Secondary | ICD-10-CM | POA: Diagnosis not present

## 2024-01-23 DIAGNOSIS — M2042 Other hammer toe(s) (acquired), left foot: Secondary | ICD-10-CM

## 2024-01-23 DIAGNOSIS — B351 Tinea unguium: Secondary | ICD-10-CM

## 2024-01-23 DIAGNOSIS — M79676 Pain in unspecified toe(s): Secondary | ICD-10-CM

## 2024-01-30 NOTE — Progress Notes (Signed)
 ANNUAL DIABETIC FOOT EXAM  Subjective: Michael Wall presents today for annual diabetic foot exam. Patient resides at [facility].  Patient confirms h/o diabetes.  Patient denies any h/o foot wounds.  Tejan-Sie, S Ahmed, MD is patient's PCP. LOV 12/09/2023.  Past Medical History:  Diagnosis Date   Arthritis    Coronary artery disease    Diabetes (HCC)    Dyspnea    GERD (gastroesophageal reflux disease)    HBP (high blood pressure)    Myocardial infarction (HCC)    Schizophrenia [facility])    Patient Active Problem List   Diagnosis Date Noted   Lung mass 03/04/2023   Mixed hyperlipidemia 01/02/2023   Elevated liver enzymes 08/20/2020   Protein-calorie malnutrition, severe 08/19/2020   Acute UTI 08/19/2020   AKI (acute kidney injury) (HCC) 08/19/2020   UTI (urinary tract infection) 08/18/2020   Hypotension 08/18/2020   Unstable angina (HCC) 12/25/2018   Two-vessel coronary artery disease 12/25/2018   Coronary artery disease 12/03/2017   Primary hypertension 12/03/2017   Diabetes (HCC) 12/03/2017   Ischemic colitis (HCC) 12/03/2017   Dyspnea 02/25/2017   Acute myocardial infarction (HCC) 02/25/2017   Esophageal reflux disease 02/25/2017   History of type 2 diabetes mellitus 02/25/2017   Hx of coronary artery disease 02/25/2017   Past Surgical History:  Procedure Laterality Date   CARDIAC CATHETERIZATION     COLONOSCOPY WITH PROPOFOL  N/A 05/27/2017   Procedure: COLONOSCOPY WITH PROPOFOL ;  Surgeon: Cassie Click, MD;  Location: [facility] ENDOSCOPY;  Service: Endoscopy;  Laterality: N/A;   COLONOSCOPY WITH PROPOFOL  N/A 10/21/2017   Procedure: COLONOSCOPY WITH PROPOFOL ;  Surgeon: Cassie Click, MD;  Location: [facility] ENDOSCOPY;  Service: Endoscopy;  Laterality: N/A;   CORONARY ANGIOPLASTY     FINGER AMPUTATION Left    LEFT HEART CATH AND CORONARY ANGIOGRAPHY Left 01/02/2019   Procedure: LEFT HEART CATH AND CORONARY ANGIOGRAPHY;  Surgeon: Cherrie Cornwall,  MD;  Location: [facility] INVASIVE CV LAB;  Service: Cardiovascular;  Laterality: Left;   Current Outpatient Medications on File Prior to Visit  Medication Sig Dispense Refill   acetaminophen  (TYLENOL ) 325 MG tablet Take 2 tablets (650 mg total) by mouth every 8 (eight) hours as needed.     aspirin  EC (ASPIRIN  LOW DOSE) 81 MG tablet TAKE 1 TABLET BY MOUTH DAILY 30 tablet 11   benztropine  (COGENTIN ) 2 MG tablet Take 2 mg by mouth 2 (two) times daily.     clopidogrel  (PLAVIX ) 75 MG tablet Take 1 tablet (75 mg total) by mouth daily. 30 tablet 5   empagliflozin  (JARDIANCE ) 25 MG TABS tablet Take 1 tablet (25 mg total) by mouth every morning. 90 tablet 1   glimepiride  (AMARYL ) 2 MG tablet Take 1 tablet (2 mg total) by mouth every morning. 30 tablet 5   haloperidol  (HALDOL ) 10 MG tablet Take 10 mg by mouth 3 (three) times daily.      losartan  (COZAAR ) 50 MG tablet TAKE 1 TABLET BY MOUTH DAILY 90 tablet 2   metFORMIN  (GLUCOPHAGE ) 850 MG tablet Take 1 tablet (850 mg total) by mouth 2 (two) times daily with a meal. 60 tablet 2   metoprolol  succinate (TOPROL -XL) 100 MG 24 hr tablet TAKE 1 TABLET BY MOUTH DAILY 90 tablet 3   mirtazapine  (REMERON ) 45 MG tablet Take 45 mg by mouth at bedtime.      Multiple Vitamin (MULTIVITAMIN) tablet Take 1 tablet by mouth daily. 30 tablet 11   ONETOUCH ULTRA test strip USE WITH  DEVICE TO CHECK BLOOD SUGARS TWICE DAILY 100 each 3   pantoprazole  (PROTONIX ) 40 MG tablet Take 1 tablet (40 mg total) by mouth daily. 30 tablet 11   QUEtiapine  (SEROQUEL ) 100 MG tablet Take 150 mg by mouth at bedtime.     rosuvastatin  (CRESTOR ) 40 MG tablet Take 1 tablet (40 mg total) by mouth every evening. 30 tablet 5   Current Facility-Administered Medications on File Prior to Visit  Medication Dose Route Frequency Provider Last Rate Last Admin   sodium chloride  flush (NS) 0.9 % injection 3 mL  3 mL Intravenous Q12H Cunningham, Kristin, FNP        No Known Allergies Social History    Occupational History   Not on file  Tobacco Use   Smoking status: Former    Current packs/day: 2.00    Average packs/day: 2.0 packs/day for 20.0 years (40.0 ttl pk-yrs)    Types: Cigarettes   Smokeless tobacco: Never  Vaping Use   Vaping status: Never Used  Substance and Sexual Activity   Alcohol use: No   Drug use: Not Currently    Types: Marijuana   Sexual activity: Not on file   Family History  Problem Relation Age of Onset   Heart disease Mother    Cancer Father    Stroke Father    Heart disease Father    Immunization History  Administered Date(s) Administered   Influenza-Unspecified 07/10/2023     Review of Systems: Negative except as noted in the HPI.   Objective: There were no vitals filed for this visit.  Michael Wall is a pleasant 63 y.o. male in NAD. AAO X 3.  Diabetic foot exam was performed with the following findings:   Vascular Examination: Capillary refill time immediate b/l. Palpable pedal pulses. Pedal hair present b/l. No pain with calf compression b/l. Skin temperature gradient WNL b/l. No cyanosis or clubbing b/l. No ischemia or gangrene noted b/l. No edema noted b/l LE.  Neurological Examination: Sensation grossly intact b/l with 10 gram monofilament. Vibratory sensation intact b/l.   Dermatological Examination: Pedal skin with normal turgor, texture and tone b/l.  No open wounds. No interdigital macerations.   Toenails 1-5 b/l thick, discolored, elongated with subungual debris and pain on dorsal palpation.   No corns, calluses nor porokeratotic lesions noted.  Musculoskeletal Examination: Muscle strength 5/5 to all lower extremity muscle groups bilaterally. Hammertoe(s) bilateral 2nd toes.. No pain, crepitus or joint limitation noted with ROM b/l LE.  Patient ambulates independently without assistive aids.  Radiographs: None     Lab Results  Component Value Date   HGBA1C 6.6 (H) 08/16/2023   ADA Risk Categorization: Low Risk  :  Patient has all of the following: Intact protective sensation No prior foot ulcer  No severe deformity Pedal pulses present  Assessment: 1. Pain due to onychomycosis of nail   2. Acquired hammertoes of both feet   3. Coagulation defect (HCC)   4. Diabetes mellitus without complication (HCC)   5. Encounter for diabetic foot exam (HCC)     Plan: Diabetic foot examination performed today.  All patient's and/or POA's questions/concerns addressed on today's visit. Mycotic toenails 1-5 debrided in length and girth without incident. Continue daily foot inspections and monitor blood glucose per PCP/Endocrinologist's recommendations. Continue soft, supportive shoe gear daily. Report any pedal injuries to medical professional. Call office if there are any questions/concerns. -Patient/POA to call should there be question/concern in the interim. Return in about 3 months (around 04/24/2024).  Luella Sager, DPM      [place] LOCATION: [address], [place] [address]                   Office [phone]   [facility] LOCATION: [address] [place], [place] [address] Office [phone]

## 2024-02-03 ENCOUNTER — Other Ambulatory Visit: Payer: Self-pay | Admitting: Internal Medicine

## 2024-02-07 ENCOUNTER — Ambulatory Visit (INDEPENDENT_AMBULATORY_CARE_PROVIDER_SITE_OTHER): Admitting: Cardiovascular Disease

## 2024-02-07 ENCOUNTER — Ambulatory Visit: Admitting: Internal Medicine

## 2024-02-07 ENCOUNTER — Encounter: Payer: Self-pay | Admitting: Cardiovascular Disease

## 2024-02-07 VITALS — BP 115/60 | HR 83 | Ht 71.0 in | Wt 151.6 lb

## 2024-02-07 DIAGNOSIS — E782 Mixed hyperlipidemia: Secondary | ICD-10-CM | POA: Diagnosis not present

## 2024-02-07 DIAGNOSIS — I2510 Atherosclerotic heart disease of native coronary artery without angina pectoris: Secondary | ICD-10-CM

## 2024-02-07 DIAGNOSIS — I10 Essential (primary) hypertension: Secondary | ICD-10-CM | POA: Diagnosis not present

## 2024-02-07 DIAGNOSIS — R0600 Dyspnea, unspecified: Secondary | ICD-10-CM

## 2024-02-07 DIAGNOSIS — E119 Type 2 diabetes mellitus without complications: Secondary | ICD-10-CM

## 2024-02-07 NOTE — Assessment & Plan Note (Signed)
No chest pains

## 2024-02-07 NOTE — Progress Notes (Signed)
 Cardiology Office Note   Date:  02/07/2024   ID:  Michael Wall, DOB 1961-06-10, MRN [id number]  PCP:  Albina GORMAN Dine, MD  Cardiologist:  Denyse Bathe, MD      History of Present Illness: Michael Wall is a 63 y.o. male who presents for  Chief Complaint  Patient presents with   Follow-up    3 month     Doing well      Past Medical History:  Diagnosis Date   Arthritis    Coronary artery disease    Diabetes (HCC)    Dyspnea    GERD (gastroesophageal reflux disease)    HBP (high blood pressure)    Myocardial infarction (HCC)    Schizophrenia (HCC)      Past Surgical History:  Procedure Laterality Date   CARDIAC CATHETERIZATION     COLONOSCOPY WITH PROPOFOL  N/A 05/27/2017   Procedure: COLONOSCOPY WITH PROPOFOL ;  Surgeon: Viktoria Lamar DASEN, MD;  Location: [facility] ENDOSCOPY;  Service: Endoscopy;  Laterality: N/A;   COLONOSCOPY WITH PROPOFOL  N/A 10/21/2017   Procedure: COLONOSCOPY WITH PROPOFOL ;  Surgeon: Viktoria Lamar DASEN, MD;  Location: [facility] ENDOSCOPY;  Service: Endoscopy;  Laterality: N/A;   CORONARY ANGIOPLASTY     FINGER AMPUTATION Left    LEFT HEART CATH AND CORONARY ANGIOGRAPHY Left 01/02/2019   Procedure: LEFT HEART CATH AND CORONARY ANGIOGRAPHY;  Surgeon: Bathe Denyse DELENA, MD;  Location: [facility] INVASIVE CV LAB;  Service: Cardiovascular;  Laterality: Left;     Current Outpatient Medications  Medication Sig Dispense Refill   acetaminophen  (TYLENOL ) 325 MG tablet Take 2 tablets (650 mg total) by mouth every 8 (eight) hours as needed.     aspirin  EC (ASPIRIN  LOW DOSE) 81 MG tablet TAKE 1 TABLET BY MOUTH DAILY 30 tablet 11   benztropine  (COGENTIN ) 2 MG tablet Take 2 mg by mouth 2 (two) times daily.     clopidogrel  (PLAVIX ) 75 MG tablet Take 1 tablet (75 mg total) by mouth daily. 30 tablet 5   empagliflozin  (JARDIANCE ) 25 MG TABS tablet Take 1 tablet (25 mg total) by mouth every morning. 90 tablet 1   glimepiride  (AMARYL ) 2 MG tablet Take 1 tablet (2 mg  total) by mouth every morning. 30 tablet 5   haloperidol  (HALDOL ) 10 MG tablet Take 10 mg by mouth 3 (three) times daily.      losartan  (COZAAR ) 50 MG tablet TAKE 1 TABLET BY MOUTH DAILY 90 tablet 2   metFORMIN  (GLUCOPHAGE ) 850 MG tablet Take 1 tablet (850 mg total) by mouth 2 (two) times daily with a meal. 60 tablet 2   metoprolol  succinate (TOPROL -XL) 100 MG 24 hr tablet TAKE 1 TABLET BY MOUTH DAILY 90 tablet 3   mirtazapine  (REMERON ) 45 MG tablet Take 45 mg by mouth at bedtime.      Multiple Vitamin (MULTIVITAMIN) tablet Take 1 tablet by mouth daily. 30 tablet 11   ONETOUCH ULTRA test strip USE WITH DEVICE TO CHECK BLOOD SUGARS TWICE DAILY 100 each 3   pantoprazole  (PROTONIX ) 40 MG tablet Take 1 tablet (40 mg total) by mouth daily. 30 tablet 11   QUEtiapine  (SEROQUEL ) 100 MG tablet Take 150 mg by mouth at bedtime.     rosuvastatin  (CRESTOR ) 40 MG tablet Take 1 tablet (40 mg total) by mouth every evening. 30 tablet 5   No current facility-administered medications for this visit.   Facility-Administered Medications Ordered in Other Visits  Medication Dose Route Frequency Provider Last Rate Last Admin  sodium chloride  flush (NS) 0.9 % injection 3 mL  3 mL Intravenous Q12H Cunningham, Kristin, FNP        Allergies:   Patient has no known allergies.    Social History:   reports that he has quit smoking. His smoking use included cigarettes. He has a 40 pack-year smoking history. He has never used smokeless tobacco. He reports that he does not currently use drugs after having used the following drugs: Marijuana. He reports that he does not drink alcohol.   Family History:  family history includes Cancer in his father; Heart disease in his father and mother; Stroke in his father.    ROS:     Review of Systems  Constitutional: Negative.   HENT: Negative.    Eyes: Negative.   Respiratory: Negative.    Gastrointestinal: Negative.   Genitourinary: Negative.   Musculoskeletal: Negative.    Skin: Negative.   Neurological: Negative.   Endo/Heme/Allergies: Negative.   Psychiatric/Behavioral: Negative.    All other systems reviewed and are negative.     All other systems are reviewed and negative.    PHYSICAL EXAM: VS:  BP 115/60   Pulse 83   Ht 5' 11 (1.803 m)   Wt 151 lb 9.6 oz (68.8 kg)   SpO2 100%   BMI 21.14 kg/m  , BMI Body mass index is 21.14 kg/m. Last weight:  Wt Readings from Last 3 Encounters:  02/07/24 151 lb 9.6 oz (68.8 kg)  12/09/23 147 lb 6.4 oz (66.9 kg)  11/08/23 150 lb (68 kg)     Physical Exam Vitals reviewed.  Constitutional:      Appearance: Normal appearance. He is normal weight.  HENT:     Head: Normocephalic.     Nose: Nose normal.     Mouth/Throat:     Mouth: Mucous membranes are moist.   Eyes:     Pupils: Pupils are equal, round, and reactive to light.    Cardiovascular:     Rate and Rhythm: Normal rate and regular rhythm.     Pulses: Normal pulses.     Heart sounds: Normal heart sounds.  Pulmonary:     Effort: Pulmonary effort is normal.  Abdominal:     General: Abdomen is flat. Bowel sounds are normal.   Musculoskeletal:        General: Normal range of motion.     Cervical back: Normal range of motion.   Skin:    General: Skin is warm.   Neurological:     General: No focal deficit present.     Mental Status: He is alert.   Psychiatric:        Mood and Affect: Mood normal.       EKG:   Recent Labs: 03/29/2023: BUN 23; Creatinine, Ser 0.82; Hemoglobin 12.9; Platelets 175; Potassium 4.7; Sodium 144 12/06/2023: ALT 23    Lipid Panel    Component Value Date/Time   CHOL 80 (L) 08/16/2023 0855   TRIG 77 08/16/2023 0855   HDL 29 (L) 08/16/2023 0855   CHOLHDL 2.8 08/16/2023 0855   LDLCALC 35 08/16/2023 0855      Other studies Reviewed: Additional studies/ records that were reviewed today include:  Review of the above records demonstrates:       No data to display            ASSESSMENT  AND PLAN:    ICD-10-CM   1. Coronary artery disease involving native heart without angina pectoris, unspecified vessel or lesion  type  I25.10 PCV ECHOCARDIOGRAM COMPLETE    MYOCARDIAL PERFUSION IMAGING    2. Primary hypertension  I10 PCV ECHOCARDIOGRAM COMPLETE    MYOCARDIAL PERFUSION IMAGING    3. Mixed hyperlipidemia  E78.2 PCV ECHOCARDIOGRAM COMPLETE    MYOCARDIAL PERFUSION IMAGING    4. Dyspnea, unspecified type  R06.00 PCV ECHOCARDIOGRAM COMPLETE    MYOCARDIAL PERFUSION IMAGING   Feeling better now, but had no stress test echo, so will set it up       Problem List Items Addressed This Visit       Cardiovascular and Mediastinum   Coronary artery disease - Primary   No chest pains      Relevant Orders   PCV ECHOCARDIOGRAM COMPLETE   MYOCARDIAL PERFUSION IMAGING   Primary hypertension   Relevant Orders   PCV ECHOCARDIOGRAM COMPLETE   MYOCARDIAL PERFUSION IMAGING     Other   Dyspnea   Relevant Orders   PCV ECHOCARDIOGRAM COMPLETE   MYOCARDIAL PERFUSION IMAGING   Mixed hyperlipidemia   Relevant Orders   PCV ECHOCARDIOGRAM COMPLETE   MYOCARDIAL PERFUSION IMAGING       Disposition:   Return in about 4 weeks (around 03/06/2024) for echo, stress test and f/u.    Total time spent: 35 minutes  Signed,  Denyse Bathe, MD  02/07/2024 10:28 AM    [facility]

## 2024-02-24 ENCOUNTER — Other Ambulatory Visit: Payer: Self-pay | Admitting: Cardiovascular Disease

## 2024-03-02 ENCOUNTER — Ambulatory Visit

## 2024-03-04 ENCOUNTER — Ambulatory Visit (INDEPENDENT_AMBULATORY_CARE_PROVIDER_SITE_OTHER)

## 2024-03-04 DIAGNOSIS — I10 Essential (primary) hypertension: Secondary | ICD-10-CM

## 2024-03-04 DIAGNOSIS — I361 Nonrheumatic tricuspid (valve) insufficiency: Secondary | ICD-10-CM | POA: Diagnosis not present

## 2024-03-04 DIAGNOSIS — I340 Nonrheumatic mitral (valve) insufficiency: Secondary | ICD-10-CM | POA: Diagnosis not present

## 2024-03-04 DIAGNOSIS — I371 Nonrheumatic pulmonary valve insufficiency: Secondary | ICD-10-CM | POA: Diagnosis not present

## 2024-03-04 DIAGNOSIS — E782 Mixed hyperlipidemia: Secondary | ICD-10-CM

## 2024-03-04 DIAGNOSIS — I2510 Atherosclerotic heart disease of native coronary artery without angina pectoris: Secondary | ICD-10-CM

## 2024-03-04 DIAGNOSIS — R0600 Dyspnea, unspecified: Secondary | ICD-10-CM

## 2024-03-05 ENCOUNTER — Ambulatory Visit (INDEPENDENT_AMBULATORY_CARE_PROVIDER_SITE_OTHER)

## 2024-03-05 DIAGNOSIS — I2510 Atherosclerotic heart disease of native coronary artery without angina pectoris: Secondary | ICD-10-CM

## 2024-03-05 DIAGNOSIS — R0600 Dyspnea, unspecified: Secondary | ICD-10-CM

## 2024-03-05 MED ORDER — TECHNETIUM TC 99M SESTAMIBI GENERIC - CARDIOLITE
10.7900 | Freq: Once | INTRAVENOUS | Status: AC | PRN
Start: 2024-03-05 — End: 2024-03-05
  Administered 2024-03-05: 10.79 via INTRAVENOUS

## 2024-03-05 MED ORDER — TECHNETIUM TC 99M SESTAMIBI GENERIC - CARDIOLITE
32.2000 | Freq: Once | INTRAVENOUS | Status: AC | PRN
  Administered 2024-03-05: 32.2 via INTRAVENOUS

## 2024-03-06 ENCOUNTER — Encounter: Payer: Self-pay | Admitting: Cardiovascular Disease

## 2024-03-09 ENCOUNTER — Ambulatory Visit (INDEPENDENT_AMBULATORY_CARE_PROVIDER_SITE_OTHER): Admitting: Cardiovascular Disease

## 2024-03-09 ENCOUNTER — Encounter: Payer: Self-pay | Admitting: Cardiovascular Disease

## 2024-03-09 VITALS — BP 110/76 | HR 73 | Ht 71.0 in | Wt 153.2 lb

## 2024-03-09 DIAGNOSIS — I2510 Atherosclerotic heart disease of native coronary artery without angina pectoris: Secondary | ICD-10-CM | POA: Diagnosis not present

## 2024-03-09 DIAGNOSIS — I10 Essential (primary) hypertension: Secondary | ICD-10-CM | POA: Diagnosis not present

## 2024-03-09 DIAGNOSIS — E782 Mixed hyperlipidemia: Secondary | ICD-10-CM | POA: Diagnosis not present

## 2024-03-09 NOTE — Progress Notes (Signed)
 Cardiology Office Note   Date:  03/09/2024   ID:  Michael Wall, DOB Oct 12, 1960, MRN [id number]  PCP:  Albina GORMAN Dine, MD  Cardiologist:  Denyse Bathe, MD      History of Present Illness: Michael Wall is a 63 y.o. male who presents for  Chief Complaint  Patient presents with   Follow-up    1 month nst/echo results    HPI    Past Medical History:  Diagnosis Date   Arthritis    Coronary artery disease    Diabetes (HCC)    Dyspnea    GERD (gastroesophageal reflux disease)    HBP (high blood pressure)    Myocardial infarction (HCC)    Schizophrenia (HCC)      Past Surgical History:  Procedure Laterality Date   CARDIAC CATHETERIZATION     COLONOSCOPY WITH PROPOFOL  N/A 05/27/2017   Procedure: COLONOSCOPY WITH PROPOFOL ;  Surgeon: Viktoria Lamar DASEN, MD;  Location: [facility] ENDOSCOPY;  Service: Endoscopy;  Laterality: N/A;   COLONOSCOPY WITH PROPOFOL  N/A 10/21/2017   Procedure: COLONOSCOPY WITH PROPOFOL ;  Surgeon: Viktoria Lamar DASEN, MD;  Location[facility] ENDOSCOPY;  Service: Endoscopy;  Laterality: N/A;   CORONARY ANGIOPLASTY     FINGER AMPUTATION Left    LEFT HEART CATH AND CORONARY ANGIOGRAPHY Left 01/02/2019   Procedure: LEFT HEART CATH AND CORONARY ANGIOGRAPHY;  Surgeon: Bathe Denyse DELENA, MD;  Location: [facility] INVASIVE CV LAB;  Service: Cardiovascular;  Laterality: Left;     Current Outpatient Medications  Medication Sig Dispense Refill   acetaminophen  (TYLENOL ) 325 MG tablet Take 2 tablets (650 mg total) by mouth every 8 (eight) hours as needed.     aspirin  EC (ASPIRIN  LOW DOSE) 81 MG tablet TAKE 1 TABLET BY MOUTH DAILY 30 tablet 11   benztropine  (COGENTIN ) 2 MG tablet Take 2 mg by mouth 2 (two) times daily.     clopidogrel  (PLAVIX ) 75 MG tablet Take 1 tablet (75 mg total) by mouth daily. 30 tablet 5   empagliflozin  (JARDIANCE ) 25 MG TABS tablet Take 1 tablet (25 mg total) by mouth every morning. 90 tablet 1   glimepiride  (AMARYL ) 2 MG tablet Take 1 tablet  (2 mg total) by mouth every morning. 30 tablet 5   haloperidol  (HALDOL ) 10 MG tablet Take 10 mg by mouth 3 (three) times daily.      losartan  (COZAAR ) 50 MG tablet TAKE 1 TABLET BY MOUTH DAILY 90 tablet 2   metFORMIN  (GLUCOPHAGE ) 850 MG tablet Take 1 tablet (850 mg total) by mouth 2 (two) times daily with a meal. 60 tablet 2   metoprolol  succinate (TOPROL -XL) 100 MG 24 hr tablet TAKE 1 TABLET BY MOUTH DAILY 90 tablet 3   mirtazapine  (REMERON ) 45 MG tablet Take 45 mg by mouth at bedtime.      Multiple Vitamin (MULTIVITAMIN) tablet Take 1 tablet by mouth daily. 30 tablet 11   ONETOUCH ULTRA test strip USE WITH DEVICE TO CHECK BLOOD SUGARS TWICE DAILY 100 each 3   pantoprazole  (PROTONIX ) 40 MG tablet Take 1 tablet (40 mg total) by mouth daily. 30 tablet 11   QUEtiapine  (SEROQUEL ) 100 MG tablet Take 150 mg by mouth at bedtime.     rosuvastatin  (CRESTOR ) 40 MG tablet Take 1 tablet (40 mg total) by mouth every evening. 30 tablet 5   No current facility-administered medications for this visit.   Facility-Administered Medications Ordered in Other Visits  Medication Dose Route Frequency Provider Last Rate Last Admin   sodium chloride   flush (NS) 0.9 % injection 3 mL  3 mL Intravenous Q12H Cunningham, Kristin, FNP        Allergies:   Patient has no known allergies.    Social History:   reports that he has quit smoking. His smoking use included cigarettes. He has a 40 pack-year smoking history. He has never used smokeless tobacco. He reports that he does not currently use drugs after having used the following drugs: Marijuana. He reports that he does not drink alcohol.   Family History:  family history includes Cancer in his father; Heart disease in his father and mother; Stroke in his father.    ROS:     Review of Systems  Constitutional: Negative.   HENT: Negative.    Eyes: Negative.   Respiratory: Negative.    Gastrointestinal: Negative.   Genitourinary: Negative.   Musculoskeletal:  Negative.   Skin: Negative.   Neurological: Negative.   Endo/Heme/Allergies: Negative.   Psychiatric/Behavioral: Negative.    All other systems reviewed and are negative.     All other systems are reviewed and negative.    PHYSICAL EXAM: VS:  BP 110/76   Pulse 73   Ht 5' 11 (1.803 m)   Wt 153 lb 3.2 oz (69.5 kg)   SpO2 99%   BMI 21.37 kg/m  , BMI Body mass index is 21.37 kg/m. Last weight:  Wt Readings from Last 3 Encounters:  03/09/24 153 lb 3.2 oz (69.5 kg)  02/07/24 151 lb 9.6 oz (68.8 kg)  12/09/23 147 lb 6.4 oz (66.9 kg)     Physical Exam Vitals reviewed.  Constitutional:      Appearance: Normal appearance. He is normal weight.  HENT:     Head: Normocephalic.     Nose: Nose normal.     Mouth/Throat:     Mouth: Mucous membranes are moist.  Eyes:     Pupils: Pupils are equal, round, and reactive to light.  Cardiovascular:     Rate and Rhythm: Normal rate and regular rhythm.     Pulses: Normal pulses.     Heart sounds: Normal heart sounds.  Pulmonary:     Effort: Pulmonary effort is normal.  Abdominal:     General: Abdomen is flat. Bowel sounds are normal.  Musculoskeletal:        General: Normal range of motion.     Cervical back: Normal range of motion.  Skin:    General: Skin is warm.  Neurological:     General: No focal deficit present.     Mental Status: He is alert.  Psychiatric:        Mood and Affect: Mood normal.       EKG:   Recent Labs: 03/29/2023: BUN 23; Creatinine, Ser 0.82; Hemoglobin 12.9; Platelets 175; Potassium 4.7; Sodium 144 12/06/2023: ALT 23    Lipid Panel    Component Value Date/Time   CHOL 80 (L) 08/16/2023 0855   TRIG 77 08/16/2023 0855   HDL 29 (L) 08/16/2023 0855   CHOLHDL 2.8 08/16/2023 0855   LDLCALC 35 08/16/2023 0855      Other studies Reviewed: Additional studies/ records that were reviewed today include:  Review of the above records demonstrates:       No data to display             ASSESSMENT AND PLAN:    ICD-10-CM   1. Two-vessel coronary artery disease  I25.10    has fixed defect on LAd territory with no ischaemia and no  chest pains.    2. Coronary artery disease involving native heart without angina pectoris, unspecified vessel or lesion type  I25.10     3. Primary hypertension  I10     4. Mixed hyperlipidemia  E78.2        Problem List Items Addressed This Visit       Cardiovascular and Mediastinum   Coronary artery disease   Primary hypertension   Two-vessel coronary artery disease - Primary     Other   Mixed hyperlipidemia       Disposition:   Return in about 4 months (around 07/10/2024).    Total time spent: 40 minutes  Signed,  Denyse Bathe, MD  03/09/2024 11:29 AM    [facility]

## 2024-03-10 ENCOUNTER — Ambulatory Visit: Admitting: Cardiovascular Disease

## 2024-03-24 ENCOUNTER — Other Ambulatory Visit: Payer: Self-pay

## 2024-03-24 ENCOUNTER — Other Ambulatory Visit: Payer: Self-pay | Admitting: Internal Medicine

## 2024-03-24 DIAGNOSIS — E119 Type 2 diabetes mellitus without complications: Secondary | ICD-10-CM

## 2024-04-03 ENCOUNTER — Other Ambulatory Visit

## 2024-04-03 DIAGNOSIS — E119 Type 2 diabetes mellitus without complications: Secondary | ICD-10-CM

## 2024-04-03 DIAGNOSIS — E782 Mixed hyperlipidemia: Secondary | ICD-10-CM | POA: Diagnosis not present

## 2024-04-04 LAB — HEMOGLOBIN A1C
Est. average glucose Bld gHb Est-mCnc: 143 mg/dL
Hgb A1c MFr Bld: 6.6 % — ABNORMAL HIGH (ref 4.8–5.6)

## 2024-04-04 LAB — LIPID PANEL
Chol/HDL Ratio: 2.4 ratio (ref 0.0–5.0)
Cholesterol, Total: 91 mg/dL — ABNORMAL LOW (ref 100–199)
HDL: 38 mg/dL — ABNORMAL LOW (ref >39)
LDL Chol Calc (NIH): 33 mg/dL (ref 0–99)
Triglycerides: 107 mg/dL (ref 0–149)
VLDL Cholesterol Cal: 20 mg/dL (ref 5–40)

## 2024-04-10 ENCOUNTER — Encounter: Payer: Self-pay | Admitting: Internal Medicine

## 2024-04-10 ENCOUNTER — Ambulatory Visit: Payer: Self-pay | Admitting: Internal Medicine

## 2024-04-10 ENCOUNTER — Ambulatory Visit: Admitting: Internal Medicine

## 2024-04-10 VITALS — BP 118/70 | HR 82 | Ht 71.0 in | Wt 155.4 lb

## 2024-04-10 DIAGNOSIS — Z0001 Encounter for general adult medical examination with abnormal findings: Secondary | ICD-10-CM

## 2024-04-10 DIAGNOSIS — Z8639 Personal history of other endocrine, nutritional and metabolic disease: Secondary | ICD-10-CM

## 2024-04-10 DIAGNOSIS — E782 Mixed hyperlipidemia: Secondary | ICD-10-CM | POA: Diagnosis not present

## 2024-04-10 DIAGNOSIS — E119 Type 2 diabetes mellitus without complications: Secondary | ICD-10-CM

## 2024-04-10 DIAGNOSIS — I10 Essential (primary) hypertension: Secondary | ICD-10-CM

## 2024-04-10 DIAGNOSIS — N400 Enlarged prostate without lower urinary tract symptoms: Secondary | ICD-10-CM

## 2024-04-10 LAB — POCT CBG (FASTING - GLUCOSE)-MANUAL ENTRY: Glucose Fasting, POC: 89 mg/dL (ref 70–99)

## 2024-04-10 LAB — POC CREATINE & ALBUMIN,URINE
Albumin/Creatinine Ratio, Urine, POC: <30
Creatinine, POC: 50 mg/dL
Microalbumin Ur, POC: 10 mg/L

## 2024-04-10 MED ORDER — ROSUVASTATIN CALCIUM 40 MG PO TABS: 40.0000 mg | ORAL_TABLET | Freq: Every evening | ORAL | 5 refills | Status: AC

## 2024-04-10 MED ORDER — EMPAGLIFLOZIN 25 MG PO TABS: 25.0000 mg | ORAL_TABLET | Freq: Every morning | ORAL | 1 refills | Status: DC

## 2024-04-10 NOTE — Progress Notes (Signed)
 Established Patient Office Visit  Subjective:  Patient ID: Michael Wall, male    DOB: [birth year]  Age: 63 y.o. MRN: [id number]  Chief Complaint  Patient presents with  . Annual Exam    CPE lab results    No new complaints, here for lab review and medication refills. Labs reviewed and notable for well controlled diabetes, A1c remains at target and lipids also at target . Denies any hypoglycemic episodes and home bg readings have been at target.     No other concerns at this time.   Past Medical History:  Diagnosis Date  . Arthritis   . Coronary artery disease   . Diabetes (HCC)   . Dyspnea   . GERD (gastroesophageal reflux disease)   . HBP (high blood pressure)   . Myocardial infarction (HCC)   . Schizophrenia [facility])     Past Surgical History:  Procedure Laterality Date  . CARDIAC CATHETERIZATION    . COLONOSCOPY WITH PROPOFOL  N/A 05/27/2017   Procedure: COLONOSCOPY WITH PROPOFOL ;  Surgeon: Viktoria Lamar DASEN, MD;  Location: [facility] ENDOSCOPY;  Service: Endoscopy;  Laterality: N/A;  . COLONOSCOPY WITH PROPOFOL  N/A 10/21/2017   Procedure: COLONOSCOPY WITH PROPOFOL ;  Surgeon: Viktoria Lamar DASEN, MD;  Location: [facility] ENDOSCOPY;  Service: Endoscopy;  Laterality: N/A;  . CORONARY ANGIOPLASTY    . FINGER AMPUTATION Left   . LEFT HEART CATH AND CORONARY ANGIOGRAPHY Left 01/02/2019   Procedure: LEFT HEART CATH AND CORONARY ANGIOGRAPHY;  Surgeon: Fernand Denyse DELENA, MD;  Location: [facility] INVASIVE CV LAB;  Service: Cardiovascular;  Laterality: Left;    Social History   Socioeconomic History  . Marital status: Single    Spouse name: Not on file  . Number of children: Not on file  . Years of education: Not on file  . Highest education level: Not on file  Occupational History  . Not on file  Tobacco Use  . Smoking status: Former    Current packs/day: 2.00    Average packs/day: 2.0 packs/day for 20.0 years (40.0 ttl pk-yrs)    Types: Cigarettes  . Smokeless tobacco: Never   Vaping Use  . Vaping status: Never Used  Substance and Sexual Activity  . Alcohol use: No  . Drug use: Not Currently    Types: Marijuana  . Sexual activity: Not on file  Other Topics Concern  . Not on file  Social History Narrative  . Not on file   Social Drivers of Health   Financial Resource Strain: Not on file  Food Insecurity: Not on file  Transportation Needs: Not on file  Physical Activity: Not on file  Stress: Not on file  Social Connections: Not on file  Intimate Partner Violence: Not on file    Family History  Problem Relation Age of Onset  . Heart disease Mother   . Cancer Father   . Stroke Father   . Heart disease Father     No Known Allergies  Outpatient Medications Prior to Visit  Medication Sig  . acetaminophen  (TYLENOL ) 325 MG tablet Take 2 tablets (650 mg total) by mouth every 8 (eight) hours as needed.  . aspirin  EC (ASPIRIN  LOW DOSE) 81 MG tablet TAKE 1 TABLET BY MOUTH DAILY  . benztropine  (COGENTIN ) 2 MG tablet Take 2 mg by mouth 2 (two) times daily.  . clopidogrel  (PLAVIX ) 75 MG tablet Take 1 tablet (75 mg total) by mouth daily.  . glimepiride  (AMARYL ) 2 MG tablet Take 1 tablet (2 mg total) by mouth every  morning.  . haloperidol  (HALDOL ) 10 MG tablet Take 10 mg by mouth 3 (three) times daily.   . losartan  (COZAAR ) 50 MG tablet TAKE 1 TABLET BY MOUTH DAILY  . metFORMIN  (GLUCOPHAGE ) 850 MG tablet TAKE 1 TABLET BY MOUTH 2 TIMES DAILY WITH A MEAL  . metoprolol  succinate (TOPROL -XL) 100 MG 24 hr tablet TAKE 1 TABLET BY MOUTH DAILY  . mirtazapine  (REMERON ) 45 MG tablet Take 45 mg by mouth at bedtime.   . Multiple Vitamin (MULTIVITAMIN) tablet Take 1 tablet by mouth daily.  SABRA JAN ULTRA test strip USE WITH DEVICE TO CHECK BLOOD SUGARS TWICE DAILY  . pantoprazole  (PROTONIX ) 40 MG tablet Take 1 tablet (40 mg total) by mouth daily.  . QUEtiapine  (SEROQUEL ) 100 MG tablet Take 150 mg by mouth at bedtime.  . [DISCONTINUED] empagliflozin  (JARDIANCE ) 25 MG  TABS tablet Take 1 tablet (25 mg total) by mouth every morning.  . [DISCONTINUED] rosuvastatin  (CRESTOR ) 40 MG tablet Take 1 tablet (40 mg total) by mouth every evening.   Facility-Administered Medications Prior to Visit  Medication Dose Route Frequency Provider  . sodium chloride  flush (NS) 0.9 % injection 3 mL  3 mL Intravenous Q12H Cunningham, Kristin, FNP    Review of Systems  Constitutional:  Negative for weight loss (gained 2 lbs).  HENT: Negative.    Eyes: Negative.   Respiratory: Negative.    Cardiovascular: Negative.  Negative for chest pain and palpitations.  Gastrointestinal: Negative.   Genitourinary: Negative.   Musculoskeletal: Negative.   Skin: Negative.   Neurological: Negative.   Endo/Heme/Allergies: Negative.        Objective:   BP 118/70   Pulse 82   Ht 5' 11 (1.803 m)   Wt 155 lb 6.4 oz (70.5 kg)   SpO2 99%   BMI 21.67 kg/m   Vitals:   04/10/24 1019  BP: 118/70  Pulse: 82  Height: 5' 11 (1.803 m)  Weight: 155 lb 6.4 oz (70.5 kg)  SpO2: 99%  BMI (Calculated): 21.68    Physical Exam Vitals reviewed.  Constitutional:      Appearance: Normal appearance.  HENT:     Head: Normocephalic.     Left Ear: There is no impacted cerumen.     Nose: Nose normal.     Mouth/Throat:     Mouth: Mucous membranes are moist.     Pharynx: No posterior oropharyngeal erythema.  Eyes:     Extraocular Movements: Extraocular movements intact.     Pupils: Pupils are equal, round, and reactive to light.  Cardiovascular:     Rate and Rhythm: Regular rhythm.     Chest Wall: PMI is not displaced.     Pulses: Normal pulses.     Heart sounds: Normal heart sounds. No murmur heard. Pulmonary:     Effort: Pulmonary effort is normal.     Breath sounds: Normal air entry. No rhonchi or rales.  Abdominal:     General: Abdomen is flat. Bowel sounds are normal. There is no distension.     Palpations: Abdomen is soft. There is no hepatomegaly, splenomegaly or mass.      Tenderness: There is no abdominal tenderness.  Musculoskeletal:        General: Normal range of motion.     Cervical back: Normal range of motion and neck supple.     Right lower leg: Edema present.     Left lower leg: Edema present.  Skin:    General: Skin is warm and dry.  Neurological:  General: No focal deficit present.     Mental Status: He is alert and oriented to person, place, and time.     Cranial Nerves: No cranial nerve deficit.     Motor: No weakness.  Psychiatric:        Mood and Affect: Mood normal.        Behavior: Behavior normal.      Results for orders placed or performed in visit on 04/10/24  POCT CBG (Fasting - Glucose)  Result Value Ref Range   Glucose Fasting, POC 89 70 - 99 mg/dL    Recent Results (from the past 2160 hours)  Hemoglobin A1c     Status: Abnormal   Collection Time: 04/03/24  8:50 AM  Result Value Ref Range   Hgb A1c MFr Bld 6.6 (H) 4.8 - 5.6 %    Comment:          Prediabetes: 5.7 - 6.4          Diabetes: >6.4          Glycemic control for adults with diabetes: <7.0    Est. average glucose Bld gHb Est-mCnc 143 mg/dL  Lipid panel     Status: Abnormal   Collection Time: 04/03/24  8:50 AM  Result Value Ref Range   Cholesterol, Total 91 (L) 100 - 199 mg/dL   Triglycerides 892 0 - 149 mg/dL   HDL 38 (L) >60 mg/dL   VLDL Cholesterol Cal 20 5 - 40 mg/dL   LDL Chol Calc (NIH) 33 0 - 99 mg/dL   Chol/HDL Ratio 2.4 0.0 - 5.0 ratio    Comment:                                   T. Chol/HDL Ratio                                             Men  Women                               1/2 Avg.Risk  3.4    3.3                                   Avg.Risk  5.0    4.4                                2X Avg.Risk  9.6    7.1                                3X Avg.Risk 23.4   11.0   POCT CBG (Fasting - Glucose)     Status: Normal   Collection Time: 04/10/24 10:25 AM  Result Value Ref Range   Glucose Fasting, POC 89 70 - 99 mg/dL      Assessment &  Plan:  Charlies was seen today for annual exam.  Type 2 diabetes mellitus without complication, without long-term current use of insulin  (HCC) -     POCT CBG (Fasting - Glucose) -     Empagliflozin ; Take  1 tablet (25 mg total) by mouth every morning.  Dispense: 90 tablet; Refill: 1  Encounter for general adult medical examination with abnormal findings  Mixed hyperlipidemia -     Rosuvastatin  Calcium ; Take 1 tablet (40 mg total) by mouth every evening.  Dispense: 30 tablet; Refill: 5    Problem List Items Addressed This Visit       Endocrine   Diabetes (HCC) - Primary   Relevant Medications   empagliflozin  (JARDIANCE ) 25 MG TABS tablet   rosuvastatin  (CRESTOR ) 40 MG tablet   Other Relevant Orders   POCT CBG (Fasting - Glucose) (Completed)     Other   Mixed hyperlipidemia   Relevant Medications   rosuvastatin  (CRESTOR ) 40 MG tablet   Other Visit Diagnoses       Encounter for general adult medical examination with abnormal findings           Return in 3 months (on 07/11/2024) for fu with labs prior.   Total time spent: 20 minutes  Sherrill Cinderella Perry, MD  04/10/2024   This document may have been prepared by [facility] Voice Recognition software and as such may include unintentional dictation errors.

## 2024-04-27 ENCOUNTER — Ambulatory Visit (INDEPENDENT_AMBULATORY_CARE_PROVIDER_SITE_OTHER): Admitting: Podiatry

## 2024-04-27 ENCOUNTER — Encounter: Payer: Self-pay | Admitting: Podiatry

## 2024-04-27 DIAGNOSIS — M79609 Pain in unspecified limb: Secondary | ICD-10-CM | POA: Diagnosis not present

## 2024-04-27 DIAGNOSIS — E119 Type 2 diabetes mellitus without complications: Secondary | ICD-10-CM

## 2024-04-27 DIAGNOSIS — B351 Tinea unguium: Secondary | ICD-10-CM

## 2024-04-27 NOTE — Progress Notes (Signed)
  Subjective:  Patient ID: Michael Wall, male    DOB: 1961/03/31,  MRN: [id number]  63 y.o. male presents preventative diabetic foot care for painful mycotic toenails of both feet that are difficult to trim. Pain interferes with daily activities and wearing enclosed shoe gear comfortably.  Chief Complaint  Patient presents with   [facility]    Rm1Diabetic foot care/ Dr. Cinderella Perry last visit August 2025 /A1c 6.6   New problem(s): None   PCP is Perry GORMAN Cinderella, MD.  No Known Allergies  Review of Systems: Negative except as noted in the HPI.   Objective:  Michael Wall is a pleasant 63 y.o. male WD, WN in NAD. AAO x 3.  Vascular Examination: Vascular status intact b/l with palpable pedal pulses. CFT immediate b/l. Pedal hair present. No edema. No pain with calf compression b/l. Skin temperature gradient WNL b/l. No varicosities noted. No cyanosis or clubbing noted.  Neurological Examination: Sensation grossly intact b/l with 10 gram monofilament. Vibratory sensation intact b/l.  Dermatological Examination: Pedal skin with normal turgor, texture and tone b/l. No open wounds nor interdigital macerations noted. Toenails 1-5 b/l thick, discolored, elongated with subungual debris and pain on dorsal palpation. No hyperkeratotic lesions noted b/l.   Musculoskeletal Examination: Muscle strength 5/5 to all lower extremity muscle groups bilaterally. Hammertoe(s) bilateral 2nd toes.. No pain, crepitus or joint limitation noted with ROM b/l LE.  Patient ambulates independently without assistive aids.  Radiographs: None  Last A1c:      Latest Ref Rng & Units 04/03/2024    8:50 AM 08/16/2023    8:56 AM 05/15/2023    9:27 AM  Hemoglobin A1C  Hemoglobin-A1c 4.8 - 5.6 % 6.6  6.6  7.7    Assessment:   1. Pain due to onychomycosis of nail   2. Diabetes mellitus without complication (HCC)    Plan:  Patient was evaluated and treated. All patient's and/or POA's questions/concerns  addressed on today's visit. Treatment was provided by assistant Mable FABIENE Cherry under my supervision. Toenails 1-5 debrided in length and girth without incident. Continue foot and shoe inspections daily. Monitor blood glucose per PCP/Endocrinologist's recommendations. Continue soft, supportive shoe gear daily. Report any pedal injuries to medical professional. Call office if there are any questions/concerns.  Return in about 3 months (around 07/27/2024).  Michael Wall, DPM      [place] LOCATION: [address], [place] [address]                   Office [phone]   [facility] LOCATION: [address] [place], [place] [address] Office [phone]

## 2024-04-28 ENCOUNTER — Other Ambulatory Visit: Payer: Self-pay | Admitting: Internal Medicine

## 2024-05-15 ENCOUNTER — Other Ambulatory Visit: Payer: Self-pay | Admitting: Internal Medicine

## 2024-06-01 NOTE — Progress Notes (Signed)
 ROLDAN LAFOREST                                          MRN: [id number]   06/01/2024   The [facility] Quality Team Specialist reviewed this patient medical record for the purposes of chart review for care gap closure. The following were reviewed: abstraction for care gap closure-glycemic status assessment.    VBCI Quality Team

## 2024-06-02 ENCOUNTER — Other Ambulatory Visit: Payer: Self-pay | Admitting: Internal Medicine

## 2024-06-02 DIAGNOSIS — E119 Type 2 diabetes mellitus without complications: Secondary | ICD-10-CM

## 2024-06-24 ENCOUNTER — Other Ambulatory Visit: Payer: Self-pay | Admitting: Internal Medicine

## 2024-06-30 ENCOUNTER — Other Ambulatory Visit

## 2024-06-30 LAB — HEMOGLOBIN A1C
Est. average glucose Bld gHb Est-mCnc: 166 mg/dL
Hgb A1c MFr Bld: 7.4 % — ABNORMAL HIGH (ref 4.8–5.6)

## 2024-07-01 LAB — CBC WITH DIFF/PLATELET
Basophils Absolute: 0 x10E3/uL (ref 0.0–0.2)
Basos: 1 %
EOS (ABSOLUTE): 0.2 x10E3/uL (ref 0.0–0.4)
Eos: 3 %
Hematocrit: 39.2 % (ref 37.5–51.0)
Hemoglobin: 12.3 g/dL — ABNORMAL LOW (ref 13.0–17.7)
Immature Grans (Abs): 0 x10E3/uL (ref 0.0–0.1)
Immature Granulocytes: 0 %
Lymphocytes Absolute: 2 x10E3/uL (ref 0.7–3.1)
Lymphs: 36 %
MCH: 27.6 pg (ref 26.6–33.0)
MCHC: 31.4 g/dL — ABNORMAL LOW (ref 31.5–35.7)
MCV: 88 fL (ref 79–97)
Monocytes Absolute: 0.4 x10E3/uL (ref 0.1–0.9)
Monocytes: 8 %
Neutrophils Absolute: 2.9 x10E3/uL (ref 1.4–7.0)
Neutrophils: 52 %
Platelets: 250 x10E3/uL (ref 150–450)
RBC: 4.45 x10E6/uL (ref 4.14–5.80)
RDW: 13.4 % (ref 11.6–15.4)
WBC: 5.5 x10E3/uL (ref 3.4–10.8)

## 2024-07-01 LAB — COMPREHENSIVE METABOLIC PANEL WITH GFR
ALT: 23 IU/L (ref 0–44)
AST: 20 IU/L (ref 0–40)
Albumin: 4.3 g/dL (ref 3.9–4.9)
Alkaline Phosphatase: 86 IU/L (ref 47–123)
BUN/Creatinine Ratio: 18 (ref 10–24)
BUN: 20 mg/dL (ref 8–27)
Bilirubin Total: 0.2 mg/dL (ref 0.0–1.2)
CO2: 18 mmol/L — ABNORMAL LOW (ref 20–29)
Calcium: 9.2 mg/dL (ref 8.6–10.2)
Chloride: 106 mmol/L (ref 96–106)
Creatinine, Ser: 1.14 mg/dL (ref 0.76–1.27)
Globulin, Total: 2.1 g/dL (ref 1.5–4.5)
Glucose: 134 mg/dL — ABNORMAL HIGH (ref 70–99)
Potassium: 4.3 mmol/L (ref 3.5–5.2)
Sodium: 141 mmol/L (ref 134–144)
Total Protein: 6.4 g/dL (ref 6.0–8.5)
eGFR: 72 mL/min/1.73 (ref >59)

## 2024-07-01 LAB — PSA: Prostate Specific Ag, Serum: <0.1 ng/mL (ref 0.0–4.0)

## 2024-07-01 LAB — LIPID PANEL
Chol/HDL Ratio: 2.3 ratio (ref 0.0–5.0)
Cholesterol, Total: 111 mg/dL (ref 100–199)
HDL: 49 mg/dL (ref >39)
LDL Chol Calc (NIH): 41 mg/dL (ref 0–99)
Triglycerides: 118 mg/dL (ref 0–149)
VLDL Cholesterol Cal: 21 mg/dL (ref 5–40)

## 2024-07-03 ENCOUNTER — Ambulatory Visit (INDEPENDENT_AMBULATORY_CARE_PROVIDER_SITE_OTHER): Admitting: Internal Medicine

## 2024-07-03 ENCOUNTER — Ambulatory Visit: Payer: Self-pay | Admitting: Internal Medicine

## 2024-07-03 VITALS — BP 118/62 | HR 87 | Ht 71.0 in | Wt 161.6 lb

## 2024-07-03 DIAGNOSIS — E119 Type 2 diabetes mellitus without complications: Secondary | ICD-10-CM | POA: Diagnosis not present

## 2024-07-03 DIAGNOSIS — E782 Mixed hyperlipidemia: Secondary | ICD-10-CM

## 2024-07-03 DIAGNOSIS — I10 Essential (primary) hypertension: Secondary | ICD-10-CM | POA: Diagnosis not present

## 2024-07-03 DIAGNOSIS — Z122 Encounter for screening for malignant neoplasm of respiratory organs: Secondary | ICD-10-CM

## 2024-07-03 DIAGNOSIS — E1165 Type 2 diabetes mellitus with hyperglycemia: Secondary | ICD-10-CM

## 2024-07-03 DIAGNOSIS — I2510 Atherosclerotic heart disease of native coronary artery without angina pectoris: Secondary | ICD-10-CM

## 2024-07-03 LAB — POCT CBG (FASTING - GLUCOSE)-MANUAL ENTRY: Glucose Fasting, POC: 143 mg/dL — AB (ref 70–99)

## 2024-07-03 MED ORDER — METFORMIN HCL 850 MG PO TABS: 850.0000 mg | ORAL_TABLET | Freq: Two times a day (BID) | ORAL | 2 refills | Status: AC

## 2024-07-03 MED ORDER — GLIMEPIRIDE 2 MG PO TABS: 4.0000 mg | ORAL_TABLET | Freq: Every morning | ORAL | 2 refills | Status: AC

## 2024-07-03 NOTE — Progress Notes (Signed)
 Established Patient Office Visit  Subjective:  Patient ID: Michael Wall, male    DOB: July 07, 1961  Age: 63 y.o. MRN: [id number]  Chief Complaint  Patient presents with   Follow-up    3 month follow up    No new complaints, here for lab review and medication refills. Labs reviewed and notable for uncontrolled diabetes, A1c not t target, lipids at target with unremarkable cmp and psa. Denies any hypoglycemic episodes and home bg readings have been at target.     No other concerns at this time.   Past Medical History:  Diagnosis Date   Arthritis    Coronary artery disease    Diabetes (HCC)    Dyspnea    GERD (gastroesophageal reflux disease)    HBP (high blood pressure)    Myocardial infarction (HCC)    Schizophrenia (HCC)     Past Surgical History:  Procedure Laterality Date   CARDIAC CATHETERIZATION     COLONOSCOPY WITH PROPOFOL  N/A 05/27/2017   Procedure: COLONOSCOPY WITH PROPOFOL ;  Surgeon: Viktoria Lamar DASEN, MD;  Location: [facility] ENDOSCOPY;  Service: Endoscopy;  Laterality: N/A;   COLONOSCOPY WITH PROPOFOL  N/A 10/21/2017   Procedure: COLONOSCOPY WITH PROPOFOL ;  Surgeon: Viktoria Lamar DASEN, MD;  Location: [facility] ENDOSCOPY;  Service: Endoscopy;  Laterality: N/A;   CORONARY ANGIOPLASTY     FINGER AMPUTATION Left    LEFT HEART CATH AND CORONARY ANGIOGRAPHY Left 01/02/2019   Procedure: LEFT HEART CATH AND CORONARY ANGIOGRAPHY;  Surgeon: Fernand Denyse DELENA, MD;  Location: [facility] INVASIVE CV LAB;  Service: Cardiovascular;  Laterality: Left;    Social History   Socioeconomic History   Marital status: Single    Spouse name: Not on file   Number of children: Not on file   Years of education: Not on file   Highest education level: Not on file  Occupational History   Not on file  Tobacco Use   Smoking status: Former    Current packs/day: 2.00    Average packs/day: 2.0 packs/day for 20.0 years (40.0 ttl pk-yrs)    Types: Cigarettes   Smokeless tobacco: Never  Vaping Use    Vaping status: Never Used  Substance and Sexual Activity   Alcohol use: No   Drug use: Not Currently    Types: Marijuana   Sexual activity: Not on file  Other Topics Concern   Not on file  Social History Narrative   Not on file   Social Drivers of Health   Financial Resource Strain: Not on file  Food Insecurity: Not on file  Transportation Needs: Not on file  Physical Activity: Not on file  Stress: Not on file  Social Connections: Not on file  Intimate Partner Violence: Not on file    Family History  Problem Relation Age of Onset   Heart disease Mother    Cancer Father    Stroke Father    Heart disease Father     No Known Allergies  Outpatient Medications Prior to Visit  Medication Sig   ACCU-CHEK GUIDE TEST test strip USE TO TEST BLOOD SUGAR TWICE DAILY   Accu-Chek Softclix Lancets lancets USE TO TEST BLOOD SUGAR TWICE DAILY   acetaminophen  (TYLENOL ) 325 MG tablet Take 2 tablets (650 mg total) by mouth every 8 (eight) hours as needed.   ASPIRIN  LOW DOSE 81 MG tablet TAKE 1 TABLET BY MOUTH DAILY   benztropine  (COGENTIN ) 2 MG tablet Take 2 mg by mouth 2 (two) times daily.   clopidogrel  (PLAVIX ) 75 MG  tablet Take 1 tablet (75 mg total) by mouth daily.   empagliflozin  (JARDIANCE ) 25 MG TABS tablet Take 1 tablet (25 mg total) by mouth every morning.   haloperidol  (HALDOL ) 10 MG tablet Take 10 mg by mouth 3 (three) times daily.    losartan  (COZAAR ) 50 MG tablet TAKE 1 TABLET BY MOUTH DAILY   metoprolol  succinate (TOPROL -XL) 100 MG 24 hr tablet TAKE 1 TABLET BY MOUTH DAILY   mirtazapine  (REMERON ) 45 MG tablet Take 45 mg by mouth at bedtime.    Multiple Vitamin (MULTIVITAMIN) tablet Take 1 tablet by mouth daily.   pantoprazole  (PROTONIX ) 40 MG tablet Take 1 tablet (40 mg total) by mouth daily.   QUEtiapine  (SEROQUEL ) 100 MG tablet Take 150 mg by mouth at bedtime.   rosuvastatin  (CRESTOR ) 40 MG tablet Take 1 tablet (40 mg total) by mouth every evening.   [DISCONTINUED]  glimepiride  (AMARYL ) 2 MG tablet TAKE 1 TABLET BY MOUTH EVERY MORNING   [DISCONTINUED] metFORMIN  (GLUCOPHAGE ) 850 MG tablet TAKE 1 TABLET BY MOUTH 2 TIMES DAILY WITH A MEAL   Facility-Administered Medications Prior to Visit  Medication Dose Route Frequency Provider   sodium chloride  flush (NS) 0.9 % injection 3 mL  3 mL Intravenous Q12H Cunningham, Kristin, FNP    Review of Systems  Constitutional:  Negative for weight loss (gained 6 lbs).  HENT: Negative.    Eyes: Negative.   Respiratory: Negative.    Cardiovascular: Negative.  Negative for chest pain and palpitations.  Gastrointestinal: Negative.   Genitourinary: Negative.   Musculoskeletal: Negative.   Skin: Negative.   Neurological: Negative.   Endo/Heme/Allergies: Negative.        Objective:   BP 118/62   Pulse 87   Ht 5' 11 (1.803 m)   Wt 161 lb 9.6 oz (73.3 kg)   SpO2 99%   BMI 22.54 kg/m   Vitals:   07/03/24 1050  BP: 118/62  Pulse: 87  Height: 5' 11 (1.803 m)  Weight: 161 lb 9.6 oz (73.3 kg)  SpO2: 99%  BMI (Calculated): 22.55    Physical Exam Vitals reviewed.  Constitutional:      Appearance: Normal appearance.  HENT:     Head: Normocephalic.     Left Ear: There is no impacted cerumen.     Nose: Nose normal.     Mouth/Throat:     Mouth: Mucous membranes are moist.     Pharynx: No posterior oropharyngeal erythema.  Eyes:     Extraocular Movements: Extraocular movements intact.     Pupils: Pupils are equal, round, and reactive to light.  Cardiovascular:     Rate and Rhythm: Regular rhythm.     Chest Wall: PMI is not displaced.     Pulses: Normal pulses.     Heart sounds: Normal heart sounds. No murmur heard. Pulmonary:     Effort: Pulmonary effort is normal.     Breath sounds: Normal air entry. No rhonchi or rales.  Abdominal:     General: Abdomen is flat. Bowel sounds are normal. There is no distension.     Palpations: Abdomen is soft. There is no hepatomegaly, splenomegaly or mass.      Tenderness: There is no abdominal tenderness.  Musculoskeletal:        General: Normal range of motion.     Cervical back: Normal range of motion and neck supple.     Right lower leg: Edema present.     Left lower leg: Edema present.  Skin:  General: Skin is warm and dry.  Neurological:     General: No focal deficit present.     Mental Status: He is alert and oriented to person, place, and time.     Cranial Nerves: No cranial nerve deficit.     Motor: No weakness.  Psychiatric:        Mood and Affect: Mood normal.        Behavior: Behavior normal.      Results for orders placed or performed in visit on 07/03/24  POCT CBG (Fasting - Glucose)  Result Value Ref Range   Glucose Fasting, POC 143 (A) 70 - 99 mg/dL    Recent Results (from the past 2160 hours)  POCT CBG (Fasting - Glucose)     Status: Normal   Collection Time: 04/10/24 10:25 AM  Result Value Ref Range   Glucose Fasting, POC 89 70 - 99 mg/dL  POC CREATINE & ALBUMIN,URINE     Status: Normal   Collection Time: 04/10/24 10:56 AM  Result Value Ref Range   Microalbumin Ur, POC 10 mg/L   Creatinine, POC 50 mg/dL   Albumin/Creatinine Ratio, Urine, POC <30   Hemoglobin A1c     Status: Abnormal   Collection Time: 06/30/24  8:41 AM  Result Value Ref Range   Hgb A1c MFr Bld 7.4 (H) 4.8 - 5.6 %    Comment:          Prediabetes: 5.7 - 6.4          Diabetes: >6.4          Glycemic control for adults with diabetes: <7.0    Est. average glucose Bld gHb Est-mCnc 166 mg/dL  CBC With Diff/Platelet     Status: Abnormal   Collection Time: 06/30/24  8:41 AM  Result Value Ref Range   WBC 5.5 3.4 - 10.8 x10E3/uL   RBC 4.45 4.14 - 5.80 x10E6/uL   Hemoglobin 12.3 (L) 13.0 - 17.7 g/dL   Hematocrit 60.7 62.4 - 51.0 %   MCV 88 79 - 97 fL   MCH 27.6 26.6 - 33.0 pg   MCHC 31.4 (L) 31.5 - 35.7 g/dL   RDW 86.5 88.3 - 84.5 %   Platelets 250 150 - 450 x10E3/uL   Neutrophils 52 Not Estab. %   Lymphs 36 Not Estab. %   Monocytes 8 Not  Estab. %   Eos 3 Not Estab. %   Basos 1 Not Estab. %   Neutrophils Absolute 2.9 1.4 - 7.0 x10E3/uL   Lymphocytes Absolute 2.0 0.7 - 3.1 x10E3/uL   Monocytes Absolute 0.4 0.1 - 0.9 x10E3/uL   EOS (ABSOLUTE) 0.2 0.0 - 0.4 x10E3/uL   Basophils Absolute 0.0 0.0 - 0.2 x10E3/uL   Immature Granulocytes 0 Not Estab. %   Immature Grans (Abs) 0.0 0.0 - 0.1 x10E3/uL  Comprehensive metabolic panel with GFR     Status: Abnormal   Collection Time: 06/30/24  8:41 AM  Result Value Ref Range   Glucose 134 (H) 70 - 99 mg/dL   BUN 20 8 - 27 mg/dL   Creatinine, Ser 8.85 0.76 - 1.27 mg/dL   eGFR 72 >40 fO/fpw/8.26   BUN/Creatinine Ratio 18 10 - 24   Sodium 141 134 - 144 mmol/L   Potassium 4.3 3.5 - 5.2 mmol/L   Chloride 106 96 - 106 mmol/L   CO2 18 (L) 20 - 29 mmol/L   Calcium  9.2 8.6 - 10.2 mg/dL   Total Protein 6.4 6.0 - 8.5 g/dL  Albumin 4.3 3.9 - 4.9 g/dL   Globulin, Total 2.1 1.5 - 4.5 g/dL   Bilirubin Total 0.2 0.0 - 1.2 mg/dL   Alkaline Phosphatase 86 47 - 123 IU/L   AST 20 0 - 40 IU/L   ALT 23 0 - 44 IU/L  PSA     Status: None   Collection Time: 06/30/24  8:41 AM  Result Value Ref Range   Prostate Specific Ag, Serum <0.1 0.0 - 4.0 ng/mL    Comment: Roche ECLIA methodology. According to the American Urological Association, Serum PSA should decrease and remain at undetectable levels after radical prostatectomy. The AUA defines biochemical recurrence as an initial PSA value 0.2 ng/mL or greater followed by a subsequent confirmatory PSA value 0.2 ng/mL or greater. Values obtained with different assay methods or kits cannot be used interchangeably. Results cannot be interpreted as absolute evidence of the presence or absence of malignant disease.   Lipid panel     Status: None   Collection Time: 06/30/24  8:42 AM  Result Value Ref Range   Cholesterol, Total 111 100 - 199 mg/dL   Triglycerides 881 0 - 149 mg/dL   HDL 49 >60 mg/dL   VLDL Cholesterol Cal 21 5 - 40 mg/dL   LDL Chol Calc  (NIH) 41 0 - 99 mg/dL   Chol/HDL Ratio 2.3 0.0 - 5.0 ratio    Comment:                                   T. Chol/HDL Ratio                                             Men  Women                               1/2 Avg.Risk  3.4    3.3                                   Avg.Risk  5.0    4.4                                2X Avg.Risk  9.6    7.1                                3X Avg.Risk 23.4   11.0   POCT CBG (Fasting - Glucose)     Status: Abnormal   Collection Time: 07/03/24 10:55 AM  Result Value Ref Range   Glucose Fasting, POC 143 (A) 70 - 99 mg/dL    Comment: non-fasting      Assessment & Plan:  Symir was seen today for follow-up.  Type 2 diabetes mellitus with hyperglycemia, without long-term current use of insulin  (HCC) -     POCT CBG (Fasting - Glucose) -     Fructosamine  Type 2 diabetes mellitus without complication, without long-term current use of insulin  (HCC) -     Glimepiride ; Take 2 tablets (4 mg total) by mouth every morning.  Dispense: 60 tablet; Refill: 2 -  metFORMIN  HCl; Take 1 tablet (850 mg total) by mouth 2 (two) times daily with a meal.  Dispense: 60 tablet; Refill: 2  Mixed hyperlipidemia  Primary hypertension  Coronary artery disease involving native heart without angina pectoris, unspecified vessel or lesion type    Problem List Items Addressed This Visit       Cardiovascular and Mediastinum   Coronary artery disease   Primary hypertension     Endocrine   Diabetes (HCC) - Primary   Relevant Medications   glimepiride  (AMARYL ) 2 MG tablet   metFORMIN  (GLUCOPHAGE ) 850 MG tablet   Other Relevant Orders   POCT CBG (Fasting - Glucose) (Completed)   Fructosamine     Other   Mixed hyperlipidemia    Return in about 6 weeks (around 08/14/2024) for fu with labs prior.   Total time spent: 20 minutes. This time includes review of previous notes and results and patient face to face interaction during today'Azriel Jakob visit.    Sherrill Cinderella Perry,  MD  07/03/2024   This document may have been prepared by [facility] Voice Recognition software and as such may include unintentional dictation errors.

## 2024-07-07 ENCOUNTER — Other Ambulatory Visit: Payer: Self-pay | Admitting: Internal Medicine

## 2024-07-07 DIAGNOSIS — I2510 Atherosclerotic heart disease of native coronary artery without angina pectoris: Secondary | ICD-10-CM

## 2024-07-14 ENCOUNTER — Encounter: Payer: Self-pay | Admitting: Cardiovascular Disease

## 2024-07-14 ENCOUNTER — Ambulatory Visit: Admitting: Cardiovascular Disease

## 2024-07-14 VITALS — BP 114/7 | HR 77 | Ht 71.0 in | Wt 162.2 lb

## 2024-07-14 DIAGNOSIS — I340 Nonrheumatic mitral (valve) insufficiency: Secondary | ICD-10-CM

## 2024-07-14 DIAGNOSIS — E782 Mixed hyperlipidemia: Secondary | ICD-10-CM | POA: Diagnosis not present

## 2024-07-14 DIAGNOSIS — I2510 Atherosclerotic heart disease of native coronary artery without angina pectoris: Secondary | ICD-10-CM | POA: Diagnosis not present

## 2024-07-14 DIAGNOSIS — I10 Essential (primary) hypertension: Secondary | ICD-10-CM | POA: Diagnosis not present

## 2024-07-14 NOTE — Progress Notes (Signed)
 Cardiology Office Note   Date:  07/14/2024   ID:  Michael Wall, DOB [birth year], MRN [id number]  PCP:  Albina GORMAN Dine, MD  Cardiologist:  Denyse Bathe, MD      History of Present Illness: Michael Wall is a 63 y.o. male who presents for  Chief Complaint  Patient presents with   Follow-up    4 month follow up    Feeling better, no chest pain or SOB.      Past Medical History:  Diagnosis Date   Arthritis    Coronary artery disease    Diabetes (HCC)    Dyspnea    GERD (gastroesophageal reflux disease)    HBP (high blood pressure)    Myocardial infarction (HCC)    Schizophrenia (HCC)      Past Surgical History:  Procedure Laterality Date   CARDIAC CATHETERIZATION     COLONOSCOPY WITH PROPOFOL  N/A 05/27/2017   Procedure: COLONOSCOPY WITH PROPOFOL ;  Surgeon: Viktoria Lamar DASEN, MD;  Location: [facility] ENDOSCOPY;  Service: Endoscopy;  Laterality: N/A;   COLONOSCOPY WITH PROPOFOL  N/A 10/21/2017   Procedure: COLONOSCOPY WITH PROPOFOL ;  Surgeon: Viktoria Lamar DASEN, MD;  Location: [facility] ENDOSCOPY;  Service: Endoscopy;  Laterality: N/A;   CORONARY ANGIOPLASTY     FINGER AMPUTATION Left    LEFT HEART CATH AND CORONARY ANGIOGRAPHY Left 01/02/2019   Procedure: LEFT HEART CATH AND CORONARY ANGIOGRAPHY;  Surgeon: Bathe Denyse DELENA, MD;  Location: [facility] INVASIVE CV LAB;  Service: Cardiovascular;  Laterality: Left;     Current Outpatient Medications  Medication Sig Dispense Refill   ACCU-CHEK GUIDE TEST test strip USE TO TEST BLOOD SUGAR TWICE DAILY 100 each 3   Accu-Chek Softclix Lancets lancets USE TO TEST BLOOD SUGAR TWICE DAILY 100 each 3   acetaminophen  (TYLENOL ) 325 MG tablet Take 2 tablets (650 mg total) by mouth every 8 (eight) hours as needed.     ASPIRIN  LOW DOSE 81 MG tablet TAKE 1 TABLET BY MOUTH DAILY 30 tablet 11   benztropine  (COGENTIN ) 2 MG tablet Take 2 mg by mouth 2 (two) times daily.     clopidogrel  (PLAVIX ) 75 MG tablet TAKE 1 TABLET BY MOUTH DAILY 30  tablet 5   empagliflozin  (JARDIANCE ) 25 MG TABS tablet Take 1 tablet (25 mg total) by mouth every morning. 90 tablet 1   glimepiride  (AMARYL ) 2 MG tablet Take 2 tablets (4 mg total) by mouth every morning. 60 tablet 2   haloperidol  (HALDOL ) 10 MG tablet Take 10 mg by mouth 3 (three) times daily.      losartan  (COZAAR ) 50 MG tablet TAKE 1 TABLET BY MOUTH DAILY 90 tablet 2   metFORMIN  (GLUCOPHAGE ) 850 MG tablet Take 1 tablet (850 mg total) by mouth 2 (two) times daily with a meal. 60 tablet 2   metoprolol  succinate (TOPROL -XL) 100 MG 24 hr tablet TAKE 1 TABLET BY MOUTH DAILY 90 tablet 3   mirtazapine  (REMERON ) 45 MG tablet Take 45 mg by mouth at bedtime.      Multiple Vitamin (MULTIVITAMIN) tablet Take 1 tablet by mouth daily. 30 tablet 11   pantoprazole  (PROTONIX ) 40 MG tablet Take 1 tablet (40 mg total) by mouth daily. 30 tablet 11   QUEtiapine  (SEROQUEL ) 100 MG tablet Take 150 mg by mouth at bedtime.     rosuvastatin  (CRESTOR ) 40 MG tablet Take 1 tablet (40 mg total) by mouth every evening. 30 tablet 5   No current facility-administered medications for this visit.   Facility-Administered Medications  Ordered in Other Visits  Medication Dose Route Frequency Provider Last Rate Last Admin   sodium chloride  flush (NS) 0.9 % injection 3 mL  3 mL Intravenous Q12H Cunningham, Kristin, FNP        Allergies:   Patient has no known allergies.    Social History:   reports that he has quit smoking. His smoking use included cigarettes. He has a 40 pack-year smoking history. He has never used smokeless tobacco. He reports that he does not currently use drugs after having used the following drugs: Marijuana. He reports that he does not drink alcohol.   Family History:  family history includes Cancer in his father; Heart disease in his father and mother; Stroke in his father.    ROS:     Review of Systems  Constitutional: Negative.   HENT: Negative.    Eyes: Negative.   Respiratory: Negative.     Gastrointestinal: Negative.   Genitourinary: Negative.   Musculoskeletal: Negative.   Skin: Negative.   Neurological: Negative.   Endo/Heme/Allergies: Negative.   Psychiatric/Behavioral: Negative.    All other systems reviewed and are negative.     All other systems are reviewed and negative.    PHYSICAL EXAM: VS:  BP (!) 114/7   Pulse 77   Ht 5' 11 (1.803 m)   Wt 162 lb 3.2 oz (73.6 kg)   SpO2 98%   BMI 22.62 kg/m  , BMI Body mass index is 22.62 kg/m. Last weight:  Wt Readings from Last 3 Encounters:  07/14/24 162 lb 3.2 oz (73.6 kg)  07/03/24 161 lb 9.6 oz (73.3 kg)  04/10/24 155 lb 6.4 oz (70.5 kg)     Physical Exam Vitals reviewed.  Constitutional:      Appearance: Normal appearance. He is normal weight.  HENT:     Head: Normocephalic.     Nose: Nose normal.     Mouth/Throat:     Mouth: Mucous membranes are moist.  Eyes:     Pupils: Pupils are equal, round, and reactive to light.  Cardiovascular:     Rate and Rhythm: Normal rate and regular rhythm.     Pulses: Normal pulses.     Heart sounds: Normal heart sounds.  Pulmonary:     Effort: Pulmonary effort is normal.  Abdominal:     General: Abdomen is flat. Bowel sounds are normal.  Musculoskeletal:        General: Normal range of motion.     Cervical back: Normal range of motion.  Skin:    General: Skin is warm.  Neurological:     General: No focal deficit present.     Mental Status: He is alert.  Psychiatric:        Mood and Affect: Mood normal.       EKG:   Recent Labs: 06/30/2024: ALT 23; BUN 20; Creatinine, Ser 1.14; Hemoglobin 12.3; Platelets 250; Potassium 4.3; Sodium 141    Lipid Panel    Component Value Date/Time   CHOL 111 06/30/2024 0842   TRIG 118 06/30/2024 0842   HDL 49 06/30/2024 0842   CHOLHDL 2.3 06/30/2024 0842   LDLCALC 41 06/30/2024 0842      Other studies Reviewed: Additional studies/ records that were reviewed today include:  Review of the above records  demonstrates:       No data to display            ASSESSMENT AND PLAN:    ICD-10-CM   1. Two-vessel coronary artery disease  I25.10     2. Primary hypertension  I10     3. Mixed hyperlipidemia  E78.2     4. Coronary artery disease involving native heart without angina pectoris, unspecified vessel or lesion type  I25.10     5. Nonrheumatic mitral valve regurgitation  I34.0    Normal LVEF, trace MR       Problem List Items Addressed This Visit       Cardiovascular and Mediastinum   Coronary artery disease   Primary hypertension   Two-vessel coronary artery disease - Primary     Other   Mixed hyperlipidemia   Other Visit Diagnoses       Nonrheumatic mitral valve regurgitation       Normal LVEF, trace MR          Disposition:   Return in about 4 months (around 11/12/2024).    Total time spent: 35 minutes  Signed,  Denyse Bathe, MD  07/14/2024 10:34 AM    [facility]

## 2024-07-22 ENCOUNTER — Other Ambulatory Visit: Payer: Self-pay | Admitting: Cardiovascular Disease

## 2024-07-27 ENCOUNTER — Ambulatory Visit: Admitting: Podiatry

## 2024-07-27 ENCOUNTER — Other Ambulatory Visit: Payer: Self-pay

## 2024-07-27 DIAGNOSIS — Z91199 Patient's noncompliance with other medical treatment and regimen due to unspecified reason: Secondary | ICD-10-CM

## 2024-07-27 NOTE — Progress Notes (Signed)
 1. No-show for appointment

## 2024-08-04 DIAGNOSIS — E119 Type 2 diabetes mellitus without complications: Secondary | ICD-10-CM

## 2024-08-04 MED ORDER — EMPAGLIFLOZIN 25 MG PO TABS: 25.0000 mg | ORAL_TABLET | Freq: Every morning | ORAL | 1 refills | Status: AC

## 2024-08-14 ENCOUNTER — Ambulatory Visit: Admitting: Internal Medicine

## 2024-08-14 VITALS — BP 122/68 | HR 87 | Temp 98.4°F | Ht 71.0 in | Wt 160.6 lb

## 2024-08-14 LAB — POCT CBG (FASTING - GLUCOSE)-MANUAL ENTRY: Glucose Fasting, POC: 179 mg/dL — AB (ref 70–99)

## 2024-08-17 ENCOUNTER — Ambulatory Visit: Admitting: Internal Medicine

## 2024-08-24 ENCOUNTER — Ambulatory Visit (INDEPENDENT_AMBULATORY_CARE_PROVIDER_SITE_OTHER): Admitting: Internal Medicine

## 2024-08-24 VITALS — BP 112/62 | HR 81 | Temp 97.4°F | Ht 71.0 in | Wt 165.0 lb

## 2024-08-24 DIAGNOSIS — E1165 Type 2 diabetes mellitus with hyperglycemia: Secondary | ICD-10-CM | POA: Diagnosis not present

## 2024-08-24 DIAGNOSIS — Z87891 Personal history of nicotine dependence: Secondary | ICD-10-CM | POA: Diagnosis not present

## 2024-08-24 DIAGNOSIS — E782 Mixed hyperlipidemia: Secondary | ICD-10-CM | POA: Diagnosis not present

## 2024-08-24 DIAGNOSIS — E119 Type 2 diabetes mellitus without complications: Secondary | ICD-10-CM

## 2024-08-24 DIAGNOSIS — Z0130 Encounter for examination of blood pressure without abnormal findings: Secondary | ICD-10-CM

## 2024-08-24 LAB — POCT CBG (FASTING - GLUCOSE)-MANUAL ENTRY: Glucose Fasting, POC: 120 mg/dL — AB (ref 70–99)

## 2024-08-24 NOTE — Progress Notes (Signed)
 "  Established Patient Office Visit  Subjective:  Patient ID: Michael Wall, male    DOB: [birth year]  Age: 64 y.o. MRN: [id number]  Chief Complaint  Patient presents with   Follow-up    6 weeks follow up    No new complaints, here for lab review and medication refills. Failed to have previsit labs done.     No other concerns at this time.   Past Medical History:  Diagnosis Date   Arthritis    Coronary artery disease    Diabetes (HCC)    Dyspnea    GERD (gastroesophageal reflux disease)    HBP (high blood pressure)    Myocardial infarction (HCC)    Schizophrenia (HCC)     Past Surgical History:  Procedure Laterality Date   CARDIAC CATHETERIZATION     COLONOSCOPY WITH PROPOFOL  N/A 05/27/2017   Procedure: COLONOSCOPY WITH PROPOFOL ;  Surgeon: Viktoria Lamar DASEN, MD;  Location: [facility] ENDOSCOPY;  Service: Endoscopy;  Laterality: N/A;   COLONOSCOPY WITH PROPOFOL  N/A 10/21/2017   Procedure: COLONOSCOPY WITH PROPOFOL ;  Surgeon: Viktoria Lamar DASEN, MD;  Location: [facility] ENDOSCOPY;  Service: Endoscopy;  Laterality: N/A;   CORONARY ANGIOPLASTY     FINGER AMPUTATION Left    LEFT HEART CATH AND CORONARY ANGIOGRAPHY Left 01/02/2019   Procedure: LEFT HEART CATH AND CORONARY ANGIOGRAPHY;  Surgeon: Fernand Denyse DELENA, MD;  Location: [facility] INVASIVE CV LAB;  Service: Cardiovascular;  Laterality: Left;    Social History   Socioeconomic History   Marital status: Single    Spouse name: Not on file   Number of children: Not on file   Years of education: Not on file   Highest education level: Not on file  Occupational History   Not on file  Tobacco Use   Smoking status: Former    Current packs/day: 2.00    Average packs/day: 2.0 packs/day for 20.0 years (40.0 ttl pk-yrs)    Types: Cigarettes   Smokeless tobacco: Never  Vaping Use   Vaping status: Never Used  Substance and Sexual Activity   Alcohol use: No   Drug use: Not Currently    Types: Marijuana   Sexual activity: Not on file   Other Topics Concern   Not on file  Social History Narrative   Not on file   Social Drivers of Health   Tobacco Use: Medium Risk (07/14/2024)   Patient History    Smoking Tobacco Use: Former    Smokeless Tobacco Use: Never    Passive Exposure: Not on Actuary Strain: Not on file  Food Insecurity: Not on file  Transportation Needs: Not on file  Physical Activity: Not on file  Stress: Not on file  Social Connections: Not on file  Intimate Partner Violence: Not on file  Depression (PHQ2-9): Low Risk (04/10/2024)   Depression (PHQ2-9)    PHQ-2 Score: 0  Alcohol Screen: Not on file  Housing: Not on file  Utilities: Not on file  Health Literacy: Not on file    Family History  Problem Relation Age of Onset   Heart disease Mother    Cancer Father    Stroke Father    Heart disease Father     Allergies[1]  Show/hide medication list[2]  Review of Systems  Constitutional:  Negative for weight loss (gained 6 lbs).  HENT: Negative.    Eyes: Negative.   Respiratory: Negative.    Cardiovascular: Negative.  Negative for chest pain and palpitations.  Gastrointestinal: Negative.   Genitourinary: Negative.  Musculoskeletal: Negative.   Skin: Negative.   Neurological: Negative.   Endo/Heme/Allergies: Negative.        Objective:   BP 112/62   Pulse 81   Temp (!) 97.4 F (36.3 C) (Tympanic)   Ht 5' 11 (1.803 m)   Wt 165 lb (74.8 kg)   SpO2 99%   BMI 23.01 kg/m   Vitals:   08/24/24 1147  BP: 112/62  Pulse: 81  Temp: (!) 97.4 F (36.3 C)  Height: 5' 11 (1.803 m)  Weight: 165 lb (74.8 kg)  SpO2: 99%  TempSrc: Tympanic  BMI (Calculated): 23.02    Physical Exam   Results for orders placed or performed in visit on 08/24/24  POCT CBG (Fasting - Glucose)  Result Value Ref Range   Glucose Fasting, POC 120 (A) 70 - 99 mg/dL    Recent Results (from the past 2160 hours)  Hemoglobin A1c     Status: Abnormal   Collection Time: 06/30/24  8:41  AM  Result Value Ref Range   Hgb A1c MFr Bld 7.4 (H) 4.8 - 5.6 %    Comment:          Prediabetes: 5.7 - 6.4          Diabetes: >6.4          Glycemic control for adults with diabetes: <7.0    Est. average glucose Bld gHb Est-mCnc 166 mg/dL  CBC With Diff/Platelet     Status: Abnormal   Collection Time: 06/30/24  8:41 AM  Result Value Ref Range   WBC 5.5 3.4 - 10.8 x10E3/uL   RBC 4.45 4.14 - 5.80 x10E6/uL   Hemoglobin 12.3 (L) 13.0 - 17.7 g/dL   Hematocrit 60.7 62.4 - 51.0 %   MCV 88 79 - 97 fL   MCH 27.6 26.6 - 33.0 pg   MCHC 31.4 (L) 31.5 - 35.7 g/dL   RDW 86.5 88.3 - 84.5 %   Platelets 250 150 - 450 x10E3/uL   Neutrophils 52 Not Estab. %   Lymphs 36 Not Estab. %   Monocytes 8 Not Estab. %   Eos 3 Not Estab. %   Basos 1 Not Estab. %   Neutrophils Absolute 2.9 1.4 - 7.0 x10E3/uL   Lymphocytes Absolute 2.0 0.7 - 3.1 x10E3/uL   Monocytes Absolute 0.4 0.1 - 0.9 x10E3/uL   EOS (ABSOLUTE) 0.2 0.0 - 0.4 x10E3/uL   Basophils Absolute 0.0 0.0 - 0.2 x10E3/uL   Immature Granulocytes 0 Not Estab. %   Immature Grans (Abs) 0.0 0.0 - 0.1 x10E3/uL  Comprehensive metabolic panel with GFR     Status: Abnormal   Collection Time: 06/30/24  8:41 AM  Result Value Ref Range   Glucose 134 (H) 70 - 99 mg/dL   BUN 20 8 - 27 mg/dL   Creatinine, Ser 8.85 0.76 - 1.27 mg/dL   eGFR 72 >40 fO/fpw/8.26   BUN/Creatinine Ratio 18 10 - 24   Sodium 141 134 - 144 mmol/L   Potassium 4.3 3.5 - 5.2 mmol/L   Chloride 106 96 - 106 mmol/L   CO2 18 (L) 20 - 29 mmol/L   Calcium  9.2 8.6 - 10.2 mg/dL   Total Protein 6.4 6.0 - 8.5 g/dL   Albumin 4.3 3.9 - 4.9 g/dL   Globulin, Total 2.1 1.5 - 4.5 g/dL   Bilirubin Total 0.2 0.0 - 1.2 mg/dL   Alkaline Phosphatase 86 47 - 123 IU/L   AST 20 0 - 40 IU/L   ALT 23  0 - 44 IU/L  PSA     Status: None   Collection Time: 06/30/24  8:41 AM  Result Value Ref Range   Prostate Specific Ag, Serum <0.1 0.0 - 4.0 ng/mL    Comment: Roche ECLIA methodology. According to the  American Urological Association, Serum PSA should decrease and remain at undetectable levels after radical prostatectomy. The AUA defines biochemical recurrence as an initial PSA value 0.2 ng/mL or greater followed by a subsequent confirmatory PSA value 0.2 ng/mL or greater. Values obtained with different assay methods or kits cannot be used interchangeably. Results cannot be interpreted as absolute evidence of the presence or absence of malignant disease.   Lipid panel     Status: None   Collection Time: 06/30/24  8:42 AM  Result Value Ref Range   Cholesterol, Total 111 100 - 199 mg/dL   Triglycerides 881 0 - 149 mg/dL   HDL 49 >60 mg/dL   VLDL Cholesterol Cal 21 5 - 40 mg/dL   LDL Chol Calc (NIH) 41 0 - 99 mg/dL   Chol/HDL Ratio 2.3 0.0 - 5.0 ratio    Comment:                                   T. Chol/HDL Ratio                                             Men  Women                               1/2 Avg.Risk  3.4    3.3                                   Avg.Risk  5.0    4.4                                2X Avg.Risk  9.6    7.1                                3X Avg.Risk 23.4   11.0   POCT CBG (Fasting - Glucose)     Status: Abnormal   Collection Time: 07/03/24 10:55 AM  Result Value Ref Range   Glucose Fasting, POC 143 (A) 70 - 99 mg/dL    Comment: non-fasting  POCT CBG (Fasting - Glucose)     Status: Abnormal   Collection Time: 08/14/24 10:13 AM  Result Value Ref Range   Glucose Fasting, POC 179 (A) 70 - 99 mg/dL  POCT CBG (Fasting - Glucose)     Status: Abnormal   Collection Time: 08/24/24 11:52 AM  Result Value Ref Range   Glucose Fasting, POC 120 (A) 70 - 99 mg/dL      Assessment & Plan:   Problem List Items Addressed This Visit       Endocrine   Diabetes (HCC) - Primary   Relevant Orders   POCT CBG (Fasting - Glucose) (Completed)     Other   Mixed hyperlipidemia   Other Visit Diagnoses  History of tobacco abuse           Return in about 6 weeks  (around 10/05/2024) for fu with labs prior.   Total time spent: 20 minutes. This time includes review of previous notes and results and patient face to face interaction during today'Stephane Niemann visit.    Sherrill Cinderella Perry, MD  08/24/2024   This document may have been prepared by [facility] Voice Recognition software and as such may include unintentional dictation errors.      [1] No Known Allergies [2]  Outpatient Medications Prior to Visit  Medication Sig   ACCU-CHEK GUIDE TEST test strip USE TO TEST BLOOD SUGAR TWICE DAILY   Accu-Chek Softclix Lancets lancets USE TO TEST BLOOD SUGAR TWICE DAILY   acetaminophen  (TYLENOL ) 325 MG tablet Take 2 tablets (650 mg total) by mouth every 8 (eight) hours as needed.   ASPIRIN  LOW DOSE 81 MG tablet TAKE 1 TABLET BY MOUTH DAILY   benztropine  (COGENTIN ) 2 MG tablet Take 2 mg by mouth 2 (two) times daily.   clopidogrel  (PLAVIX ) 75 MG tablet TAKE 1 TABLET BY MOUTH DAILY   empagliflozin  (JARDIANCE ) 25 MG TABS tablet Take 1 tablet (25 mg total) by mouth every morning.   glimepiride  (AMARYL ) 2 MG tablet Take 2 tablets (4 mg total) by mouth every morning.   haloperidol  (HALDOL ) 10 MG tablet Take 10 mg by mouth 3 (three) times daily.    losartan  (COZAAR ) 50 MG tablet TAKE 1 TABLET BY MOUTH DAILY   metFORMIN  (GLUCOPHAGE ) 850 MG tablet Take 1 tablet (850 mg total) by mouth 2 (two) times daily with a meal.   metoprolol  succinate (TOPROL -XL) 100 MG 24 hr tablet TAKE 1 TABLET BY MOUTH DAILY   mirtazapine  (REMERON ) 45 MG tablet Take 45 mg by mouth at bedtime.    Multiple Vitamin (MULTIVITAMIN) tablet Take 1 tablet by mouth daily.   pantoprazole  (PROTONIX ) 40 MG tablet Take 1 tablet (40 mg total) by mouth daily.   QUEtiapine  (SEROQUEL ) 100 MG tablet Take 150 mg by mouth at bedtime.   rosuvastatin  (CRESTOR ) 40 MG tablet Take 1 tablet (40 mg total) by mouth every evening.   Facility-Administered Medications Prior to Visit  Medication Dose Route Frequency Provider    sodium chloride  flush (NS) 0.9 % injection 3 mL  3 mL Intravenous Q12H Cunningham, Kristin, FNP   "

## 2024-08-25 LAB — FRUCTOSAMINE: Fructosamine: 301 umol/L — ABNORMAL HIGH (ref 0–285)

## 2024-09-01 ENCOUNTER — Ambulatory Visit: Payer: Self-pay | Admitting: Internal Medicine

## 2024-09-01 ENCOUNTER — Other Ambulatory Visit: Payer: Self-pay | Admitting: Internal Medicine

## 2024-09-01 DIAGNOSIS — E119 Type 2 diabetes mellitus without complications: Secondary | ICD-10-CM

## 2024-09-01 MED ORDER — PIOGLITAZONE HCL 15 MG PO TABS: 15.0000 mg | ORAL_TABLET | Freq: Every day | ORAL | 1 refills | Status: AC

## 2024-09-09 NOTE — Progress Notes (Signed)
 Left vm

## 2024-10-12 ENCOUNTER — Ambulatory Visit: Admitting: Internal Medicine

## 2024-11-12 ENCOUNTER — Ambulatory Visit: Admitting: Cardiovascular Disease
